# Patient Record
Sex: Female | Born: 1960 | Race: White | Marital: Married | Smoking: Current every day smoker
Health system: Northeastern US, Academic
[De-identification: ages and names within clinical notes are randomized; demographics above are authoritative.]

## PROBLEM LIST (undated history)

## (undated) DIAGNOSIS — M1990 Unspecified osteoarthritis, unspecified site: Secondary | ICD-10-CM

## (undated) DIAGNOSIS — G8929 Other chronic pain: Secondary | ICD-10-CM

## (undated) DIAGNOSIS — E7800 Pure hypercholesterolemia, unspecified: Secondary | ICD-10-CM

## (undated) DIAGNOSIS — K140 Glossitis: Secondary | ICD-10-CM

## (undated) DIAGNOSIS — M545 Low back pain, unspecified: Secondary | ICD-10-CM

## (undated) DIAGNOSIS — G35 Multiple sclerosis: Secondary | ICD-10-CM

## (undated) DIAGNOSIS — E039 Hypothyroidism, unspecified: Secondary | ICD-10-CM

## (undated) DIAGNOSIS — J45909 Unspecified asthma, uncomplicated: Secondary | ICD-10-CM

## (undated) DIAGNOSIS — I82409 Acute embolism and thrombosis of unspecified deep veins of unspecified lower extremity: Secondary | ICD-10-CM

## (undated) DIAGNOSIS — F32A Depression, unspecified: Secondary | ICD-10-CM

## (undated) DIAGNOSIS — R1310 Dysphagia, unspecified: Secondary | ICD-10-CM

## (undated) DIAGNOSIS — I519 Heart disease, unspecified: Secondary | ICD-10-CM

## (undated) DIAGNOSIS — K219 Gastro-esophageal reflux disease without esophagitis: Secondary | ICD-10-CM

## (undated) DIAGNOSIS — Z720 Tobacco use: Secondary | ICD-10-CM

## (undated) DIAGNOSIS — G35B0 Primary progressive multiple sclerosis, unspecified: Secondary | ICD-10-CM

## (undated) DIAGNOSIS — I10 Essential (primary) hypertension: Secondary | ICD-10-CM

## (undated) DIAGNOSIS — G35D Multiple sclerosis, unspecified: Secondary | ICD-10-CM

## (undated) DIAGNOSIS — E876 Hypokalemia: Secondary | ICD-10-CM

## (undated) DIAGNOSIS — J449 Chronic obstructive pulmonary disease, unspecified: Secondary | ICD-10-CM

## (undated) HISTORY — DX: Heart disease, unspecified: I51.9

## (undated) HISTORY — DX: Glossitis: K14.0

## (undated) HISTORY — PX: OTHER SURGICAL HISTORY: SHX169

## (undated) HISTORY — DX: Chronic obstructive pulmonary disease, unspecified: J44.9

## (undated) HISTORY — PX: TONSILLECTOMY AND ADENOIDECTOMY: SHX28

## (undated) HISTORY — PX: APPENDECTOMY: SHX54

## (undated) HISTORY — DX: Tobacco use: Z72.0

## (undated) HISTORY — DX: Gastro-esophageal reflux disease without esophagitis: K21.9

## (undated) HISTORY — DX: Unspecified osteoarthritis, unspecified site: M19.90

## (undated) HISTORY — DX: Hypothyroidism, unspecified: E03.9

## (undated) HISTORY — DX: Acute embolism and thrombosis of unspecified deep veins of unspecified lower extremity: I82.409

## (undated) HISTORY — PX: HIP REPLACEMENT: SHX530A

## (undated) HISTORY — DX: Dysphagia, unspecified: R13.10

## (undated) HISTORY — PX: COLONOSCOPY: SHX174

## (undated) HISTORY — PX: SPINE SURGERY: SHX786

## (undated) HISTORY — PX: VARICOSE VEIN SURGERY: SHX832

## (undated) HISTORY — DX: Multiple sclerosis: G35

## (undated) HISTORY — DX: Multiple sclerosis, unspecified: G35.D

---

## 1991-05-10 HISTORY — PX: OTHER SURGICAL HISTORY: SHX169

## 1999-09-08 ENCOUNTER — Encounter: Payer: Self-pay | Admitting: Gastroenterology

## 2007-08-15 ENCOUNTER — Encounter: Payer: Self-pay | Admitting: Cardiovascular Disease

## 2007-08-16 ENCOUNTER — Encounter: Payer: Self-pay | Admitting: Cardiovascular Disease

## 2009-03-31 ENCOUNTER — Encounter: Payer: Self-pay | Admitting: Cardiovascular Disease

## 2009-03-31 ENCOUNTER — Emergency Department: Admit: 2009-03-31 | Disposition: A | Discharge status: Home / Self Care | Payer: Self-pay | Source: Ambulatory Visit

## 2009-03-31 LAB — URINE MICROSCOPIC (IQ200)
RBC,UA: 1 /HPF (ref 0–2)
WBC,UA: 9 /HPF — AB (ref 0–5)

## 2009-03-31 LAB — CBC AND DIFFERENTIAL
Baso # K/uL: 0.1 THOU/uL (ref 0.0–0.1)
Basophil %: 0.7 % (ref 0.1–1.2)
Eos # K/uL: 0.3 THOU/uL (ref 0.0–0.4)
Eosinophil %: 3.6 % (ref 0.7–5.8)
Hematocrit: 39 % (ref 34–45)
Hemoglobin: 13.4 g/dL (ref 11.2–15.7)
Lymph # K/uL: 2.9 THOU/uL (ref 1.2–3.7)
Lymphocyte %: 39.2 % (ref 19.3–51.7)
MCV: 101 fL — ABNORMAL HIGH (ref 79–95)
Mono # K/uL: 0.4 THOU/uL (ref 0.2–0.9)
Monocyte %: 4.9 % (ref 4.7–12.5)
Neut # K/uL: 3.8 THOU/uL (ref 1.6–6.1)
Platelets: 317 THOU/uL (ref 160–370)
RBC: 3.9 MIL/uL (ref 3.9–5.2)
RDW: 13.5 % (ref 11.7–14.4)
Seg Neut %: 51.6 % (ref 34.0–71.1)
WBC: 7.3 THOU/uL (ref 4.0–10.0)

## 2009-03-31 LAB — PLASMA PROF 7 (ED ONLY)
Anion Gap,PL: 9 (ref 7–16)
CO2,Plasma: 22 mmol/L (ref 20–28)
Chloride,Plasma: 107 mmol/L (ref 96–108)
Creatinine: 0.58 mg/dL (ref 0.51–0.95)
GFR,Black: >59 *
GFR,Caucasian: >59 *
Glucose,Plasma: 89 mg/dL (ref 74–106)
Potassium,Plasma: 3.6 mmol/L (ref 3.4–4.7)
Sodium,Plasma: 138 mmol/L (ref 132–146)
UN,Plasma: 21 mg/dL — ABNORMAL HIGH (ref 6–20)

## 2009-03-31 LAB — URINALYSIS WITH REFLEX TO MICROSCOPIC
Blood,UA: NEGATIVE
Ketones, UA: NEGATIVE
Nitrite,UA: NEGATIVE
Protein,UA: NEGATIVE mg/dL
Specific Gravity,UA: 1.023 (ref 1.002–1.030)
pH,UA: 5.5 (ref 5.0–8.0)

## 2009-03-31 LAB — DRUG SCREEN CHEMICAL DEPENDENCY, URINE
Amphetamine,UR: NEGATIVE
Benzodiazepinen,UR: NEGATIVE
Cocaine/Metab,UR: NEGATIVE
Opiates,UR: POSITIVE
THC Metabolite,UR: NEGATIVE

## 2009-03-31 LAB — RUQ PANEL (ED ONLY)
ALT: 22 U/L (ref 0–35)
AST: 18 U/L (ref 0–35)
Albumin: 4.5 g/dL (ref 3.5–5.2)
Alk Phos: 57 U/L (ref 35–105)
Amylase: 38 U/L (ref 28–100)
Bilirubin,Direct: <0.2 mg/dL (ref 0.0–0.3)
Bilirubin,Total: 0.4 mg/dL (ref 0.0–1.2)
Lipase: 37 U/L (ref 13–60)
Total Protein: 6.6 g/dL (ref 6.3–7.7)

## 2009-03-31 LAB — ACETAMINOPHEN LEVEL: Acetaminophen: <1 ug/mL

## 2009-03-31 LAB — POCT URINE PREGNANCY (ED ONLY): Preg,UR POCT: NEGATIVE m[IU]/mL

## 2009-03-31 LAB — SALICYLATE LEVEL: Salicylate: <2 mg/dL — ABNORMAL LOW (ref 15.0–30.0)

## 2009-03-31 LAB — ETHANOL: Ethanol: <10 mg/dL

## 2009-04-01 LAB — CONFIRM OPIATES: Confirm Opiates: POSITIVE

## 2009-04-01 LAB — HOLD LAVENDER

## 2009-04-01 LAB — HOLD RED: Hold Red: 1

## 2009-04-01 LAB — SALICYLATE LEVEL: Salicylate: <2 mg/dL — ABNORMAL LOW (ref 15.0–30.0)

## 2009-04-01 LAB — ACETAMINOPHEN LEVEL: Acetaminophen: <1 ug/mL

## 2009-04-07 NOTE — ED Provider Notes (Signed)
VISIT NUMBER:  [id number].    I saw and evaluated the patient.  I agree with the resident's/fellow's  finding and plan of care as documented.  Details of my evaluation are as  follows:    CHIEF COMPLAINT:  Fatigue.    HPI:  A 48 year old arrived at the ER fatigued and somnolent.  She was at  work upstairs in the ICU.  She stated to myself that she felt fine this  morning on her way into work.  She felt fine last night.  She was quite  sleepy, falling asleep mid sentences, and able to answer most questions  with short answers.  She denies any understanding why she is so sleepy.  She denies any ingestions, specifically denies any could or flu  medications.    The was an unopened bottle of IV lorazepam in her pocket.  Other details  about work exposure and work medications are unknown to myself.    She does share with myself that she lives at home with husband and 2  stepchildren.  She shares that her husband is away in [place] and can  be reached at [phone].  I tried the above number unsuccessfully.    ALLERGIES:  To Compazine.  MEDICATIONS:  After sharing, "I'm trying to think," she shared that there  are no prescription medications.    PAST MEDICAL / SURGICAL HISTORY:  Perianal repair, colostomy status post  reversal in 1993, chronic hypokalemia, hypertension, osteoarthritis,  hypercholesterolemia, and chronic low back pain.    FAMILY HISTORY:  None.    SOCIAL HISTORY:  Two-pack per day tobacco.  No street drugs.  As mentioned,  lives with her husband and 2 children; unknown what age the children are.    REVIEW OF SYSTEMS:  Positive for the above, otherwise negative x10.    PHYSICAL EXAMINATION:  Temperature 36.3.  Respiratory rate 18.  Pulse 74.  Blood pressure 107/74.  Pulse oximetry 97% on 2 L.  General:  The patient is quite somnolent and difficult to arouse.  At best,  she is able to lean up on 1 elbow, open her eyes to speech, and try to  cooperate with the physical.  Head:  Normocephalic.  Eyes:  Pupils  equal to light, react.  Oropharynx:  Dry.  Neck:  Supple.  Lungs:  Clear to auscultation, unlabored.  Heart:  S1, S2 with no murmurs, rubs, gallops or heaves.  Extremities:  Warm and perfused.  Abdomen:  Soft, nondistended, nontender, no masses, no guarding, no  rigidity.  Extremities:  Moves all extremities well with no edema.  Neurologic:  Moves extremities spontaneously and well.  Psychiatric:  Alert with normal affect and normal mood.  Skin:  I am unable to check any track marks on her hands or forearms.    MEDICAL DECISION MAKING/CONDITION:  Labs pending, including a CBC, Chem-7,  right upper quadrant, urine drug abuse screen, aspirin, Tylenol, EtOH, and  head CT.    Currently, we are treating this as a presumed ingestion.  It will be  helpful to see if the opioid and/or benzodiazepines are detectable, as  either of these could be a cause for her somnolence.  I will be attempting  some Narcan to see if this has any effect.  If it does, it will be noted in  an addendum.    I will be readdressing her issues in an addendum when the labs and images  come back.    We also will be addressing her  mental status unless a reversible and  identifiable cause is discovered.  She is not safe to drive herself home  and at present, is not safe to consent to go home.  DDX:  PLAN:    DATA:  LABS:  RADIOLOGIC STUDIES:  ECG:  RHYTHM STRIPS:    PROCEDURE (Attestation):  CRITICAL CARE TIME (Time to perform separately billable procedures  subtracted from CC time):  CONSULT:  PCP NOTIFIED:  SMOKING CESSATION COUNSELING:    FOLLOW-UP NOTE AND DISPOSITION:    ED DIAGNOSIS:  Somnolence.                                      Electronically Signed and Finalized  by  Danie Binder, MD 04/07/2009 00:39  ___________________________________________  Danie Binder, MD      DD:   03/31/2009  DT:   03/31/2009  2:23 P  GN/FA2#[id number]  [id number]    cc:   Kateri Plummer, MD

## 2013-10-20 ENCOUNTER — Other Ambulatory Visit: Payer: Self-pay | Admitting: Gastroenterology

## 2013-10-20 ENCOUNTER — Encounter: Payer: Self-pay | Admitting: Anesthesiology

## 2013-10-20 ENCOUNTER — Emergency Department
Admission: EM | Admit: 2013-10-20 | Disposition: A | Discharge status: Home / Self Care | Payer: Self-pay | Source: Ambulatory Visit | Attending: Emergency Medicine | Admitting: Emergency Medicine

## 2013-10-20 HISTORY — DX: Unspecified osteoarthritis, unspecified site: M19.90

## 2013-10-20 HISTORY — DX: Low back pain, unspecified: M54.50

## 2013-10-20 HISTORY — DX: Essential (primary) hypertension: I10

## 2013-10-20 HISTORY — DX: Other chronic pain: G89.29

## 2013-10-20 HISTORY — DX: Pure hypercholesterolemia, unspecified: E78.00

## 2013-10-20 HISTORY — DX: Hypokalemia: E87.6

## 2013-10-20 LAB — CBC AND DIFFERENTIAL
Baso # K/uL: 0.1 10*3/uL (ref 0.0–0.1)
Basophil %: 0.7 % (ref 0.1–1.2)
Eos # K/uL: 0.1 10*3/uL (ref 0.0–0.4)
Eosinophil %: 1.1 % (ref 0.7–5.8)
Hematocrit: 44 % (ref 34–45)
Hemoglobin: 14.2 g/dL (ref 11.2–15.7)
Lymph # K/uL: 3.5 10*3/uL (ref 1.2–3.7)
Lymphocyte %: 47.4 % (ref 19.3–51.7)
MCH: 33 pg/cell — ABNORMAL HIGH (ref 26–32)
MCHC: 33 g/dL (ref 32–36)
MCV: 102 fL — ABNORMAL HIGH (ref 79–95)
Mono # K/uL: 0.5 10*3/uL (ref 0.2–0.9)
Monocyte %: 7.3 % (ref 4.7–12.5)
Neut # K/uL: 3.2 10*3/uL (ref 1.6–6.1)
Platelets: 359 10*3/uL (ref 160–370)
RBC: 4.3 MIL/uL (ref 3.9–5.2)
RDW: 13.8 % (ref 11.7–14.4)
Seg Neut %: 43.5 % (ref 34.0–71.1)
WBC: 7.4 10*3/uL (ref 4.0–10.0)

## 2013-10-20 LAB — SALICYLATE LEVEL: Salicylate: 2.7 mg/dL — ABNORMAL LOW (ref 15.0–30.0)

## 2013-10-20 LAB — PLASMA PROF 7 (ED ONLY)
Anion Gap,PL: 16 (ref 7–16)
CO2,Plasma: 24 mmol/L (ref 20–28)
Chloride,Plasma: 107 mmol/L (ref 96–108)
Creatinine: 0.57 mg/dL (ref 0.51–0.95)
GFR,Black: 122 *
GFR,Caucasian: 106 *
Glucose,Plasma: 95 mg/dL (ref 60–99)
Potassium,Plasma: 4 mmol/L (ref 3.4–4.7)
Sodium,Plasma: 147 mmol/L — ABNORMAL HIGH (ref 132–146)
UN,Plasma: 13 mg/dL (ref 6–20)

## 2013-10-20 LAB — ETHANOL: Ethanol: 166 mg/dL

## 2013-10-20 LAB — ACETAMINOPHEN LEVEL: Acetaminophen: <2 ug/mL

## 2013-10-20 MED ORDER — SODIUM CHLORIDE 0.9 % IV BOLUS *I*
1000.0000 mL | Freq: Once | Status: DC
Start: 2013-10-20 — End: 2013-10-20
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
## 2013-10-20 NOTE — Discharge Instructions (Signed)
You were seen today for an abrasion to the left knee and concerns for your safety at home. You decided to proceed with discharge from the hospital without further imaging or counseling. Please return if you have symptoms of increasing pain, numbness, or weakness in your left leg.    Return to the Emergency Department or notify your primary care physician for symptoms that persist or worsen.    Please return to the Emergency Department for any concerns if your primary care physician cannot be reached.    If you need a referral for a primary care provider, call the [facility] referral number at [phone].
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
## 2013-10-20 NOTE — Progress Notes (Signed)
SW made aware of patient by nursing. Patient has denied the need for social work and is currently intoxicated. Plan is for patient to be seen by [facility] at this time.   Vanessa Barbara, LMSW  10/20/2013  3:43 AM
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
## 2013-10-21 LAB — EKG 12-LEAD
P: 64 degrees
QRS: 34 degrees
Rate: 63 {beats}/min
Severity: ABNORMAL
Severity: ABNORMAL
Statement: ABNORMAL
T: 65 degrees

## 2014-04-27 DIAGNOSIS — T1490XA Injury, unspecified, initial encounter: Secondary | ICD-10-CM | POA: Insufficient documentation

## 2014-11-04 ENCOUNTER — Encounter: Payer: Self-pay | Admitting: Gastroenterology

## 2014-11-06 ENCOUNTER — Encounter: Payer: Self-pay | Admitting: Gastroenterology

## 2014-11-07 ENCOUNTER — Other Ambulatory Visit: Payer: Self-pay | Admitting: Vascular Surgery

## 2014-11-12 ENCOUNTER — Ambulatory Visit: Payer: Self-pay | Admitting: Vascular Surgery

## 2014-11-12 ENCOUNTER — Encounter: Payer: Self-pay | Admitting: Vascular Surgery

## 2014-11-12 VITALS — BP 170/77 | Wt 149.6 lb

## 2014-11-12 DIAGNOSIS — I749 Embolism and thrombosis of unspecified artery: Secondary | ICD-10-CM

## 2014-11-12 NOTE — Progress Notes (Signed)
Julia Burgess is a pleasant 54 y.o. female who is being seen in discharge from [facility] after an embolic event to her left 2nd digit and a concern for a large thrombus in her ascending aorta on and admission CT scan that was not present on an MRI. She has been placed on anticoagulation with xarelto and denies an bleeding complications. She notes continued pain in the finger and is concerned that the finger could result in an amputation. She continues to keep the finger dry and warm and has been taking all discharge medications as prescribed. She notes she continues to smoke but has been trying to quit.    Patient denies any history of calf, thigh, or buttock claudication. She denies any abdominal or back pain or unclear etiology. She denies family history for aneurysmal disease. Known risk factors for embolization include: hyperlipidemia, coronary artery disease, smoking history.    Patient's medications, allergies, and medical, surgical, family, and social histories were updated, as appropriate, in eRecord during today's office visit.    Review of Systems - Pertinent items noted in HPI.    Past Medical History   Diagnosis Date    Chronic hypokalemia     Chronic low back pain     Hypercholesterolemia     Hypertension     Osteoarthritis      Past Surgical History   Procedure Laterality Date    Perianal repair      Colostomy reversal  1993     No family history on file.  Social History   Substance Use Topics    Smoking status: Current Every Day Smoker     Packs/day: 1.50     Types: Cigarettes     Start date: 04/08/1980    Smokeless tobacco: None    Alcohol use Yes     No Known Allergies (drug, envir, food or latex)    Current Outpatient Prescriptions:     Acetaminophen (APAP) 325 MG tablet, Take 1,000 mg by mouth daily, Disp: , Rfl:     aspirin 81 MG EC tablet, Take 81 mg by mouth daily, Disp: , Rfl:     levothyroxine (SYNTHROID, LEVOTHROID) 100 MCG tablet, Take 100 mcg by mouth daily (before  breakfast), Disp: , Rfl:     rivaroxaban (XARELTO) 15 MG tablet, Take 15 mg by mouth 2 times daily (with meals), Disp: , Rfl:     albuterol HFA 108 (90 BASE) MCG/ACT inhaler, Inhale 1-2 puffs into the lungs every 6 hours as needed for Wheezing   Shake well before each use., Disp: , Rfl:     pantoprazole (PROTONIX) 40 MG EC tablet, Take 40 mg by mouth daily   Swallow whole. Do not crush, break, or chew., Disp: , Rfl:     HYDROcodone-acetaminophen (NORCO) 10-325 MG per tablet, Take 1 tablet by mouth every 6 hours as needed for Pain, Disp: , Rfl:     Physical Exam:  Visit Vitals    BP 170/77 (BP Location: Right arm, Patient Position: Sitting, Cuff Size: adult)    Wt 67.9 kg (149 lb 9.6 oz)    BMI 28.27 kg/m2       General Appearance:    Alert, cooperative, no distress, appears stated age.   Head:    Normocephalic, without obvious abnormality, atraumatic.   Neck:   Supple, symmetrical; no carotid bruit.   Lungs:     Clear to auscultation bilaterally, respirations unlabored.    Heart:    Regular rate and rhythm.  Abdomen:     Soft, non-tender.   Extremities:   Left 2nd index figure blush discoloration painful to the touch    Phasic digital doppler signals until the PIP    Slow cap refill    Decreased sensation   Pulses:   Radial, DP, and PT pulses are palpable and symmetric.    Skin:   Skin color, texture, turgor normal, no rashes or lesions.   Neurologic:   Grossly intact, normal strength and sensation throughout.     Assessment: 54 y.o. female with of embolism to the distal 2nd digit of the left hand.    Plan:    I have discussed with the patient given the distal nature of the emobolization their is no surgery that could be offered for this problem.   We will continue maximal medical therapy and observation of the wound    Continue antiplatelet therapy with ASA 81mg  and and anticoagulation Xarelto    Protect the feet and toes from trauma and extremes of temperature.   RTC in two weeks to monitor the  wound   Phone clinic for sooner evaluation with any worsening symptoms or concern for new embolization or pain.    Alease Medina, MD 11/12/2014    Patient was seen in collaboration with Dr. Alease Medina. Agree with the note as stated above.    Wendelyn Breslow, NP

## 2014-11-13 ENCOUNTER — Encounter: Payer: Self-pay | Admitting: Hematology and Oncology

## 2014-11-13 ENCOUNTER — Ambulatory Visit: Payer: Self-pay | Admitting: Hematology and Oncology

## 2014-11-13 VITALS — BP 161/95 | HR 70 | Temp 98.1°F | Resp 18 | Ht 60.98 in | Wt 148.8 lb

## 2014-11-13 DIAGNOSIS — I82409 Acute embolism and thrombosis of unspecified deep veins of unspecified lower extremity: Secondary | ICD-10-CM
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
## 2014-11-14 LAB — ANTITHROMBIN III: Antithrombin III: 149 % — ABNORMAL HIGH (ref 81–125)

## 2014-11-14 LAB — PATIENT CONSENT

## 2014-11-14 LAB — PROTEIN S: Protein S: >150 % — ABNORMAL HIGH (ref 51–140)

## 2014-11-14 LAB — ACT PROT C REST: Activated Protein C Resistance: 3.1

## 2014-11-14 LAB — PROTEIN C: Protein C: 155 % — ABNORMAL HIGH (ref 77–147)

## 2014-11-14 LAB — ANTI-CARDIOLIPIN AB
Anticardiolipin IgG: 2 [GPL'U]/mL (ref 0–14)
Anticardiolipin IgM: 4 [MPL'U]/mL (ref 0–12)

## 2014-11-14 LAB — HOMOCYSTEINE: Homocysteine: 11 umol/L (ref 0–15)

## 2014-11-17 LAB — HYPERCOAGULATION PROFILE
Fibrinogen: 112 mg/dL — ABNORMAL LOW (ref 172–409)
INR: 1.3 — ABNORMAL HIGH (ref 1.0–1.2)
Protime: 14.2 s — ABNORMAL HIGH (ref 9.2–12.3)
aPTT: 31.9 s (ref 25.8–37.9)

## 2014-11-17 LAB — SPEC COAG REVIEW

## 2014-11-20 LAB — FVL REVIEW

## 2014-11-20 LAB — PROTHROMBIN GENE: Genotype: NORMAL

## 2014-11-20 LAB — FACTOR V LEIDEN: Genotype: NORMAL

## 2014-11-20 LAB — PTG REVIEW

## 2014-11-28 ENCOUNTER — Ambulatory Visit: Payer: Self-pay | Admitting: Vascular Surgery

## 2014-12-04 ENCOUNTER — Ambulatory Visit: Payer: Self-pay | Admitting: Hematology and Oncology

## 2014-12-15 ENCOUNTER — Encounter: Payer: Self-pay | Admitting: Vascular Surgery

## 2014-12-15 NOTE — Progress Notes (Signed)
No show

## 2015-01-02 ENCOUNTER — Encounter: Payer: Self-pay | Admitting: Hematology and Oncology

## 2015-01-02 ENCOUNTER — Other Ambulatory Visit: Payer: Self-pay | Admitting: Hematology and Oncology

## 2015-01-02 ENCOUNTER — Ambulatory Visit: Payer: Self-pay | Admitting: Hematology and Oncology

## 2015-01-02 VITALS — BP 148/94 | HR 69 | Temp 98.5°F | Resp 18 | Ht 60.98 in | Wt 147.4 lb

## 2015-01-02 DIAGNOSIS — G514 Facial myokymia: Secondary | ICD-10-CM

## 2015-01-02 LAB — COMPREHENSIVE METABOLIC PANEL
ALT: 21 U/L^U/L (ref 12–78)
AST: 12 U/L^U/L (ref 11–37)
Albumin: 4.1 g/dL^g/dL (ref 3.4–5.0)
Alk Phos: 58 U/L^U/L (ref 45–117)
Anion Gap: 10 (ref 3–11)
Bilirubin,Total: 0.3 mg/dL^mg/dL (ref 0.2–1.0)
CO2: 24 meq/L^meq/L (ref 21–32)
Calcium: 8.8 mg/dL^mg/dL (ref 8.5–10.1)
Chloride: 106 meq/L^meq/L (ref 98–107)
Creatinine: 0.6 mg/dL^mg/dL (ref 0.6–1.3)
GFR,Black: 126 mL/min/{1.73_m2}
GFR,Caucasian: 104 mL/min/{1.73_m2}
Glucose: 92 mg/dL^mg/dL (ref 70–100)
Lab: 12 mg/dL^mg/dL (ref 7–18)
Potassium: 3.9 meq/L^meq/L (ref 3.5–5.1)
Sodium: 140 meq/L^meq/L (ref 136–145)
Total Protein: 7.1 g/dL^g/dL (ref 6.4–8.2)

## 2015-01-02 LAB — MAGNESIUM: Magnesium: 2.1 mg/dL^mg/dL (ref 1.8–2.8)

## 2015-01-02 NOTE — Progress Notes (Signed)
Chief Complaint: Thrombophilia    HPI: Female Julia Burgess is a 54 year old female who is being seen  after an embolic event to her left 2nd digit and a concern for a large thrombus in her ascending aorta on and admission CT scan that was not present on an MRI. She is now on xarelto and ASA. She tells me that she has osteoarthritis and has been taking 650mg  of ASA BID for her pain. 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 She has been on lovenox in the past for her DVT's and ASA for her superficial clots. She denies any family history of blood clots. She also has some pain in her LUE which has been evaluated on 11/04/14 with UE doppler which was negative. She denies any spontaneous abortions or bleeding. No fevers, chills, unexplained weight loss. She is up to date on mammograms and is not sure about colonoscopy.    [name] returns today to review her hypercoagulable panel.  Overall, she continues with pain in her left arm.  She sees her primary care physician regarding this.  She has follow-up with him in September.  However, over the last 4 or 5 days she has noticed twitching on the entire left side of her face.  It includes around her eye and also around her mouth.  She denies any other neurologic issues.    Patient's vitals, pain score, medications, allergies, past medical, surgical, social and family histories were reviewed and updated as appropriate.    Review of Systems   Constitutional: Negative.    HENT: Negative.    Eyes: Negative.    Respiratory: Negative.    Cardiovascular: Negative.    Gastrointestinal: Negative.    Endocrine: Negative.    Musculoskeletal:        Left arm pain   Skin: Negative.    Allergic/Immunologic: Negative.    Neurological: Positive for tremors.   Hematological: Negative.    Psychiatric/Behavioral: Negative.        Physical Exam   Constitutional: She is oriented to person, place, and time. She appears well-developed and  well-nourished.   HENT:   Head: Normocephalic and atraumatic.   Mouth/Throat: Oropharynx is clear and moist.   Eyes: Conjunctivae and EOM are normal. Pupils are equal, round, and reactive to light. No scleral icterus.   Neck: Normal range of motion. Neck supple.   Cardiovascular: Normal rate and regular rhythm.    Pulmonary/Chest: Breath sounds normal.   Abdominal: Soft. Bowel sounds are normal. She exhibits no distension. There is no tenderness. There is no rebound and no guarding.   Musculoskeletal: Normal range of motion. She exhibits no edema.   Lymphadenopathy:     She has no cervical adenopathy.   Neurological: She is alert and oriented to person, place, and time. No cranial nerve deficit.   Cranial nerves are grossly intact.  However she had twitching that included the entire left side of her face.     Skin: Skin is warm and dry. No rash noted. No erythema.   Psychiatric: She has a normal mood and affect. Her behavior is normal. Judgment and thought content normal.   Vitals reviewed.      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  We have not found an abnormality in her right upper choroidal panel, but she is clearly  quite hypercoagulable.  I had a discussion with her and recommended lifelong anticoagulation.  Any agent would be reasonable to accomplish this that she has not had a thrombosis while on anticoagulation to the best of my knowledge.  The patient understands.    Regarding her facial twitching, the cause is unclear.  I will check a chemistry panel including a magnesium.  I have discussed the situation with her primary care physician and they will refer her to neurology.

## 2015-02-17 ENCOUNTER — Other Ambulatory Visit: Payer: Self-pay | Admitting: Gastroenterology

## 2015-03-23 ENCOUNTER — Encounter: Payer: Self-pay | Admitting: Otolaryngology

## 2015-03-23 ENCOUNTER — Ambulatory Visit: Payer: Self-pay | Admitting: Otolaryngology

## 2015-03-23 VITALS — BP 139/82 | HR 71 | Temp 97.4°F | Ht 61.0 in | Wt 145.0 lb

## 2015-03-23 DIAGNOSIS — D101 Benign neoplasm of tongue: Secondary | ICD-10-CM

## 2015-03-23 NOTE — Progress Notes (Signed)
Julia Burgess was referred by Dr. Theda Sers.    Subjective  Chief Complaint: She presents today for base of tongue anomaly.    HPI: This is a 54 y.o. old female, who underwent CT angio of the neck on 03/02/2015 as part of her workup for an unidentified clotting disorder / hypercoagulability.  On that scan noted to have some asymmetry in the base of tongue.  No pain.  No fever, night sweats or weight loss.   Slow change in voice over the years.  Active smoker.      Outpatient Prescriptions Marked as Taking for the 03/23/15 encounter (Office Visit) with Dayton Scrape, MD   Medication Sig Dispense Refill    Acetaminophen (APAP) 325 MG tablet Take 1,000 mg by mouth every 6 hours as needed for Pain         levothyroxine (SYNTHROID, LEVOTHROID) 100 MCG tablet Take 100 mcg by mouth daily (before breakfast)      rivaroxaban (XARELTO) 15 MG tablet Take 20 mg by mouth daily         albuterol HFA 108 (90 BASE) MCG/ACT inhaler Inhale 1-2 puffs into the lungs every 6 hours as needed for Wheezing   Shake well before each use.      pantoprazole (PROTONIX) 40 MG EC tablet Take 40 mg by mouth daily   Swallow whole. Do not crush, break, or chew.      gabapentin (NEURONTIN) 300 MG capsule Take 1-2 capsules QHS for 5-7 days.  If tolerated, may increase to 1 capsule BID and 2 capsules at QHS. (Patient taking differently: Take 900 mg by mouth 2 times daily   ) 120 2       Medication allergies: Bee venom and Compazine [prochlorperazine]    Problem List:  does not have a problem list on file.    Medical History:   Past Medical History   Diagnosis Date    Chronic hypokalemia     Chronic low back pain     Hypercholesterolemia     Hypertension     Osteoarthritis     Tobacco abuse        Surgical History:   Past Surgical History   Procedure Laterality Date    Perianal repair      Colostomy reversal  1993    Appendectomy      Varicose vein surgery      Tonsillectomy and adenoidectomy          Family History: family history  includes Cancer in her maternal grandmother and paternal grandmother.    Social History:   Social History   Substance Use Topics    Smoking status: Current Every Day Smoker     Packs/day: 1.00     Years: 30.00     Types: Cigarettes     Start date: 04/08/1980    Smokeless tobacco: Not on file    Alcohol use Yes      Comment: occas       ROS: ENT ROS: as above    Objective  Vitals:    03/23/15 0826   BP: 139/82   Pulse: 71   Temp: 36.3 C (97.4 F)   Weight: 65.8 kg (145 lb)   Height: 1.549 m (5\' 1" )         Exam  Constitution:  Appears well developed and well nourished. No signs of acute distress present. Patient is cooperative and overall behavior is appropriate.    Head / Face: Atraumatic, normocephalic on inspection.  Eyes:  Conjunctivae clear. No periorbital edema of the upper and lower lids. Sclerae clear.    Ears: Inspection reveals no lesion of the ears, swelling of the ears, or tenderness of the ears.  No discharge, mass or stenosis in the auditory canals.     TM's - translucent, normal landmarks, and no retraction. No swelling or tenderness of the post auricular area bilaterally.    Nose: External Nose: exhibits no deformity or lesions. Internal Nose: No discharge from the nasal mucosae, no edema, no erythema of the nasal mucosae. Nasal Septum: not significantly deviated or perforated. Nasal turbinates are normal in color and size.    Oral cavity: Lips appear normal and healthy. Dentition is normal for age. Gums appear healthy. Tongue shows a smooth surface and symmetry. Floor of the mouth appears normal. Salivary glands normal in size with no asymmetry. Submandibular and parotid ducts are patent bilaterally. Oral mucosa moist with no thrush and no mucositis. Hard palate normal in appearance.    Oropharynx: No lesions or masses. Soft palate normal in appearance. Uvula midline and normal in size. Tonsils palatine tonsils are absent. Posterior pharyngeal mucosa appears normal.    Neck: Symmetric. Palpation  reveals no swelling or tenderness. No masses appreciated. Trachea is midline and has good landmarks. Thyroid exhibits no palpable enlargement, nodules or tenderness on palpation.    Neurological: Alert and oriented x 3.    Psychological: Mood is normal. Patient's affect is appropriate to mood. Speech is spontaneous with regular rate, rhythm, and volume.     Flexible nasopharyngoscopy is carried out after topical afrin and lidocaine to each nostril - normal nasal cavity, nasopharynx, and larynx.  Normal tongue base appearance overall with asymmetry / prominence of the right sided BOT lymphoid tissue.  Normal bilateral true vocal cord motion.  No mass lesions identified.  Photos taken.    CT neck - prominence of base of tongue on the right, normal fat planes.  No adenopathy.        Assessment    ICD-10-CM ICD-9-CM    1. 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  Will follow.  Recheck with repeat NPL in 3 to 4 months.      Seen by : Dayton Scrape, MD 03/23/2015

## 2015-06-19 ENCOUNTER — Ambulatory Visit: Payer: Self-pay | Admitting: Orthopedic Surgery

## 2015-06-24 ENCOUNTER — Ambulatory Visit: Payer: Self-pay | Admitting: Otolaryngology

## 2015-06-26 ENCOUNTER — Ambulatory Visit: Payer: Self-pay | Admitting: Orthopedic Surgery

## 2015-06-30 ENCOUNTER — Telehealth: Payer: Self-pay | Admitting: Otolaryngology

## 2015-06-30 NOTE — Telephone Encounter (Signed)
LVM for patient to call back if they would like to cancel/reschedule an appointment with [place]. Called patient at [phone].

## 2015-06-30 NOTE — Telephone Encounter (Signed)
Spoke w/patient resch appt for 07/13/15 at 11am

## 2015-06-30 NOTE — Telephone Encounter (Signed)
Patient called stating she is out of town and will need to reschedule appointment for tomorrow. Patient would like a call back to schedule at [phone]

## 2015-07-01 ENCOUNTER — Ambulatory Visit: Payer: Self-pay | Admitting: Otolaryngology

## 2015-07-13 ENCOUNTER — Encounter: Payer: Self-pay | Admitting: Otolaryngology

## 2015-07-13 ENCOUNTER — Ambulatory Visit: Payer: Self-pay | Admitting: Otolaryngology

## 2015-07-13 VITALS — BP 145/96 | HR 75

## 2015-07-13 DIAGNOSIS — D101 Benign neoplasm of tongue: Secondary | ICD-10-CM

## 2015-07-13 NOTE — Progress Notes (Addendum)
Julia Burgess was referred by Dr. Theda Burgess.    Subjective  Chief Complaint: She presents today for follow up.    HPI: This is a 55 y.o. old female, who comes in for recheck of he right BOT.  No new complaints for this issue and is asymptomatic.  Recently diagnosed with Multiple Sclerosis, followed by Dr. Manus Burgess.      Outpatient Prescriptions Marked as Taking for the 07/13/15 encounter (Office Visit) with Julia Scrape, MD   Medication Sig Dispense Refill    dronabinol (MARINOL) 2.5 MG capsule       COPAXONE 40 MG/ML prefilled syringe       XARELTO 20 MG tablet       levalbuterol (XOPENEX HFA) 45 MCG/ACT inhaler       Acetaminophen (APAP) 325 MG tablet Take 1,000 mg by mouth every 6 hours as needed for Pain         aspirin 81 MG EC tablet Take 81 mg by mouth daily         levothyroxine (SYNTHROID, LEVOTHROID) 100 MCG tablet Take 100 mcg by mouth daily (before breakfast)      pantoprazole (PROTONIX) 40 MG EC tablet Take 40 mg by mouth daily   Swallow whole. Do not crush, break, or chew.      HYDROcodone-acetaminophen (NORCO) 10-325 MG per tablet Take 1 tablet by mouth every 6 hours as needed for Pain      gabapentin (NEURONTIN) 300 MG capsule Take 1-2 capsules QHS for 5-7 days.  If tolerated, may increase to 1 capsule BID and 2 capsules at QHS. (Patient taking differently: Take 900 mg by mouth 2 times daily   ) 120 2       Medication allergies: Bee venom and Compazine [prochlorperazine]    Problem List:  does not have a problem list on file.    Medical History:   Past Medical History:   Diagnosis Date    Chronic hypokalemia     Chronic low back pain     Hypercholesterolemia     Hypertension     Osteoarthritis     Tobacco abuse        Surgical History:   Past Surgical History:   Procedure Laterality Date    APPENDECTOMY      colostomy reversal  1993    perianal repair      TONSILLECTOMY AND ADENOIDECTOMY      VARICOSE VEIN SURGERY          Family History: family history includes Cancer in her maternal  grandmother and paternal grandmother.    Social History:   Social History   Substance Use Topics    Smoking status: Current Every Day Smoker     Packs/day: 1.00     Years: 30.00     Types: Cigarettes     Start date: 04/08/1980    Smokeless tobacco: Not on file    Alcohol use Yes      Comment: occas       ROS: ENT ROS: as above    Objective  Vitals:    07/13/15 1106   BP: (!) 145/96   Pulse: 75             Exam  Constitution:  Appears well developed and well nourished. No signs of acute distress present. Patient is cooperative and overall behavior is appropriate.    Head / Face: Atraumatic, normocephalic on inspection.    Eyes:  Conjunctivae clear. No periorbital edema of the upper and lower  lids. Sclerae clear.    Ears: Inspection reveals no lesion of the ears, swelling of the ears, or tenderness of the ears.  No discharge, mass or stenosis in the auditory canals.     TM's - translucent, normal landmarks, and no retraction. No swelling or tenderness of the post auricular area bilaterally.    Nose: External Nose: exhibits no deformity or lesions. Internal Nose: No discharge from the nasal mucosae, no edema, no erythema of the nasal mucosae. Nasal Septum: not significantly deviated or perforated. Nasal turbinates are normal in color and size.    Oral cavity: Lips appear normal and healthy. Dentition is normal for age. Gums appear healthy. Tongue shows a smooth surface and symmetry. Floor of the mouth appears normal. Salivary glands normal in size with no asymmetry. Submandibular and parotid ducts are patent bilaterally. Oral mucosa moist with no thrush and no mucositis. Hard palate normal in appearance.    Oropharynx: No lesions or masses. Soft palate normal in appearance. Uvula midline and normal in size. Tonsils appear normal. Posterior pharyngeal mucosa appears normal.    Neck: Symmetric. Palpation reveals no swelling or tenderness. No masses appreciated. Trachea is midline and has good landmarks. Thyroid  exhibits no palpable enlargement, nodules or tenderness on palpation.    Neurological: Alert and oriented x 3.    Psychological: Mood is normal. Patient's affect is appropriate to mood. Speech is spontaneous with regular rate, rhythm, and volume.     Flexible nasopharyngoscopy is carried out after topical afrin and lidocaine to each nostril - normal nasal cavity, nasopharynx, hypopharynx and larynx.  Tongue base shows prominent lymphatic tissue on the right without change.  Normal bilateral true vocal cord motion.  No mass lesions identified.        Assessment    ICD-10-CM ICD-9-CM    1. 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  She reports that she will be having another brain MRI in early April.  I would like to see if we could include cuts down through her base of tongue as well.  Will contact Dr. Manus Burgess.  The office will call with the results and give further disposition at that time.     Seen by : Julia Scrape, MD 07/13/2015

## 2015-07-21 ENCOUNTER — Other Ambulatory Visit: Payer: Self-pay | Admitting: Otolaryngology

## 2015-07-21 DIAGNOSIS — D101 Benign neoplasm of tongue: Secondary | ICD-10-CM

## 2015-08-07 ENCOUNTER — Other Ambulatory Visit: Payer: Self-pay | Admitting: Gastroenterology

## 2015-08-10 ENCOUNTER — Other Ambulatory Visit: Payer: Self-pay | Admitting: Gastroenterology

## 2015-08-13 ENCOUNTER — Other Ambulatory Visit: Payer: Self-pay | Admitting: Gastroenterology

## 2015-08-14 ENCOUNTER — Telehealth: Payer: Self-pay | Admitting: Otolaryngology

## 2015-08-14 NOTE — Telephone Encounter (Signed)
MRI results obtained and to Dr. Keturah Barre. Mailbox for review on Tuesday 08/18/15.

## 2015-08-14 NOTE — Telephone Encounter (Signed)
Patient calling to see if she could get MRI results done on Monday 08/10/15. She had it done at [facility]. Dr. Maureen Chatters wanted MRI of neck/base of tongue. She also had another MRI done ordered by Dr. Manus Rudd? DR Decicco has been in contact with Dr. Manus Rudd. Patient's last appointment was 07/13/15, does not have a f/up appointment. I told patient Dr. Maureen Chatters will not be in the office until Tuesday to review MRI.

## 2015-08-18 NOTE — Telephone Encounter (Signed)
Line busy, I will try again

## 2015-08-18 NOTE — Telephone Encounter (Signed)
The MRI shows no changes.  I would like to see her again in 6 months.

## 2015-08-18 NOTE — Telephone Encounter (Signed)
Line busy

## 2015-08-19 NOTE — Telephone Encounter (Signed)
Line is still busy

## 2015-08-20 NOTE — Telephone Encounter (Signed)
I can't get through to the patient's phone, I will send her a letter asking her to call our office for test results.

## 2015-08-31 NOTE — Telephone Encounter (Signed)
Patient called back for MRI results, I spoke to her and told her the MRI showed no changes, she voiced understanding.    She made a 6 month follow up visit.

## 2016-03-01 ENCOUNTER — Ambulatory Visit: Payer: Self-pay | Admitting: Otolaryngology

## 2016-03-04 ENCOUNTER — Ambulatory Visit: Payer: Self-pay | Admitting: Otolaryngology

## 2016-07-16 IMAGING — CT CT CHEST-ABD-PELV W/ CM
2 of 5 series · 15 of 46 positions shown, 17 images · IV contrast (isovue)
Comparison: None.

CLINICAL DATA: Severe back pain after fall 3 days ago. Bruising to
the right side of the chest and left side upper back. Pain on deep
breathing.

EXAM:
CT CHEST, ABDOMEN, AND PELVIS WITH CONTRAST
TECHNIQUE: Multidetector CT imaging of the chest, abdomen and pelvis was
performed following the standard protocol during bolus
administration of intravenous contrast.
CONTRAST:  100 mL Isovue 370

[Series 2: cap with · axial · 0.70mm/px · z∈[-910,-390]mm · 12 of 118 slices shown, 14 images]
[im 7/118  soft-tissue]
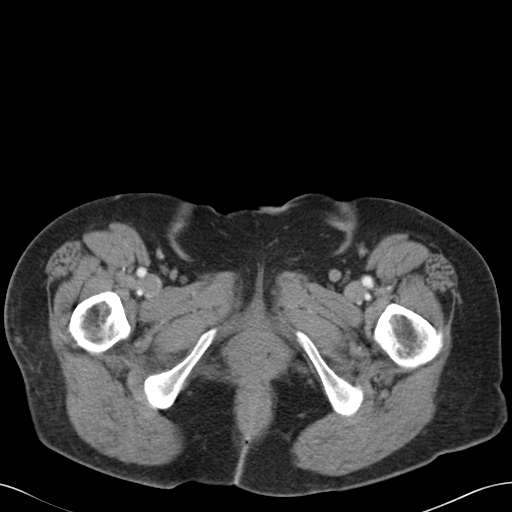
[im 7/118  bone]
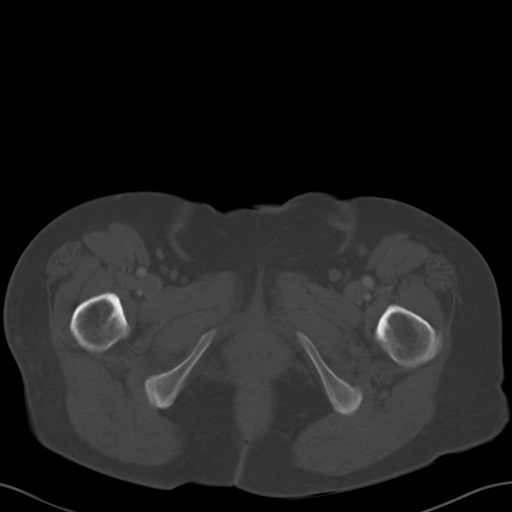
[im 20/118  soft-tissue]
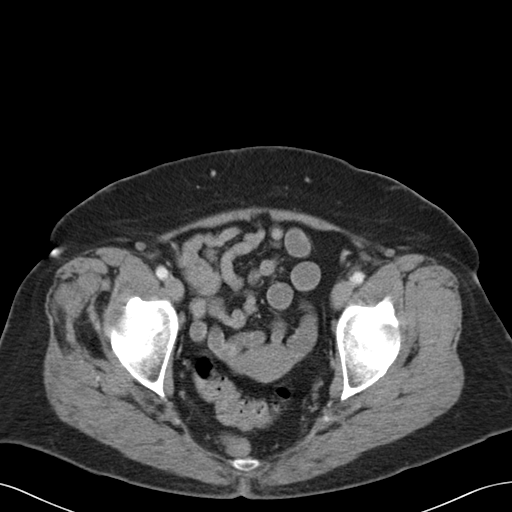
[im 27/118  soft-tissue]
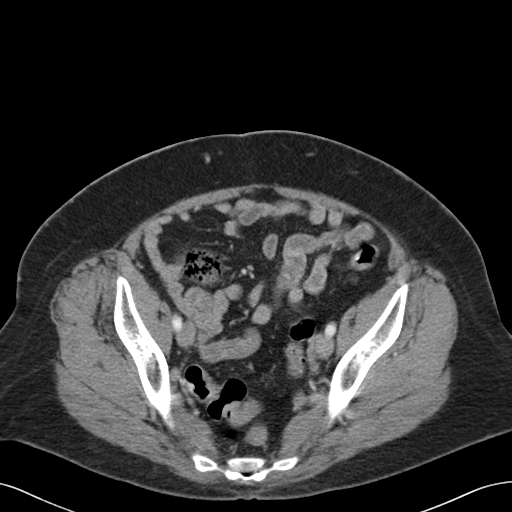
[im 33/118  soft-tissue]
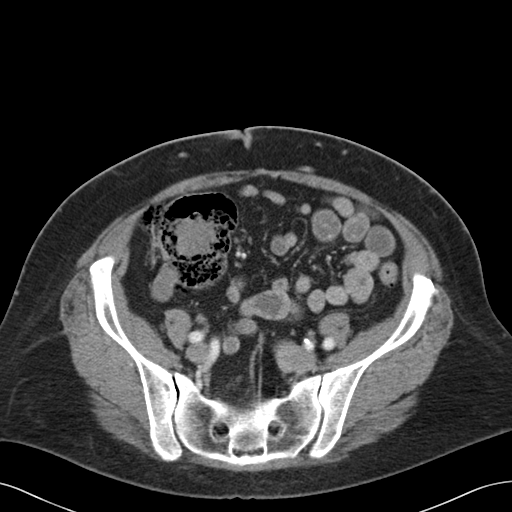
[im 46/118  soft-tissue]
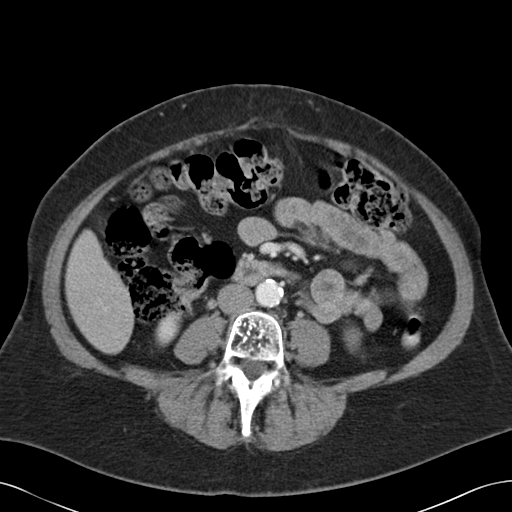
[im 53/118  soft-tissue]
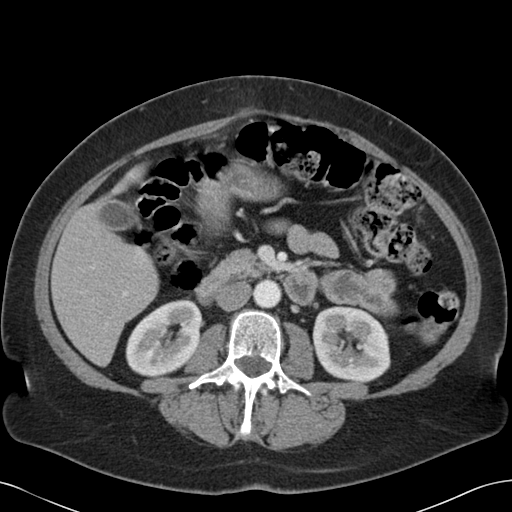
[im 66/118  soft-tissue]
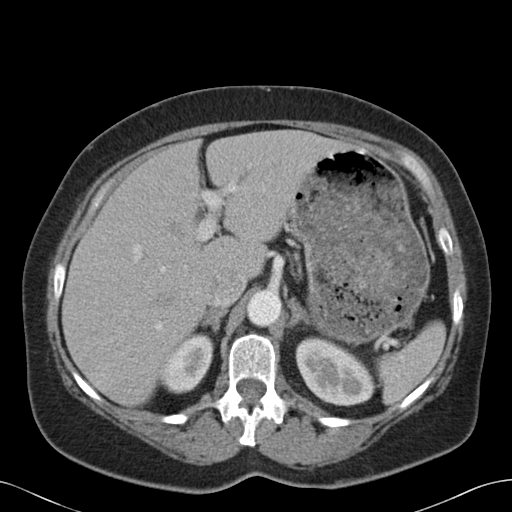
[im 72/118  soft-tissue]
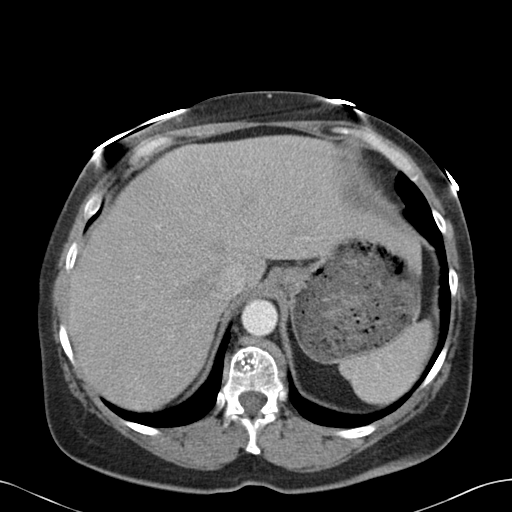
[im 85/118  soft-tissue]
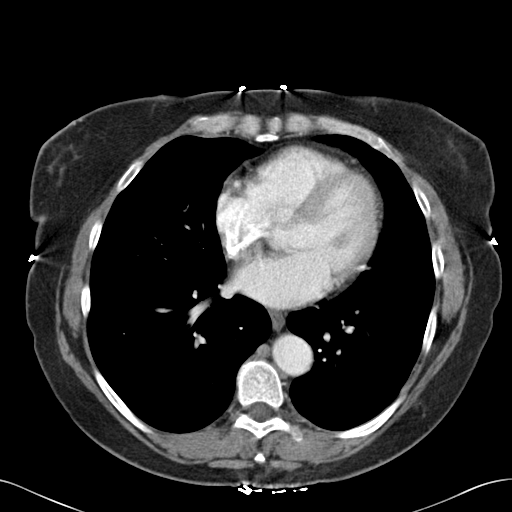
[im 85/118  bone]
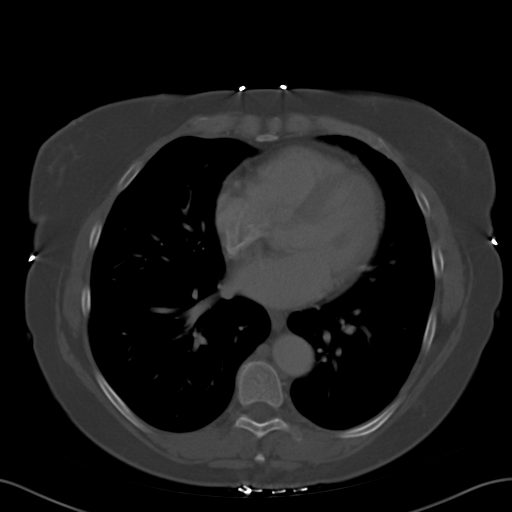
[im 92/118  soft-tissue]
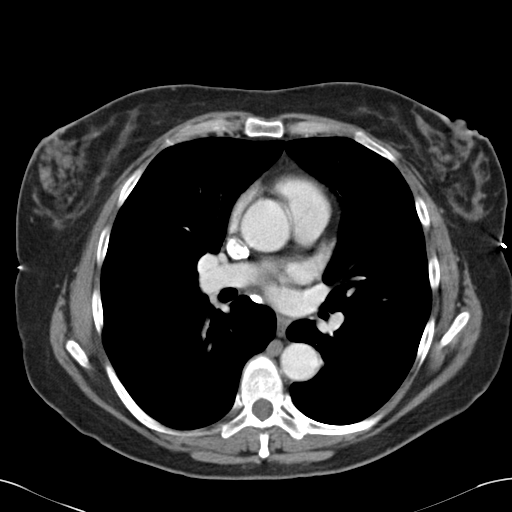
[im 98/118  soft-tissue]
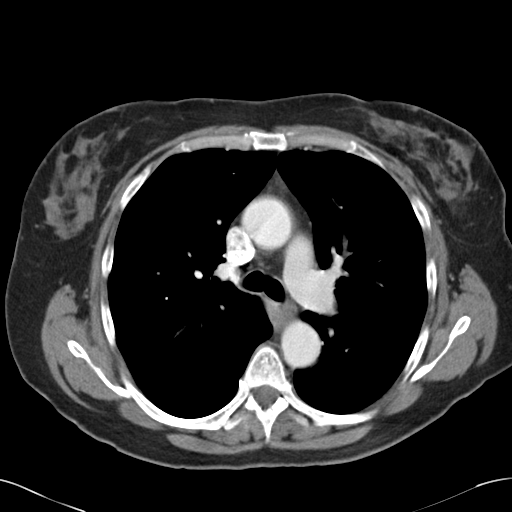
[im 111/118  soft-tissue]
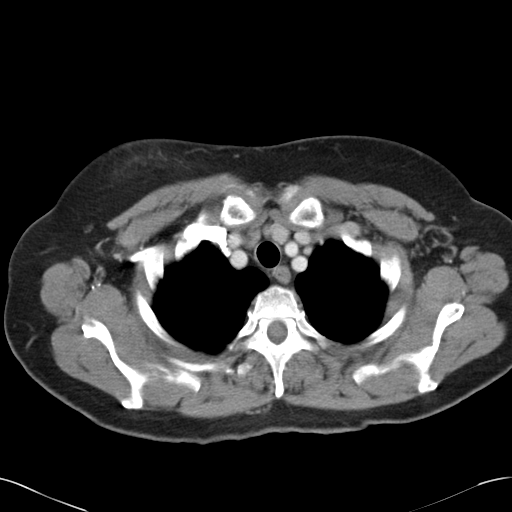

[Series 5: cor cap with · coronal · 0.73mm/px · 3 of 144 slices shown]
[im 48/144  soft-tissue]
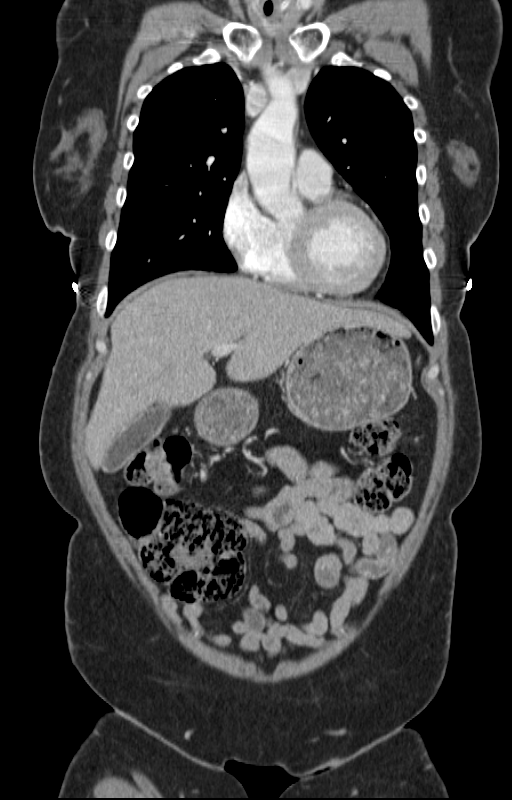
[im 64/144  soft-tissue]
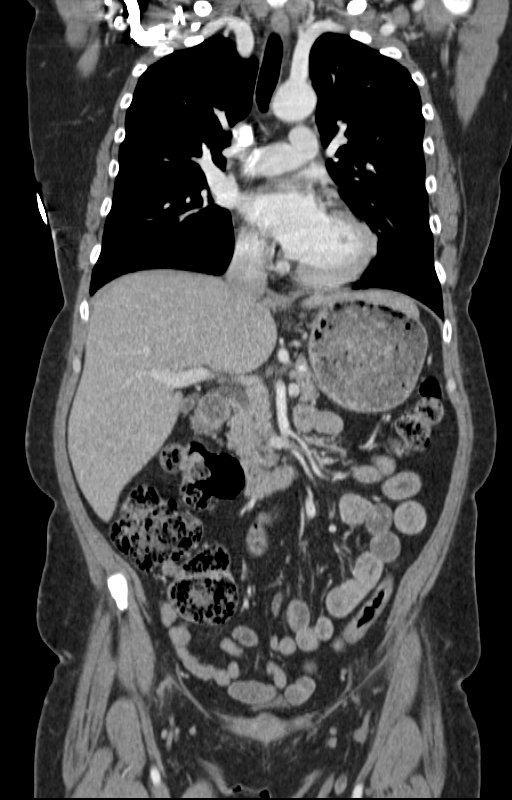
[im 80/144  soft-tissue]
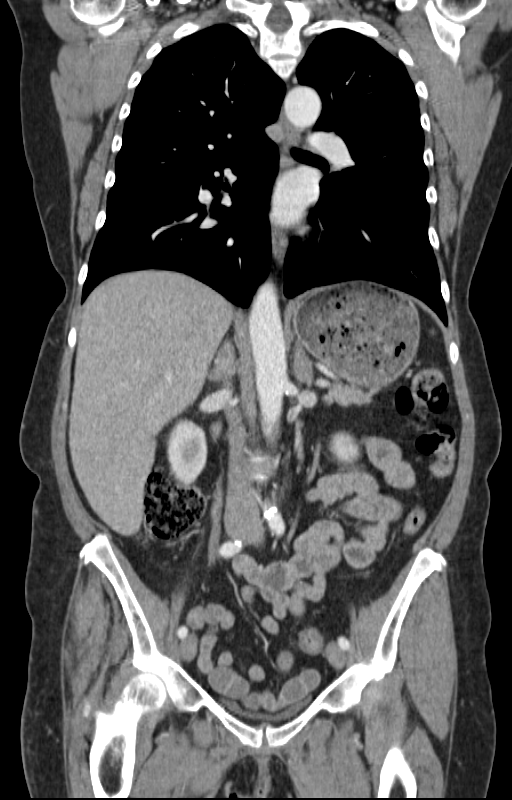

[15 of 46 positions shown; findings below may reference images not displayed]

FINDINGS: CT CHEST FINDINGS

Normal heart size. Normal caliber thoracic aorta. No evidence of
aortic dissection. Great vessel origins are patent. Esophagus is
decompressed. Calcified lymph node in the subcarinal region. No
significant lymphadenopathy in the chest.

Mild emphysematous changes in the lungs. No focal airspace disease
or consolidation. No pneumothorax. No pleural effusions.
Subcutaneous soft tissues appear intact. No discrete soft tissue
hematoma is demonstrated.

CT ABDOMEN AND PELVIS FINDINGS

The liver, spleen, gallbladder, pancreas, adrenal glands, kidneys,
abdominal aorta, inferior vena cava, and retroperitoneal lymph nodes
are unremarkable. Stomach is distended with ingested material. No
gastric wall thickening. Small bowel are decompressed. Stool-filled
colon without distention. No free air or free fluid in the abdomen.
Multiple midline anterior abdominal wall hernias containing fat. No
bowel herniation. Subcutaneous fatty tissues are unremarkable. No
discrete hematoma or fluid collection is identified.

Pelvis: No pelvic mass or lymphadenopathy. Bladder wall is not
thickened. No free or loculated pelvic fluid collections. Appendix
is not identified but no inflammatory changes are shown in the right
lower quadrant. No evidence of diverticulitis.

Bones: Multiple vertebral hemangiomas. Normal alignment of the
thoracic and lumbar spine. No vertebral compression deformities.
Sternum appears intact. Visualized portions of the shoulders and
clavicles appear intact. No acute rib fractures are demonstrated.
Old left rib fracture. Sacrum, pelvis, and hips appear intact. The
IMPRESSION: No acute posttraumatic change is demonstrated in the chest, abdomen,
or pelvis.

## 2016-07-16 IMAGING — CR DG CHEST 2V
1 series · 2 of 2 positions shown · non-contrast
Comparison: None.

CLINICAL DATA: Left-sided chest pain with difficulty breathing.

EXAM:
CHEST  2 VIEW

[Series 1: dxr chest pa (or ap) and lateral · 0.14mm/px · 2 of 2 slices shown]
[im 1/2]
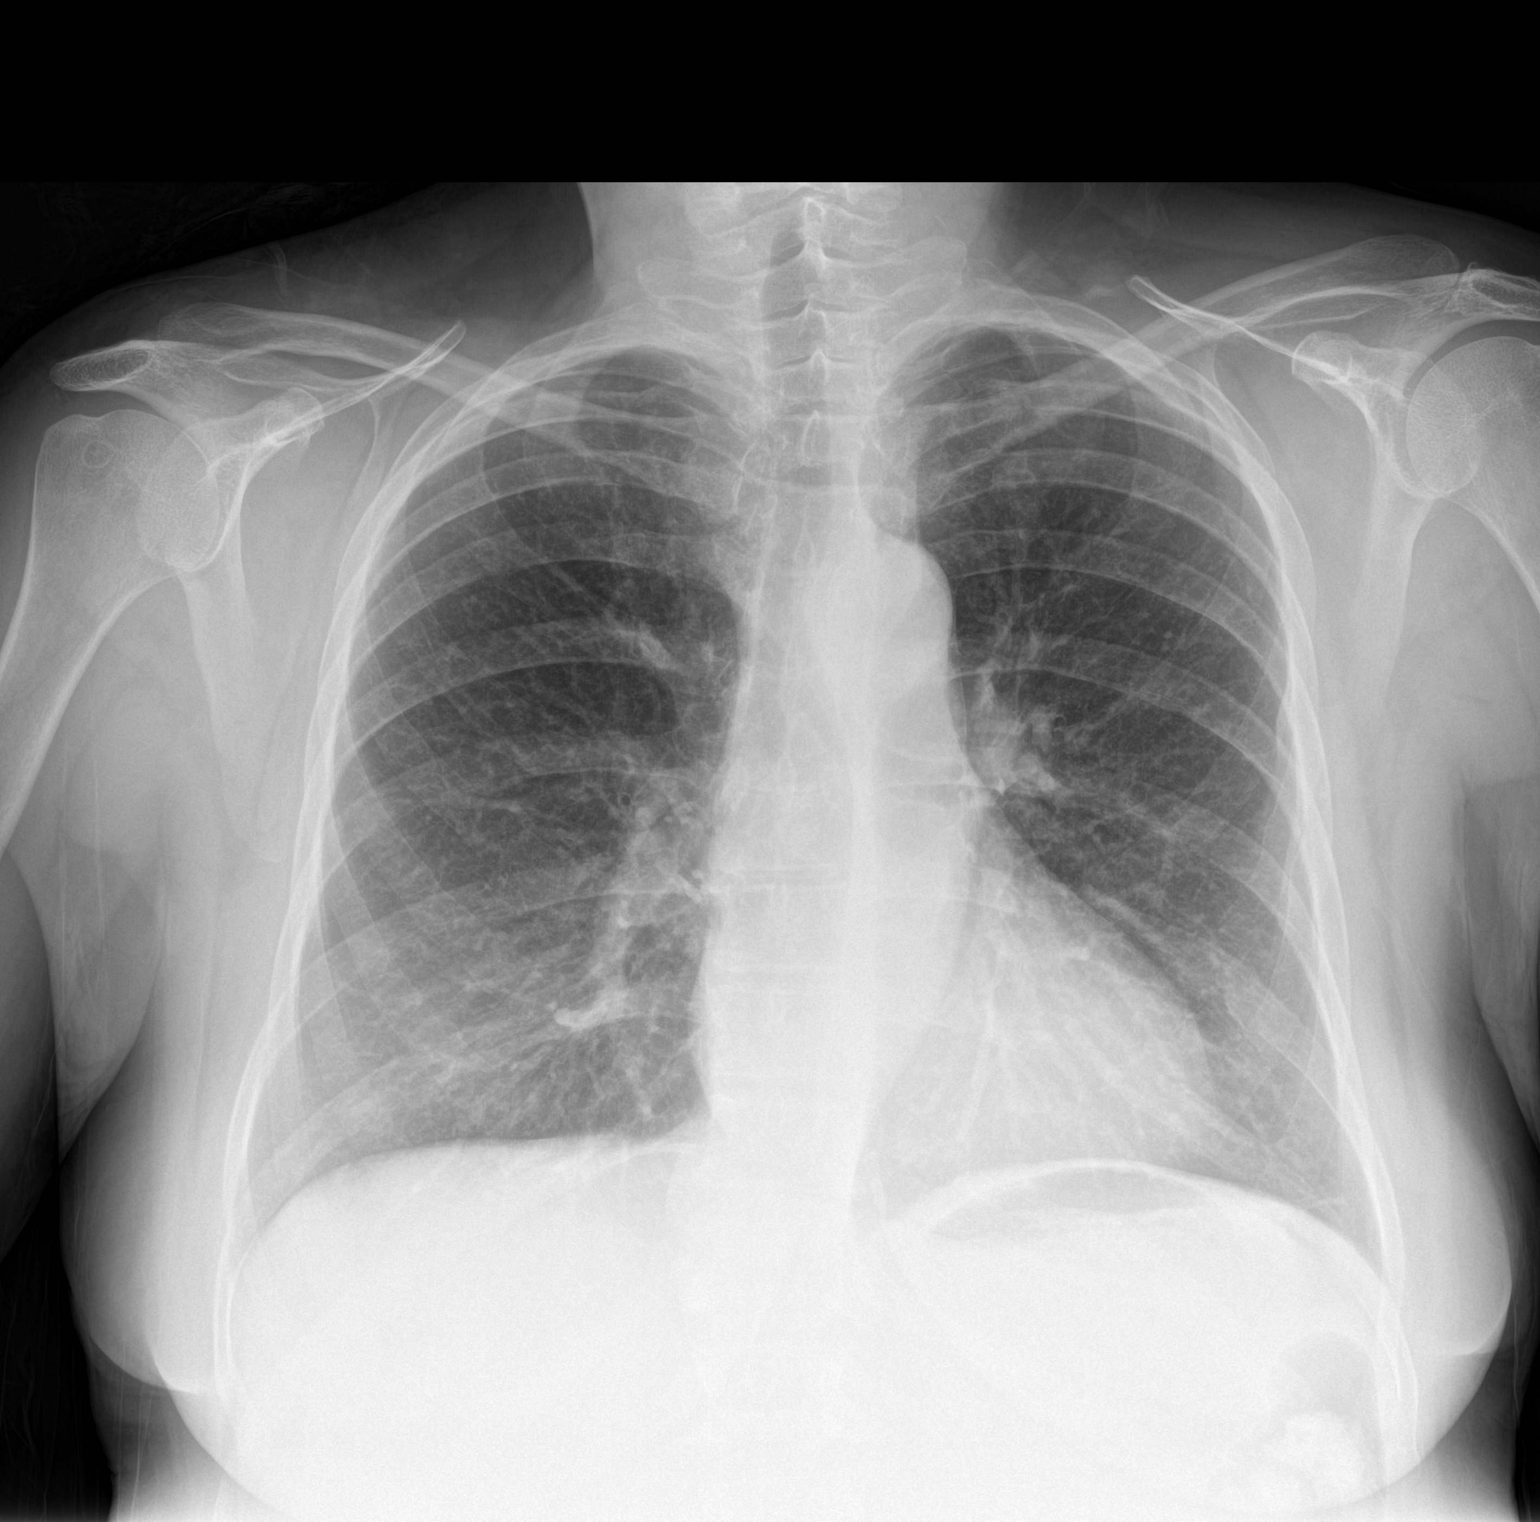
[im 2/2]
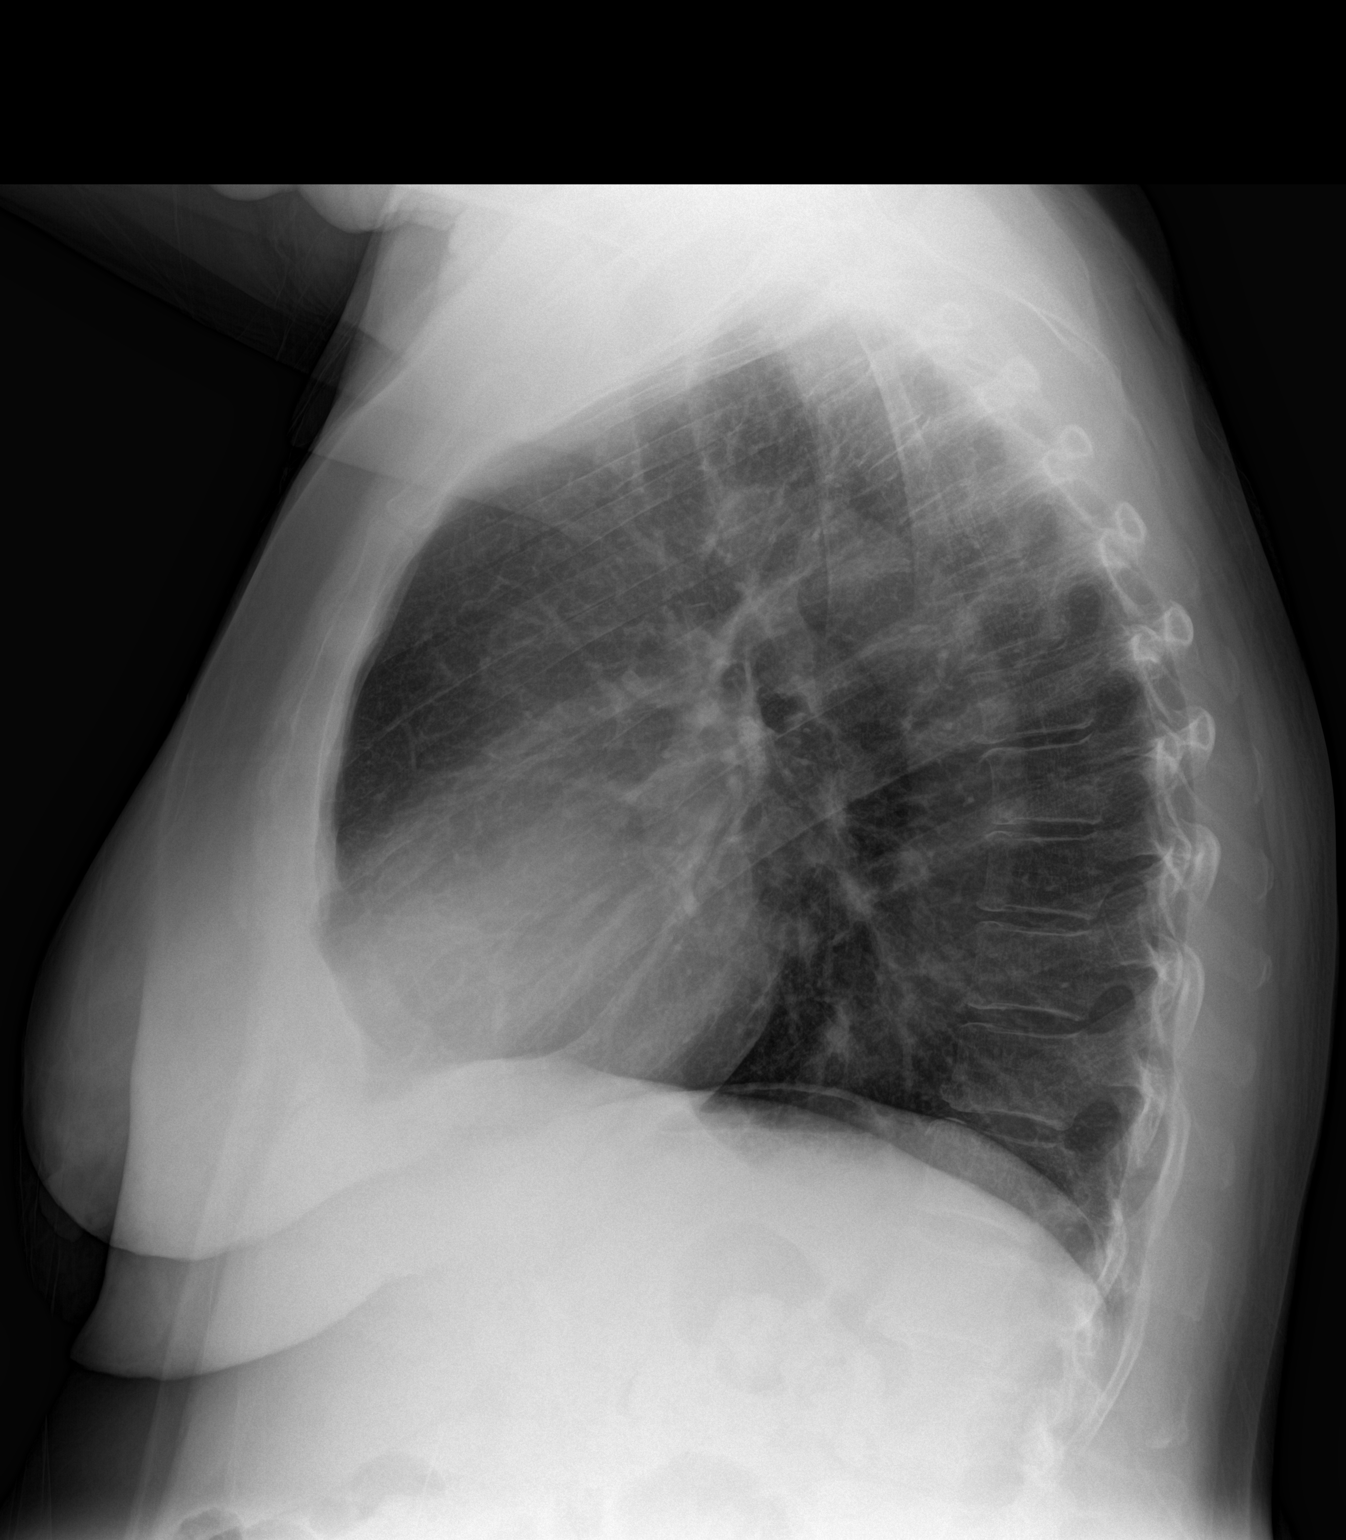

[2 of 2 positions shown; findings below may reference images not displayed]

FINDINGS: The lungs are clear without focal infiltrate, edema, pneumothorax or
pleural effusion. The cardiopericardial silhouette is within normal
limits for size. Bones are diffusely demineralized.
IMPRESSION: No active cardiopulmonary disease.

## 2017-09-11 ENCOUNTER — Other Ambulatory Visit: Payer: Self-pay | Admitting: Gastroenterology

## 2017-09-11 ENCOUNTER — Ambulatory Visit: Payer: Medicare Other | Attending: Otolaryngology | Admitting: Otolaryngology

## 2017-09-11 ENCOUNTER — Other Ambulatory Visit: Payer: Self-pay | Admitting: Otolaryngology

## 2017-09-11 ENCOUNTER — Encounter: Payer: Self-pay | Admitting: Otolaryngology

## 2017-09-11 VITALS — BP 123/90 | Ht 61.0 in | Wt 169.0 lb

## 2017-09-11 DIAGNOSIS — D101 Benign neoplasm of tongue: Secondary | ICD-10-CM

## 2017-09-11 DIAGNOSIS — E079 Disorder of thyroid, unspecified: Secondary | ICD-10-CM

## 2017-09-11 LAB — T4, FREE: Free T4: 1.4 NG/DL (ref 0.93–1.70)

## 2017-09-11 LAB — TSH: TSH: 0.42 u[IU]/mL (ref 0.27–4.20)

## 2017-09-11 NOTE — Progress Notes (Addendum)
Julia Burgess was referred by Dr. Chana Bode.    Subjective  Chief Complaint: She presents today for neck mass.    HPI: This is a 57 y.o. old female, who was last seen by [place] in 2017 in connection with an incidental finding of tongue base asymmetry.  Imaging and examination were normal.  I asked that she come in for another follow-up haven't seen her since that last visit in April 2017.  She comes in today for a new issue.  She has a neck mass low in the neck on the right.  She thinks is been present for several months and may be getting a little bit larger.  She has no history of radiation to the neck.  She is treated chronically for hypothyroidism.  Her paternal grandmother had thyroid cancer.    Patient has had all office notes and results be sent to her primary care physician as well as her neurologist, Dr. Wille Celeste, at [facility].    Outpatient Prescriptions Marked as Taking for the 09/11/17 encounter (Office Visit) with Dayton Scrape, MD   Medication Sig Dispense Refill    diazepam (VALIUM) 5 MG tablet Take 5 mg by mouth 3 times daily as needed      simvastatin (ZOCOR) 20 MG tablet Take 20 mg by mouth nightly      levalbuterol (XOPENEX HFA) 45 MCG/ACT inhaler INHALE TWO PUFFS BY MOUTH EVERY 4 TO 6 HOURS AS NEEDED      rivaroxaban (XARELTO) 20 MG tablet TAKE ONE TABLET BY MOUTH ONCE DAILY WITH  EVENING  MEAL      omeprazole (PRILOSEC) 40 MG capsule Take 40 mg by mouth daily      dronabinol (MARINOL) 2.5 MG capsule       COPAXONE 40 MG/ML prefilled syringe       XARELTO 20 MG tablet       levalbuterol (XOPENEX HFA) 45 MCG/ACT inhaler       Acetaminophen (APAP) 325 MG tablet Take 1,000 mg by mouth every 6 hours as needed for Pain         aspirin 81 MG EC tablet Take 81 mg by mouth daily         levothyroxine (SYNTHROID, LEVOTHROID) 100 MCG tablet Take 100 mcg by mouth daily (before breakfast)      gabapentin (NEURONTIN) 300 MG capsule Take 1-2 capsules QHS for 5-7 days.  If tolerated, may  increase to 1 capsule BID and 2 capsules at QHS. (Patient taking differently: Take 900 mg by mouth 2 times daily   ) 120 2       Medication allergies: Bee venom and Compazine [prochlorperazine]    Problem List:  does not have a problem list on file.    Medical History:   Past Medical History:   Diagnosis Date    Chronic hypokalemia     Chronic low back pain     Hypercholesterolemia     Hypertension     Multiple sclerosis     Osteoarthritis     Tobacco abuse        Surgical History:   Past Surgical History:   Procedure Laterality Date    APPENDECTOMY      colostomy reversal  1993    perianal repair      TONSILLECTOMY AND ADENOIDECTOMY      VARICOSE VEIN SURGERY          Family History: family history includes Cancer in her maternal grandmother and paternal grandmother.  Social History:   Social History   Substance Use Topics    Smoking status: Current Every Day Smoker     Packs/day: 1.00     Years: 30.00     Types: Cigarettes     Start date: 04/08/1980    Smokeless tobacco: Never Used    Alcohol use Yes      Comment: occas       ROS: ENT ROS: as above    Objective  Vitals:    09/11/17 1302   BP: 123/90   Weight: 76.7 kg (169 lb)   Height: 1.549 m (5\' 1" )             Exam  Constitution:  Appears well developed and well nourished. No signs of acute distress present. Patient is cooperative and overall behavior is appropriate.    Head / Face: Atraumatic, normocephalic on inspection.    Eyes:  Conjunctivae clear. No periorbital edema of the upper and lower lids. Sclerae clear.    Ears: Inspection reveals no lesion of the ears, swelling of the ears, or tenderness of the ears.  No discharge, mass or stenosis in the auditory canals.     TM's - translucent, normal landmarks, and no retraction. No swelling or tenderness of the post auricular area bilaterally.    Nose: External Nose: exhibits no deformity or lesions. Internal Nose: No discharge from the nasal mucosae, no edema, no erythema of the nasal mucosae.  Nasal Septum: not significantly deviated or perforated. Nasal turbinates are normal in color and size.    Oral cavity: Lips appear normal and healthy. Dentition is normal for age. Gums appear healthy. Tongue shows a smooth surface and symmetry. Floor of the mouth appears normal. Salivary glands normal in size with no asymmetry. Submandibular and parotid ducts are patent bilaterally. Oral mucosa moist with no thrush and no mucositis. Hard palate normal in appearance.    Oropharynx: No lesions or masses. Soft palate normal in appearance. Uvula midline and normal in size. Tonsils appear normal. Posterior pharyngeal mucosa appears normal.    Neck: Symmetric. Palpation reveals no swelling or tenderness. No masses appreciated. Trachea is midline and has good landmarks. Thyroid  exhibits a right-sided mass which is firm and mobile slightly irregular.  Overlying skin is normal.  This mass measures about 2.5 cm.      I reviewed her recent MRI of the brain.  This does not show the area of the thyroid mass.  Her tongue base appears symmetric, although this is a limited study..      Assessment    ICD-10-CM ICD-9-CM    1. Thyroid mass E07.9 246.9 T4, free      TSH      Thyroglobulin antibody      Thyroid peroxidase antibody      US thyroid parathyroid   2. 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  We will check a dedicated thyroid ultrasound.  Her most recent TSH that I can find was done in November 2018 and was slightly suppressed at 0.21.  Recheck labs as above.  Will call with results and give further disposition at that time.  We discussed that most likely she will need a fine-needle aspiration.    2.  Tongue base appears to be stable.  Recheck as needed    Seen by : Dayton Scrape, MD 09/11/2017

## 2017-09-11 NOTE — Progress Notes (Signed)
Thyroid U/S scheduled [place] 09/11/17 @ 4:15, Dr.DeCicco to call with results, labs were given also.

## 2017-09-13 ENCOUNTER — Telehealth: Payer: Self-pay

## 2017-09-13 DIAGNOSIS — E079 Disorder of thyroid, unspecified: Secondary | ICD-10-CM

## 2017-09-13 NOTE — Telephone Encounter (Signed)
Patient is awaiting to see if she will need NCB , I did assure here we will call her asap when ultrasound results are received

## 2017-09-13 NOTE — Telephone Encounter (Signed)
Ultrasound confirms thyroid mass.  Seems unchanged in size to one which was seen on a neck CT in 2016.  She should have an FNA at [facility].  Order done.  The office will call with the results and give further disposition at that time.

## 2017-09-14 NOTE — Telephone Encounter (Signed)
I left Julia Burgess a message to return my call.    I have sent the requisition, ultrasound and order to [facility], they will call her with an appointment and we will call her with results when received.

## 2017-09-14 NOTE — Telephone Encounter (Signed)
I spoke to Julia Burgess about her ultrasound results, she is a little confused because she did not know she had a thyroid mass in 2016.    She is aware that she will be hearing from [facility] to set up her FNA and we will  Call her when we receive the results.

## 2017-09-18 ENCOUNTER — Telehealth: Payer: Self-pay

## 2017-09-18 NOTE — Telephone Encounter (Signed)
Discussed results with Kacia.  Radiologist was comparing to CTA from 2016.  I can't see anything on the CT myself.  We will call with the FNA result.

## 2017-09-18 NOTE — Telephone Encounter (Signed)
Julia Burgess is calling states she has questions on some discrepancies from U/S done in 2016 vs the most recent one done. She is scheduled for needle bx @ [facility] on 09/22/17 and is requesting Dr.DeCicco call her, she may be reached @ [phone].

## 2017-09-19 NOTE — Telephone Encounter (Signed)
noted 

## 2017-09-20 ENCOUNTER — Telehealth: Payer: Self-pay

## 2017-09-20 NOTE — Telephone Encounter (Signed)
Pre procedure

## 2017-09-22 ENCOUNTER — Ambulatory Visit
Admission: RE | Admit: 2017-09-22 | Discharge: 2017-09-22 | Disposition: A | Discharge status: Home / Self Care | Payer: Medicare Other | Source: Ambulatory Visit | Attending: Radiology | Admitting: Radiology

## 2017-09-22 DIAGNOSIS — E041 Nontoxic single thyroid nodule: Secondary | ICD-10-CM | POA: Insufficient documentation

## 2017-09-22 DIAGNOSIS — E079 Disorder of thyroid, unspecified: Secondary | ICD-10-CM

## 2017-09-22 HISTORY — DX: Unspecified asthma, uncomplicated: J45.909

## 2017-09-22 HISTORY — DX: Multiple sclerosis: G35

## 2017-09-22 HISTORY — DX: Primary progressive multiple sclerosis, unspecified: G35.B0

## 2017-09-22 HISTORY — DX: Depression, unspecified: F32.A

## 2017-09-22 MED ORDER — LIDOCAINE HCL (PF) 1 % IJ SOLN *I*
INTRAMUSCULAR | Status: AC
Start: 2017-09-22 — End: 2017-09-22
  Filled 2017-09-22: qty 30

## 2017-09-22 NOTE — Progress Notes (Signed)
[employer] Nursing Procedure Note    Julia Burgess  [id number]    Procedure: right side thyroid nodule bx        Status: Completed    Patient tolerated procedure well     Specimen Collection: yes    Sponge count: N/A    Fluid Removed:N/A  Procedure Dressing Site located:Neck  Dressing Type:band aid  Biopatch:no  Dressing status:Clean, dry and intact   Hematoma:Not evident  Medication received:None  Cardiovascular:   Peripheral Pulses: Unchanged from baseline      Fistula: N/A    Neuro Assessment:Patient is at pre-procedure baseline    Implant patient information given to patient or parent/guardian:N/A    Report given to:      Last Filed Vitals    09/22/17 0931   BP: 164/73   Pulse: 81   Resp: 20   Temp: 37 C (98.6 F)   SpO2: 97%

## 2017-09-22 NOTE — Procedures (Signed)
Procedure Report           Time out documentation completed prior to procedure:  Yes    Indications/Pre-Procedure diagnosis:  Right thyroid nodule    Procedure performed: IR biopsy US thyroid    Guide Wire Removed:N/A    Findings/Procedure Summary (detailed report located in the Image tab): right thyroid nodule near the isthmus was targeted and FNA performed    Complications:  None immediate    Condition:  good    EBL: trace at site    Specimens:  yes    Operators: Dr. Alcide Clever    Disposition:  home    Post-Procedure Diagnosis: thyroid nodule    Alcide Clever, MD  09/22/2017  10:42 AM

## 2017-09-22 NOTE — Preop H&P (Signed)
OUTPATIENT  Chief Complaint: right thyroid nodule    History of Present Illness:  HPI    Past Medical History:   Diagnosis Date    Asthma     Chronic hypokalemia     Chronic low back pain     Depression     Hypercholesterolemia     Hypertension     Multiple sclerosis     Multiple sclerosis, primary progressive     Osteoarthritis     Tobacco abuse      Past Surgical History:   Procedure Laterality Date    APPENDECTOMY      colostomy reversal  1993    perianal repair      TONSILLECTOMY AND ADENOIDECTOMY      VARICOSE VEIN SURGERY       Family History   Problem Relation Age of Onset    Cancer Maternal Grandmother         breast and uterine cancer at age 67    Cancer Paternal Grandmother         thryoid cancer     Social History     Social History    Marital status: Married     Spouse name: N/A    Number of children: N/A    Years of education: N/A     Social History Main Topics    Smoking status: Current Every Day Smoker     Packs/day: 1.00     Years: 30.00     Types: Cigarettes     Start date: 04/08/1980    Smokeless tobacco: Never Used    Alcohol use Yes      Comment: occas    Drug use: No    Sexual activity: Not Asked     Other Topics Concern    None     Social History Narrative    None       Allergies:   Allergies   Allergen Reactions    Bee Venom Anaphylaxis    Compazine [Prochlorperazine] Other (See Comments)     myoclonic       Current Outpatient Prescriptions   Medication    levalbuterol (XOPENEX HFA) 45 MCG/ACT inhaler    rivaroxaban (XARELTO) 20 MG tablet    omeprazole (PRILOSEC) 40 MG capsule    dronabinol (MARINOL) 2.5 MG capsule    COPAXONE 40 MG/ML prefilled syringe    levalbuterol (XOPENEX HFA) 45 MCG/ACT inhaler    Acetaminophen (APAP) 325 MG tablet    aspirin 81 MG EC tablet    levothyroxine (SYNTHROID, LEVOTHROID) 100 MCG tablet    pantoprazole (PROTONIX) 40 MG EC tablet    gabapentin (NEURONTIN) 300 MG capsule    diazepam (VALIUM) 5 MG tablet    simvastatin  (ZOCOR) 20 MG tablet    XARELTO 20 MG tablet    HYDROcodone-acetaminophen (NORCO) 10-325 MG per tablet     No current facility-administered medications for this encounter.         Review of Systems:   ROS    Last Nursing documented pain:  0-10 Scale: 4 (09/22/17 0931)      Patient Vitals for the past 24 hrs:   BP Temp Temp src Pulse Resp SpO2 Height Weight   09/22/17 0931 164/73 37 C (98.6 F) Tympanic 81 20 97 % 154.9 cm (5\' 1" ) 76.7 kg (169 lb)            Physical Exam    Lab Results:   All labs in the last 24 hours: No  results found for this or any previous visit (from the past 24 hour(s)).    Radiology impressions (last 3 days):  No results found.    Currently Active/Followed Hospital Problems:  There are no active hospital problems to display for this patient.      Assessment: right thyroid nodule    Plan: right thyroid biopsy    Author: Mariea Stable, NP  Note created: 09/22/2017  at: 10:08 AM

## 2017-09-22 NOTE — Invasive Procedure Plan of Care (Signed)
Invasive Procedure Plan of Care (Consent Form 419):   Condition(s) Addressed: Abnormal tissue needing sampling   Performing Provider: Mariea Stable, and Vascular and Interventional Radiology Attendings, Fellows, Residents and Advanced Practice Providers     Side:    Procedure: Introduction of a needle under image guidance in to abnormal tissue and obtaining tissue samples   Special Equipment:    Planned Anesthesia:    Benefits: Obtaining a tissue sample for analysis   Risks: Bleeding, infection, damage to surrounding organs and structures, allergic reaction to contrast agent, collapse of lung, and in very rare circumstances, death.   Alternatives: Not to perform the procedure   Expected Length of Stay:  day(s)     I, or a designated member of my surgical team, have discussed the planned procedure, including the potential for any transfusion of blood products or receipt of tissue as necessary, expected benefits, the potential complications and risks and possible alternatives and their benefits and risks with the patient or the patient's surrogate. In my opinion, the patent or the patient's surrogate understands the proposed procedure, its risks, benefits, and alternatives.    Electronically signed by Mariea Stable, NP at 10:01 AM     Patient Consent:  I hereby give my consent and authorize Mariea Stable, and Vascular and Interventional Radiology Attendings, Fellows, Residents and Advanced Practice Providers    (The list of possible assistants, all of whom are privileged to provide surgical services at the hospital, is available)  To treat the following: Abnormal tissue needing sampling  Procedure includes: Introduction of a needle under image guidance in to abnormal tissue and obtaining tissue samples  Laterality:   1 The care provider has explained my condition to me, the benefits of having the above treatment procedure, and alternate ways of treating my condition. I understand that no guarantees have  been made to me about the result of the treatment. The alternatives to this procedure include: Not to perform the procedure   2 The care provider has discussed with me the reasonably foreseeable risks of the treatment and that there may be undesirable results. The risks that are specifically related to this procedure include: Bleeding, infection, damage to surrounding organs and structures, allergic reaction to contrast agent, collapse of lung, and in very rare circumstances, death.   3 I understand that during the treatment a condition may be discovered which was not known before the treatment started. Therefore, I authorize the care provider to perform any additional or different treatment which is thought necessary and available.   4 Any tissue, parts, or substances removed during the procedure may be retained or disposed of in accordance with customary scientific, educational and clinical practice.   5 Vendor information if appropriate:    6 If blood products are needed, I would agree:    7 If tissue products are needed, I would agree:    8 Blood/Tissue use limitations and/or exclusions:       I have carefully read and fully understand this informed consent form, and have had sufficient opportunity to discuss my condition and the above procedure(s) with the care provider and his/her associates, and all of my questions have been answered to my satisfaction. I understand that my surgeon/provider performing the procedure may not be physically present in the operating/procedure room the entire time that I am there. My surgeon/provider has answered my questions regarding this and how it may relate to my surgery/procedure. I agree to the Plan of Care as outlined above.  Patient Signature   (or Parent/Legal Guardian if pt is unable to sign or is a minor)  Date/Time     Electronic Signatures will display at the bottom of the consent form.

## 2017-09-22 NOTE — Interval H&P Note (Signed)
UPDATES TO PATIENT'S CONDITION on the DAY OF SURGERY/PROCEDURE    I. Updates to Patient's Condition (to be completed by a provider privileged to complete a H&P, following reassessment of the patient by the provider):    Day of Surgery/Procedure Update:  History  History reviewed and no change    Physical  Physical exam updated and no change            II. Procedure Readiness   I have reviewed the patient's H&P and updated condition. By completing and signing this form, I attest that this patient is ready for surgery/procedure.    III. Attestation   I have reviewed the updated information regarding the patient's condition and it is appropriate to proceed with the planned surgery/procedure.    Alcide Clever, MD as of 10:24 AM 09/22/2017

## 2017-09-22 NOTE — Discharge Instructions (Signed)
THYROID BIOPSY Discharge Instructions   09/22/2017  11:13 AM    Provider performing test/procedure: Dr. Alcide Clever    Patient received medication? No    PATIENT INSTRUCTIONS     You should go home after being discharged from the procedure and rest for the remainder of the day.  You may do quiet activities, such as reading or watching television.     You may return to normal activities tomorrow.     You can resume your normal diet after the biopsy.     You may resume blood thinning medications (Coumadin, Warfarin, Plavix, etc) tomorrow.     You may have a sore throat or neck after the biopsy. You may use throat lozenges, ice packs, ibuprofen or acetaminophen for the discomfort.     There may be bruising at the biopsy site.  The bruise will start to fade in 7-10 days.     If you have a dressing over the site, it can be removed 12 hours after the biopsy.     Please contact your doctor for results of the biopsy.  They are usually available within 5 business days.     If you develop difficulty swallowing, difficulty breathing or increased swelling in your neck please go to the nearest Emergency Room or call 911.    Please call [facility]iology for questions or concerns regarding your procedure.  Monday-Friday from [place] call [phone].  After hours and weekends you may call [phone] and speak to the Resident on-call. The Emergency Department is open 24 hours a day if emergency treatment is required.

## 2017-09-22 NOTE — H&P (View-Only) (Signed)
OUTPATIENT  Chief Complaint: right thyroid nodule    History of Present Illness:  HPI    Past Medical History:   Diagnosis Date    Asthma     Chronic hypokalemia     Chronic low back pain     Depression     Hypercholesterolemia     Hypertension     Multiple sclerosis     Multiple sclerosis, primary progressive     Osteoarthritis     Tobacco abuse      Past Surgical History:   Procedure Laterality Date    APPENDECTOMY      colostomy reversal  1993    perianal repair      TONSILLECTOMY AND ADENOIDECTOMY      VARICOSE VEIN SURGERY       Family History   Problem Relation Age of Onset    Cancer Maternal Grandmother         breast and uterine cancer at age 48    Cancer Paternal Grandmother         thryoid cancer     Social History     Social History    Marital status: Married     Spouse name: N/A    Number of children: N/A    Years of education: N/A     Social History Main Topics    Smoking status: Current Every Day Smoker     Packs/day: 1.00     Years: 30.00     Types: Cigarettes     Start date: 04/08/1980    Smokeless tobacco: Never Used    Alcohol use Yes      Comment: occas    Drug use: No    Sexual activity: Not Asked     Other Topics Concern    None     Social History Narrative    None       Allergies:   Allergies   Allergen Reactions    Bee Venom Anaphylaxis    Compazine [Prochlorperazine] Other (See Comments)     myoclonic       Current Outpatient Prescriptions   Medication    levalbuterol (XOPENEX HFA) 45 MCG/ACT inhaler    rivaroxaban (XARELTO) 20 MG tablet    omeprazole (PRILOSEC) 40 MG capsule    dronabinol (MARINOL) 2.5 MG capsule    COPAXONE 40 MG/ML prefilled syringe    levalbuterol (XOPENEX HFA) 45 MCG/ACT inhaler    Acetaminophen (APAP) 325 MG tablet    aspirin 81 MG EC tablet    levothyroxine (SYNTHROID, LEVOTHROID) 100 MCG tablet    pantoprazole (PROTONIX) 40 MG EC tablet    gabapentin (NEURONTIN) 300 MG capsule    diazepam (VALIUM) 5 MG tablet    simvastatin  (ZOCOR) 20 MG tablet    XARELTO 20 MG tablet    HYDROcodone-acetaminophen (NORCO) 10-325 MG per tablet     No current facility-administered medications for this encounter.         Review of Systems:   ROS    Last Nursing documented pain:  0-10 Scale: 4 (09/22/17 0931)      Patient Vitals for the past 24 hrs:   BP Temp Temp src Pulse Resp SpO2 Height Weight   09/22/17 0931 164/73 37 C (98.6 F) Tympanic 81 20 97 % 154.9 cm (5\' 1" ) 76.7 kg (169 lb)            Physical Exam    Lab Results:   All labs in the last 24 hours: No  results found for this or any previous visit (from the past 24 hour(s)).    Radiology impressions (last 3 days):  No results found.    Currently Active/Followed Hospital Problems:  There are no active hospital problems to display for this patient.      Assessment: right thyroid nodule    Plan: right thyroid biopsy    Author: Mariea Stable, NP  Note created: 09/22/2017  at: 10:08 AM

## 2017-09-26 LAB — MEDICAL CYTOLOGY

## 2017-09-27 ENCOUNTER — Telehealth: Payer: Self-pay

## 2017-09-27 DIAGNOSIS — E079 Disorder of thyroid, unspecified: Secondary | ICD-10-CM

## 2017-09-27 NOTE — Telephone Encounter (Signed)
We are calling this patient with her FNA results and they are in her chart.

## 2017-09-27 NOTE — Telephone Encounter (Signed)
Please inform patient that the fine-needle aspiration was benign.  I recommend we see her again in a year with an ultrasound prior to the visit.  Order done.

## 2017-09-27 NOTE — Telephone Encounter (Signed)
Yes

## 2017-09-27 NOTE — Telephone Encounter (Signed)
Appointment was made

## 2017-09-27 NOTE — Telephone Encounter (Signed)
Spoke to Julia Burgess, she voiced understanding of the fna results. She states that the mass in bothersome to her. She states that it is not painful but almost occludes her airway if she puts her head done. Should she come in for a visit to discuss?

## 2017-09-27 NOTE — Telephone Encounter (Signed)
Julia Burgess called asking about her results, I told her we were waiting for Dr. To review and someone will call her today 09/27/17 or 09/28/17.

## 2017-10-09 ENCOUNTER — Encounter: Payer: Self-pay | Admitting: Otolaryngology

## 2017-10-09 ENCOUNTER — Ambulatory Visit: Payer: Medicare Other | Attending: Otolaryngology | Admitting: Otolaryngology

## 2017-10-09 VITALS — BP 128/80 | Ht 61.0 in | Wt 172.0 lb

## 2017-10-09 DIAGNOSIS — E079 Disorder of thyroid, unspecified: Secondary | ICD-10-CM

## 2017-10-09 DIAGNOSIS — R1314 Dysphagia, pharyngoesophageal phase: Secondary | ICD-10-CM

## 2017-10-09 NOTE — Progress Notes (Signed)
Julia Burgess was referred by Dr. Chana Bode.    Subjective  Chief Complaint: She presents today for thyroid mass.    HPI: This is a 57 y.o. old female, who comes in to discuss her thyroid mass on the right.  In retrospect is been present for at least several years.  Fine-needle aspiration was benign.  She is concerned with some food sticking when she swallows.  Sometimes when she leans her head forward she can feel some pressure from the mass on the right.  The food sticking has been for about 6 weeks or so.    Outpatient Prescriptions Marked as Taking for the 10/09/17 encounter (Office Visit) with Dayton Scrape, MD   Medication Sig Dispense Refill    levalbuterol (XOPENEX HFA) 45 MCG/ACT inhaler INHALE 2 PUFFS INTO LUNGS EVERY 4 TO 6 HOURS AS NEEDED      simvastatin (ZOCOR) 20 MG tablet Take 20 mg by mouth nightly      dronabinol (MARINOL) 2.5 MG capsule       COPAXONE 40 MG/ML prefilled syringe       XARELTO 20 MG tablet       levalbuterol (XOPENEX HFA) 45 MCG/ACT inhaler       Acetaminophen (APAP) 325 MG tablet Take 1,000 mg by mouth every 6 hours as needed for Pain         levothyroxine (SYNTHROID, LEVOTHROID) 100 MCG tablet Take 100 mcg by mouth daily (before breakfast)      pantoprazole (PROTONIX) 40 MG EC tablet Take 40 mg by mouth daily   Swallow whole. Do not crush, break, or chew.      gabapentin (NEURONTIN) 300 MG capsule Take 1-2 capsules QHS for 5-7 days.  If tolerated, may increase to 1 capsule BID and 2 capsules at QHS. (Patient taking differently: Take 900 mg by mouth 2 times daily   ) 120 2       Medication allergies: Bee venom; Compazine [prochlorperazine]; Ketorolac; Lactase; and Meperidine    Problem List:  does not have a problem list on file.    Medical History:   Past Medical History:   Diagnosis Date    Asthma     Chronic hypokalemia     Chronic low back pain     Depression     Hypercholesterolemia     Hypertension     Multiple sclerosis     Multiple sclerosis, primary  progressive     Osteoarthritis     Tobacco abuse        Surgical History:   Past Surgical History:   Procedure Laterality Date    APPENDECTOMY      colostomy reversal  1993    perianal repair      TONSILLECTOMY AND ADENOIDECTOMY      VARICOSE VEIN SURGERY          Family History: family history includes Cancer in her maternal grandmother and paternal grandmother.    Social History:   Social History   Substance Use Topics    Smoking status: Current Some Day Smoker     Packs/day: 1.00     Years: 30.00     Types: Cigarettes     Start date: 04/08/1980    Smokeless tobacco: Never Used      Comment: just recently quit    Alcohol use Yes      Comment: occas       ROS: ENT ROS: as above    Objective  Vitals:    10/09/17 0950  BP: 128/80   Weight: 78 kg (172 lb)   Height: 1.549 m (5\' 1" )             Exam  Constitution:  Appears well developed and well nourished. No signs of acute distress present. Patient is cooperative and overall behavior is appropriate.    Head / Face: Atraumatic, normocephalic on inspection.    Eyes:  Conjunctivae clear. No periorbital edema of the upper and lower lids. Sclerae clear.    Ears: Inspection reveals no lesion of the ears, swelling of the ears, or tenderness of the ears.  No discharge, mass or stenosis in the auditory canals.     TM's - translucent, normal landmarks, and no retraction. No swelling or tenderness of the post auricular area bilaterally.    Nose: External Nose: exhibits no deformity or lesions. Internal Nose: No discharge from the nasal mucosae, no edema, no erythema of the nasal mucosae. Nasal Septum: not significantly deviated or perforated. Nasal turbinates are normal in color and size.    Oral cavity: Lips appear normal and healthy. Dentition is normal for age. Gums appear healthy. Tongue shows a smooth surface and symmetry. Floor of the mouth appears normal. Salivary glands normal in size with no asymmetry. Submandibular and parotid ducts are patent bilaterally.  Oral mucosa moist with no thrush and no mucositis. Hard palate normal in appearance.    Oropharynx: No lesions or masses. Soft palate normal in appearance. Uvula midline and normal in size. Tonsils appear normal. Posterior pharyngeal mucosa appears normal.    Neck: Symmetric. Palpation reveals no swelling or tenderness. No masses appreciated. Trachea is midline and has good landmarks. Thyroid  exhibits a right-sided mass which is firm and mobile slightly irregular.    Neurological: Alert and oriented x 3.    Psychological: Mood is normal. Patient's affect is appropriate to mood. Speech is spontaneous with regular rate, rhythm, and volume.     Flexible nasopharyngoscopy is carried out after topical afrin and lidocaine to each nostril - normal nasal cavity, nasopharynx, hypopharynx and larynx.  Normal tongue base.  Normal bilateral true vocal cord motion.  No mass lesions identified.        Assessment    ICD-10-CM ICD-9-CM    1. Thyroid mass E07.9 246.9    2. Dysphagia, pharyngoesophageal phase R13.14 787.24 AMB REFERRAL TO GASTROENTEROLOGY        Plan  Her thyroid mass is on the right.  Food sticking is typically esophageal in nature, and is much more likely from a left-sided thyroid mass.  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  The clotting abnormality along with her MS makes her a complicated surgical patient.  I would like her to see gastroenterology for consideration of upper endoscopy and esophageal dilation.  Also, she agrees to discuss the swallowing issues with her neurologist in that they might be related to [place].  We reviewed that her fine-needle aspiration is benign and that the mass can certainly be followed.  If she did ultimately want removed, her surgery would need to be done at a tertiary care center and she would obviously need to be off Xarelto.    Seen by : Dayton Scrape, MD 10/09/2017

## 2017-10-09 NOTE — Progress Notes (Signed)
Per Dr. Maureen Chatters schedule appt with [facility] for dysphagia/food sticking. Called [phone] and spoke with Dondra Spry they will review referral and call Julia Burgess. She would like any day between 9:00 a.m. To 12:00 p.m.

## 2017-11-17 ENCOUNTER — Telehealth: Payer: Self-pay

## 2017-11-17 NOTE — Telephone Encounter (Signed)
Julia Burgess is calling to cancel her appointment which is currently scheduled for 11/21/17.    Has the appointment been cancelled? yes    Has the appointment been rescheduled? no    Patient requesting any day after 7/16     Does the patient need a call back to reschedule?no    Patient can be contacted back at [phone]

## 2017-11-21 ENCOUNTER — Ambulatory Visit: Payer: Medicare Other | Admitting: Internal Medicine

## 2018-02-20 ENCOUNTER — Encounter: Payer: Self-pay | Admitting: Radiology

## 2018-02-20 DIAGNOSIS — R079 Chest pain, unspecified: Secondary | ICD-10-CM

## 2018-02-26 LAB — HM MAMMOGRAPHY

## 2018-09-24 ENCOUNTER — Encounter: Payer: Self-pay | Admitting: Gastroenterology

## 2018-12-04 ENCOUNTER — Other Ambulatory Visit
Admission: RE | Admit: 2018-12-04 | Discharge: 2018-12-04 | Disposition: A | Discharge status: Home / Self Care | Payer: Medicare Other | Source: Ambulatory Visit

## 2018-12-05 LAB — COVID-19 NAAT (PCR): COVID-19 NAAT (PCR): 0

## 2018-12-31 LAB — UNMAPPED LAB RESULTS
Hematocrit (HT): 43 % — NL (ref 35–47)
Hemoglobin (HGB) (HT): 13.9 g/dL — NL (ref 12.0–16.0)

## 2019-01-02 DIAGNOSIS — Z9889 Other specified postprocedural states: Secondary | ICD-10-CM | POA: Insufficient documentation

## 2019-01-03 LAB — UNMAPPED LAB RESULTS
Hematocrit (HT): 33 % — ABNORMAL LOW (ref 35–47)
Hemoglobin (HGB) (HT): 10.7 g/dL — ABNORMAL LOW (ref 12.0–16.0)
MCHC (HT): 32.8 g/dL — NL (ref 31.0–37.5)
MCV (HT): 99 fL — NL (ref 80–100)
Mean Corpuscular Hemoglobin (MCH) (HT): 32.3 pg — NL (ref 26.0–34.0)
Platelets (HT): 310 10 3/uL — NL (ref 150–450)
RBC (HT): 3.31 10 6/uL — ABNORMAL LOW (ref 3.80–5.20)
RDW (HT): 14.5 % — NL (ref 0.0–15.2)
WBC (HT): 10.1 10 3/uL — NL (ref 4.0–11.0)

## 2019-01-04 LAB — UNMAPPED LAB RESULTS
Hematocrit (HT): 32 % — ABNORMAL LOW (ref 35–47)
Hemoglobin (HGB) (HT): 10.9 g/dL — ABNORMAL LOW (ref 12.0–16.0)
MCHC (HT): 33.6 g/dL — NL (ref 31.0–37.5)
MCV (HT): 98 fL — NL (ref 80–100)
Mean Corpuscular Hemoglobin (MCH) (HT): 32.8 pg — NL (ref 26.0–34.0)
Platelets (HT): 306 10 3/uL — NL (ref 150–450)
RBC (HT): 3.32 10 6/uL — ABNORMAL LOW (ref 3.80–5.20)
RDW (HT): 14.6 % — NL (ref 0.0–15.2)
WBC (HT): 9.2 10 3/uL — NL (ref 4.0–11.0)

## 2019-01-06 LAB — UNMAPPED LAB RESULTS
Hematocrit (HT): 31 % — ABNORMAL LOW (ref 35–47)
Hemoglobin (HGB) (HT): 10.1 g/dL — ABNORMAL LOW (ref 12.0–16.0)
MCHC (HT): 32.3 g/dL — NL (ref 31.0–37.5)
MCV (HT): 101 fL — ABNORMAL HIGH (ref 80–100)
Mean Corpuscular Hemoglobin (MCH) (HT): 32.5 pg — NL (ref 26.0–34.0)
Platelets (HT): 268 10 3/uL — NL (ref 150–450)
RBC (HT): 3.11 10 6/uL — ABNORMAL LOW (ref 3.80–5.20)
RDW (HT): 14.7 % — NL (ref 0.0–15.2)
WBC (HT): 8.7 10 3/uL — NL (ref 4.0–11.0)

## 2019-01-07 LAB — UNMAPPED LAB RESULTS
Hematocrit (HT): 32 % — ABNORMAL LOW (ref 35–47)
Hemoglobin (HGB) (HT): 10.4 g/dL — ABNORMAL LOW (ref 12.0–16.0)
MCHC (HT): 32.6 g/dL — NL (ref 31.0–37.5)
MCV (HT): 100 fL — NL (ref 80–100)
Mean Corpuscular Hemoglobin (MCH) (HT): 32.5 pg — NL (ref 26.0–34.0)
Platelets (HT): 168 10 3/uL — NL (ref 150–450)
RBC (HT): 3.2 10 6/uL — ABNORMAL LOW (ref 3.80–5.20)
RDW (HT): 14.7 % — NL (ref 0.0–15.2)
WBC (HT): 8.9 10 3/uL — NL (ref 4.0–11.0)

## 2019-03-27 ENCOUNTER — Encounter: Payer: Self-pay | Admitting: Gastroenterology

## 2019-03-27 NOTE — Progress Notes (Signed)
Received referral from [place] [facility] [address], [place] [address]     [phone])  [phone]      In review

## 2019-04-08 ENCOUNTER — Telehealth: Payer: Self-pay
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
## 2019-04-24 ENCOUNTER — Telehealth: Payer: Self-pay
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
## 2019-04-26 ENCOUNTER — Telehealth: Payer: Self-pay

## 2019-04-26 LAB — UNMAPPED LAB RESULTS
ABO RH Blood Type (HT): A POS — NL
Antibody Screen (HT): NEGATIVE — NL
Hematocrit (HT): 41 % — NL (ref 35–47)
Hemoglobin (HGB) (HT): 13.1 g/dL — NL (ref 12.0–16.0)
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
## 2019-05-01 ENCOUNTER — Telehealth: Payer: Self-pay
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
## 2019-05-02 DIAGNOSIS — Z96641 Presence of right artificial hip joint: Secondary | ICD-10-CM | POA: Insufficient documentation

## 2019-05-02 DIAGNOSIS — Z96642 Presence of left artificial hip joint: Secondary | ICD-10-CM | POA: Insufficient documentation

## 2019-05-02 LAB — UNMAPPED LAB RESULTS
Hematocrit (HT): 33 % — ABNORMAL LOW (ref 35–47)
Hemoglobin (HGB) (HT): 10.6 g/dL — ABNORMAL LOW (ref 12.0–16.0)
MCHC (HT): 32.4 g/dL — NL (ref 31.0–37.5)
MCV (HT): 93 fL — NL (ref 80–100)
Mean Corpuscular Hemoglobin (MCH) (HT): 30.1 pg — NL (ref 26.0–34.0)
Platelets (HT): 411 10 3/uL — NL (ref 150–450)
RBC (HT): 3.52 10 6/uL — ABNORMAL LOW (ref 3.80–5.20)
RDW (HT): 15.7 % — ABNORMAL HIGH (ref 0.0–15.2)
WBC (HT): 11.5 10 3/uL — ABNORMAL HIGH (ref 4.0–11.0)

## 2019-10-29 ENCOUNTER — Other Ambulatory Visit: Payer: Self-pay

## 2019-10-29 ENCOUNTER — Ambulatory Visit: Payer: Self-pay | Admitting: Primary Care

## 2019-10-29 DIAGNOSIS — K219 Gastro-esophageal reflux disease without esophagitis: Secondary | ICD-10-CM | POA: Insufficient documentation

## 2019-10-29 DIAGNOSIS — D689 Coagulation defect, unspecified: Secondary | ICD-10-CM | POA: Insufficient documentation

## 2019-10-29 DIAGNOSIS — E785 Hyperlipidemia, unspecified: Secondary | ICD-10-CM | POA: Insufficient documentation

## 2019-10-29 DIAGNOSIS — J4520 Mild intermittent asthma, uncomplicated: Secondary | ICD-10-CM | POA: Insufficient documentation

## 2019-10-29 DIAGNOSIS — J45909 Unspecified asthma, uncomplicated: Secondary | ICD-10-CM | POA: Insufficient documentation

## 2019-10-29 DIAGNOSIS — F419 Anxiety disorder, unspecified: Secondary | ICD-10-CM | POA: Insufficient documentation

## 2019-10-29 DIAGNOSIS — M5430 Sciatica, unspecified side: Secondary | ICD-10-CM | POA: Insufficient documentation

## 2019-10-29 DIAGNOSIS — G35 Multiple sclerosis: Secondary | ICD-10-CM | POA: Insufficient documentation

## 2019-10-29 DIAGNOSIS — M5126 Other intervertebral disc displacement, lumbar region: Secondary | ICD-10-CM | POA: Insufficient documentation

## 2019-10-29 DIAGNOSIS — F330 Major depressive disorder, recurrent, mild: Secondary | ICD-10-CM | POA: Insufficient documentation

## 2019-10-29 DIAGNOSIS — G8929 Other chronic pain: Secondary | ICD-10-CM | POA: Insufficient documentation

## 2019-10-29 DIAGNOSIS — R1310 Dysphagia, unspecified: Secondary | ICD-10-CM | POA: Insufficient documentation

## 2019-10-29 DIAGNOSIS — E7800 Pure hypercholesterolemia, unspecified: Secondary | ICD-10-CM | POA: Insufficient documentation

## 2019-10-29 DIAGNOSIS — E039 Hypothyroidism, unspecified: Secondary | ICD-10-CM | POA: Insufficient documentation

## 2019-10-29 DIAGNOSIS — I10 Essential (primary) hypertension: Secondary | ICD-10-CM | POA: Insufficient documentation

## 2019-10-29 DIAGNOSIS — K5900 Constipation, unspecified: Secondary | ICD-10-CM | POA: Insufficient documentation

## 2019-10-30 ENCOUNTER — Ambulatory Visit: Payer: Self-pay | Admitting: Primary Care

## 2019-11-13 ENCOUNTER — Ambulatory Visit: Payer: Medicare (Managed Care) | Admitting: Primary Care

## 2019-11-13 ENCOUNTER — Encounter: Payer: Self-pay | Admitting: Primary Care

## 2019-11-13 VITALS — BP 128/88 | HR 78 | Temp 97.7°F | Ht 59.5 in | Wt 165.1 lb

## 2019-11-13 DIAGNOSIS — M5126 Other intervertebral disc displacement, lumbar region: Secondary | ICD-10-CM

## 2019-11-13 DIAGNOSIS — Z114 Encounter for screening for human immunodeficiency virus [HIV]: Secondary | ICD-10-CM

## 2019-11-13 DIAGNOSIS — G35 Multiple sclerosis: Secondary | ICD-10-CM

## 2019-11-13 DIAGNOSIS — E039 Hypothyroidism, unspecified: Secondary | ICD-10-CM

## 2019-11-13 DIAGNOSIS — E785 Hyperlipidemia, unspecified: Secondary | ICD-10-CM

## 2019-11-13 DIAGNOSIS — Z1159 Encounter for screening for other viral diseases: Secondary | ICD-10-CM

## 2019-11-13 MED ORDER — ENOXAPARIN SODIUM 60 MG/0.6ML IJ SOSY *I*
60.0000 mg | PREFILLED_SYRINGE | Freq: Two times a day (BID) | INTRAMUSCULAR | 0 refills | Status: DC
Start: 2019-11-13 — End: 2019-11-15

## 2019-11-13 NOTE — Progress Notes (Addendum)
Julia Burgess is a 59 y.o. female (15-Aug-1960)    Nurses note:   New Patient Visit      CC: This patient presents today as a new patient to this medical practice.    HPI: This is a 59 year old female patient with a remarkable past medical history consisting multiple sclerosis,  previous right hip replacement, subsequently developed osteonecrosis of the right femur.  Her history is also noted for chronic DVT without an identifiable underlying cause, previous benign mass at the base of her tongue, she has a history of chronic low back pain as result of lumbar stenosis and is looking at lumbar revision surgery in 2 weeks.  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  She presents today to establish medical care.  She is on multiple medications including atorvastatin, Topamax, Dyazide, Xarelto, dronabinol, Copaxone, levothyroxine and gabapentin.  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  Denies any illicit drug usage.  She is married with 4 kids.  She has multiple treating physicians including orthopedic spine surgeon, a general orthopedist and a neurologist who unfortunately recently retired.  She denies any headaches, chest pains, shortness of breath, fevers, chills or night sweats.  She has no abdominal pains, diarrhea constipation.  Her weight is fairly stable.  She admits to chronic hip and low back pain.  She walks with a cane.  Review of her recent sed rate and CRP were both negative.    ROS  As per HPI    Allergies   Allergen Reactions    Bee Venom Anaphylaxis    Ketorolac Other (See Comments)     Muscle spasms    Lactase Other (See Comments)     GI upset    Meperidine Nausea And Vomiting and Other (See Comments)    Prochlorperazine Other (See Comments)     myoclonic         Current Outpatient Medications   Medication    atorvastatin (LIPITOR) 20 MG tablet    topiramate (TOPAMAX) 25 MG tablet    triamterene-hydroCHLOROthiazide (MAXZIDE-25)  37.5-25 MG per tablet    rivaroxaban (XARELTO) 20 MG tablet    dronabinol (MARINOL) 2.5 MG capsule    COPAXONE 40 MG/ML prefilled syringe    levothyroxine (SYNTHROID, LEVOTHROID) 100 MCG tablet    gabapentin (NEURONTIN) 300 MG capsule    enoxaparin (LOVENOX) 60 mg/0.95mL injection     No current facility-administered medications for this visit.       Past Medical History:   Diagnosis Date    Arthritis     Asthma     Chronic hypokalemia     Chronic low back pain     Depression     DVT (deep venous thrombosis)     Dysphagia     GERD (gastroesophageal reflux disease)     Heart disease     Hypercholesterolemia     Hypertension     Hypothyroidism     Multiple sclerosis     Multiple sclerosis, primary progressive     Osteoarthritis     Tobacco abuse      Social History     Socioeconomic History    Marital status: Married     Spouse name: Not on file    Number of children: Not on file    Years of education: Not on file    Highest education level: Not on file   Occupational History    Not  on file   Tobacco Use    Smoking status: Current Every Day Smoker     Packs/day: 1.00     Years: 30.00     Pack years: 30.00     Types: Cigarettes     Start date: 04/08/1980    Smokeless tobacco: Never Used    Tobacco comment: just recently quit   Substance and Sexual Activity    Alcohol use: Yes     Alcohol/week: 4.0 - 6.0 standard drinks     Types: 4 - 6 Cans of beer per week    Drug use: Never    Sexual activity: Not Currently     Partners: Male     Birth control/protection: Post-menopausal   Social History Narrative    Not on file         Vitals:    11/13/19 1531   BP: 128/88   Pulse: 78   Temp: 36.5 C (97.7 F)   Weight: 74.9 kg (165 lb 1.6 oz)   Height: 1.511 m (4' 11.5")     Body mass index is 32.79 kg/m.    Physical Exam   On examination, the patient's blood pressure is 128/88 with a pulse of 78 and she is afebrile.  Her BMI is 32%.  Her head and neck examination reveals no visible oral lesion or  thyromegaly.  She has no adenopathy.  Chest reveals inspiratory and expiratory rhonchi in the mid lung fields to the apex but no rales or wheezes and her heart exam is regular in rate and rhythm.  Her abdomen is soft without tenderness, rebound or guarding and extremities reveal no clubbing, cyanosis nor edema.  Pulses are present bilaterally.  She has mild to moderate tenderness of the lumbar spine and has difficulty lying in a supine position because of pain.  She has difficulty climbing onto the exam table because of pain and weakness.    Assessment/Plan    1. Multiple sclerosis  The patient has a history of MS.  Is planning on seeing a new neurologist to follow her for this neurological condition.  She has limited mobility.  The patient cannot independently make changes in body position significant enough to alleviate her pressure.  She is therefore in need of a hospital mattress.    2. Dyslipidemia  She suffers from dyslipidemia.  I will check a lipid profile in the future.  She was advised to continue current dose of atorvastatin.    3. Hypothyroidism, unspecified type  She suffers from hypothyroidism.  I will repeat her thyroid function studies.  She will continue her current dose of levothyroxine.  - TSH; Future    4. Encounter for screening for HIV  She is in need of an HIV screening test and this was requested on today's visit.  - HIV 1&2 antigen/antibody; Future    5. Encounter for HCV screening test for low risk patient  She is in need of an HCV screening test and was requested on today's visit.  - Hepatitis C antibody; Future    6. Lumbar disc herniation  She has lumbar disc disease and bilateral neuroforaminal stenosis.  She is planning on having low back surgery in the next 2 weeks.  I recommend bridging herself with Lovenox 60 mg subcu twice daily after stopping the Xarelto 2 days prior.  - CBC; Future  - Comprehensive metabolic panel; Future  - Urinalysis with reflex to culture; Future  - Urinalysis  with reflex to culture; Future  No follow-ups on file.

## 2019-11-15 ENCOUNTER — Other Ambulatory Visit: Payer: Self-pay | Admitting: Primary Care

## 2019-11-15 MED ORDER — ENOXAPARIN SODIUM 40 MG/0.4ML IJ SOSY *I*
40.0000 mg | PREFILLED_SYRINGE | Freq: Every day | INTRAMUSCULAR | 0 refills | Status: DC
Start: 2019-11-15 — End: 2020-02-10

## 2019-11-19 ENCOUNTER — Encounter: Payer: Self-pay | Admitting: Gastroenterology

## 2019-11-22 ENCOUNTER — Other Ambulatory Visit: Payer: Self-pay | Admitting: Primary Care

## 2019-11-22 LAB — COMPREHENSIVE METABOLIC PANEL
ALT: 19 U/L (ref 0–33)
ALT: 19 U/L (ref 0–33)
AST: 18 U/L (ref 0–32)
AST: 18 U/L (ref 0–32)
Albumin: 4.8 GM/DL (ref 3.5–5.2)
Albumin: 4.8 GM/DL (ref 3.5–5.2)
Alk Phos: 84 U/L (ref 35–104)
Alk Phos: 84 U/L (ref 35–104)
Anion Gap: 10
Anion Gap: 10
Bilirubin,Total: 0.4 MG/DL (ref 0.0–1.0)
Bilirubin,Total: 0.4 MG/DL (ref 0.0–1.0)
CO2: 28 MMOL/L (ref 22–29)
CO2: 28 MMOL/L (ref 22–29)
Calcium: 9.7 MG/DL (ref 8.6–10.2)
Calcium: 9.7 MG/DL (ref 8.6–10.2)
Chloride: 99 MMOL/L (ref 98–107)
Chloride: 99 MMOL/L (ref 98–107)
Creatinine: 0.8 MG/DL (ref 0.5–0.9)
Creatinine: 0.8 MG/DL (ref 0.5–0.9)
GFR,Black: 89 (ref 51–120)
GFR,Black: 89 (ref 51–120)
GFR,Caucasian: 74 (ref 51–120)
GFR,Caucasian: 74 (ref 51–120)
Glucose: 116 MG/DL — ABNORMAL HIGH (ref 70–100)
Glucose: 116 MG/DL — ABNORMAL HIGH (ref 70–100)
Lab: 12 MG/DL (ref 6–20)
Lab: 12 MG/DL (ref 6–20)
Potassium: 3.7 MMOL/L (ref 3.5–5.1)
Potassium: 3.7 MMOL/L (ref 3.5–5.1)
Sodium: 137 MMOL/L (ref 136–145)
Sodium: 137 MMOL/L (ref 136–145)
Total Protein: 7.3 GM/DL (ref 6.6–8.7)
Total Protein: 7.3 GM/DL (ref 6.6–8.7)

## 2019-11-22 LAB — CBC
Hematocrit: 42 % (ref 37.5–47.7)
Hemoglobin: 13.7 g/dL (ref 12.1–15.8)
MCH: 32.6 PG (ref 26.4–33.6)
MCHC: 32.6 G/DL (ref 31.9–37.3)
MCV: 100 FL — ABNORMAL HIGH (ref 80–93)
Mean Platelet Volume: 8.5 FL — ABNORMAL LOW (ref 10.1–10.4)
Platelets: 429 10*3 — ABNORMAL HIGH (ref 144–366)
RBC Distribution Width-SD: 53.3 FL — ABNORMAL HIGH (ref 41.0–45.3)
RBC: 4.2 X10 6 (ref 4.16–5.34)
RDW: 14.8 % — ABNORMAL HIGH (ref 12.8–14.2)
WBC: 7.8 10*3 (ref 4.3–11.0)

## 2019-11-22 LAB — URINALYSIS WITH REFLEX TO MICROSCOPIC
Bilirubin,Ur: NEGATIVE
Blood,UA: NEGATIVE
Glucose, Ur: NEGATIVE
Ketones, UA: NEGATIVE
Leuk Esterase,UA: NEGATIVE
Nitrite,UA: NEGATIVE
Protein,UA: 30 — ABNORMAL HIGH
Specific Gravity,UA: 1.018 (ref 1.005–1.025)
Urobilinogen,UA: 2 — ABNORMAL HIGH (ref 0.2–1.0)
pH,UR: 7 (ref 5.0–8.0)

## 2019-11-22 LAB — UNMAPPED LAB RESULTS
ABO RH Blood Type (HT): A POS — NL
Antibody Screen (HT): NEGATIVE — NL
Hematocrit (HT): 42 % — NL (ref 35–47)
Hemoglobin (HGB) (HT): 14 g/dL — NL (ref 12.0–16.0)
Platelets (HT): 429 10 3/uL — NL (ref 150–450)

## 2019-11-22 LAB — HIV: HIV: NONREACTIVE

## 2019-11-22 LAB — HEPATITIS C ANTIBODY: Hep C Ab: NONREACTIVE

## 2019-11-22 LAB — TSH: TSH: 0.65 u[IU]/mL (ref 0.27–4.20)

## 2019-11-26 ENCOUNTER — Other Ambulatory Visit: Payer: Self-pay | Admitting: Gastroenterology

## 2019-11-26 LAB — UNMAPPED LAB RESULTS
Hematocrit (HT): 38 % — NL (ref 35–47)
Hemoglobin (HGB) (HT): 12.6 g/dL — NL (ref 12.0–16.0)
MCHC (HT): 33.4 g/dL — NL (ref 31.0–37.5)
MCV (HT): 96 fL — NL (ref 80–100)
Mean Corpuscular Hemoglobin (MCH) (HT): 32.1 pg — NL (ref 26.0–34.0)
Platelets (HT): 378 10 3/uL — NL (ref 150–450)
RBC (HT): 3.93 10 6/uL — NL (ref 3.80–5.20)
RDW (HT): 14 % — NL (ref 0.0–15.2)
WBC (HT): 6.3 10 3/uL — NL (ref 4.0–11.0)

## 2019-11-27 ENCOUNTER — Encounter: Payer: Self-pay | Admitting: Gastroenterology

## 2019-11-27 LAB — UNMAPPED LAB RESULTS
Hematocrit (HT): 32 % — ABNORMAL LOW (ref 35–47)
Hemoglobin (HGB) (HT): 10.9 g/dL — ABNORMAL LOW (ref 12.0–16.0)
MCHC (HT): 34 g/dL — NL (ref 31.0–37.5)
MCV (HT): 98 fL — NL (ref 80–100)
Mean Corpuscular Hemoglobin (MCH) (HT): 33.1 pg — NL (ref 26.0–34.0)
Platelets (HT): 349 10 3/uL — NL (ref 150–450)
RBC (HT): 3.29 10 6/uL — ABNORMAL LOW (ref 3.80–5.20)
RDW (HT): 14.2 % — NL (ref 0.0–15.2)
WBC (HT): 13.3 10 3/uL — ABNORMAL HIGH (ref 4.0–11.0)

## 2019-11-28 LAB — UNMAPPED LAB RESULTS
Hematocrit (HT): 32 % — ABNORMAL LOW (ref 35–47)
Hemoglobin (HGB) (HT): 10.6 g/dL — ABNORMAL LOW (ref 12.0–16.0)
MCHC (HT): 33 g/dL — NL (ref 31.0–37.5)
MCV (HT): 98 fL — NL (ref 80–100)
Mean Corpuscular Hemoglobin (MCH) (HT): 32.4 pg — NL (ref 26.0–34.0)
Platelets (HT): 354 10 3/uL — NL (ref 150–450)
RBC (HT): 3.27 10 6/uL — ABNORMAL LOW (ref 3.80–5.20)
RDW (HT): 14.3 % — NL (ref 0.0–15.2)
WBC (HT): 12.9 10 3/uL — ABNORMAL HIGH (ref 4.0–11.0)

## 2019-11-29 LAB — UNMAPPED LAB RESULTS
Hematocrit (HT): 30 % — ABNORMAL LOW (ref 35–47)
Hemoglobin (HGB) (HT): 9.9 g/dL — ABNORMAL LOW (ref 12.0–16.0)
MCHC (HT): 33.3 g/dL — NL (ref 31.0–37.5)
MCV (HT): 97 fL — NL (ref 80–100)
Mean Corpuscular Hemoglobin (MCH) (HT): 32.5 pg — NL (ref 26.0–34.0)
Platelets (HT): 351 10 3/uL — NL (ref 150–450)
RBC (HT): 3.05 10 6/uL — ABNORMAL LOW (ref 3.80–5.20)
RDW (HT): 14.6 % — NL (ref 0.0–15.2)
WBC (HT): 11.5 10 3/uL — ABNORMAL HIGH (ref 4.0–11.0)

## 2019-12-03 ENCOUNTER — Telehealth: Payer: Self-pay | Admitting: Primary Care

## 2019-12-03 ENCOUNTER — Encounter: Payer: Self-pay | Admitting: Gastroenterology

## 2019-12-03 MED ORDER — SILVER SULFADIAZINE 1 % EX CREA *I*
TOPICAL_CREAM | Freq: Every day | CUTANEOUS | 1 refills | Status: DC
Start: 2019-12-03 — End: 2020-06-16

## 2019-12-03 NOTE — Telephone Encounter (Signed)
Apply Silvadene cream to burn area twice daily then apply nonadhesive gauze.
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
## 2019-12-04 ENCOUNTER — Other Ambulatory Visit: Payer: Self-pay | Admitting: Primary Care

## 2019-12-04 MED ORDER — NICOTINE 14 MG/24HR TD PT24 *I*
1.0000 | MEDICATED_PATCH | Freq: Every day | TRANSDERMAL | 2 refills | Status: DC
Start: 2019-12-04 — End: 2020-11-04

## 2019-12-04 NOTE — Telephone Encounter (Signed)
Melissa called back and advised her

## 2019-12-04 NOTE — Telephone Encounter (Signed)
She needs:    Nicotine patches -step 2    Send to [employer]

## 2019-12-04 NOTE — Telephone Encounter (Signed)
All set to send

## 2019-12-12 ENCOUNTER — Other Ambulatory Visit: Payer: Self-pay

## 2019-12-12 LAB — UNMAPPED LAB RESULTS
Basophil # (HT): 0.1 10 3/uL (ref 0.0–0.2)
Basophil % (HT): 1 % (ref 0–3)
Eosinophil # (HT): 1.1 10 3/uL — ABNORMAL HIGH (ref 0.0–0.6)
Eosinophil % (HT): 10 % — ABNORMAL HIGH (ref 0–5)
Hematocrit (HT): 32 % — ABNORMAL LOW (ref 35–47)
Hemoglobin (HGB) (HT): 10.7 g/dL — ABNORMAL LOW (ref 12.0–16.0)
Lymphocyte # (HT): 2.3 10 3/uL (ref 1.0–4.8)
Lymphocyte % (HT): 21 % (ref 15–45)
MCHC (HT): 33.1 g/dL (ref 31.0–37.5)
MCV (HT): 94 fL (ref 80–100)
Mean Corpuscular Hemoglobin (MCH) (HT): 31 pg (ref 26.0–34.0)
Monocyte # (HT): 0.7 10 3/uL (ref 0.1–1.0)
Monocyte % (HT): 7 % (ref 0–15)
Neutrophil # (HT): 6.6 10 3/uL (ref 1.8–8.0)
Platelets (HT): 578 10 3/uL — ABNORMAL HIGH (ref 150–450)
RBC (HT): 3.45 10 6/uL — ABNORMAL LOW (ref 3.80–5.20)
RDW (HT): 14.1 % (ref 0.0–15.2)
Seg Neut % (HT): 61 % (ref 45–75)
WBC (HT): 10.8 10 3/uL (ref 4.0–11.0)

## 2019-12-12 LAB — CBC AND DIFFERENTIAL
Baso # K/uL: 0.1 10*3/uL (ref 0.0–0.2)
Basophil %: 1 % (ref 0–3)
Eos # K/uL: 1.1 10*3/uL — ABNORMAL HIGH (ref 0.0–0.6)
Eosinophil %: 10 % — ABNORMAL HIGH (ref 0–5)
Hematocrit: 32 % — ABNORMAL LOW (ref 35–47)
Hemoglobin: 10.7 g/dL — ABNORMAL LOW (ref 12.0–16.0)
Lymph # K/uL: 2.3 10*3/uL (ref 1.0–4.8)
Lymphocyte %: 21 % (ref 15–45)
MCH: 31 pg (ref 26.0–34.0)
MCHC: 33.1 g/dL (ref 31.0–37.5)
MCV: 94 fL (ref 80–100)
Mono # K/uL: 0.7 10*3/uL (ref 0.1–1.0)
Monocyte %: 7 % (ref 0–15)
Neut # K/uL: 6.6 10*3/uL (ref 1.8–8.0)
Platelets: 578 10*3/uL — ABNORMAL HIGH (ref 150–450)
RBC: 3.45 10*6/uL — ABNORMAL LOW (ref 3.80–5.20)
RDW: 14.1 % (ref 0.0–15.2)
Seg Neut %: 61 % (ref 45–75)
WBC: 10.8 10*3/uL (ref 4.0–11.0)

## 2019-12-12 LAB — PROTIME-INR
INR: 1.6 — ABNORMAL HIGH (ref 0.9–1.1)
Protime: 18.2 s — ABNORMAL HIGH (ref 10.2–12.9)

## 2019-12-12 LAB — BASIC METABOLIC PANEL
Anion Gap: 11 meq/L (ref 4–16)
CO2: 27 meq/L (ref 22–30)
Calcium: 9.6 mg/dL (ref 8.5–10.2)
Chloride: 96 meq/L — ABNORMAL LOW (ref 98–108)
Creatinine: 0.6 mg/dL (ref 0.5–0.9)
Glucose: 95 mg/dL (ref 65–100)
Lab: 9 mg/dL (ref 8–20)
Potassium: 3.6 meq/L (ref 3.5–5.1)
Sodium: 134 meq/L — ABNORMAL LOW (ref 135–145)

## 2019-12-12 LAB — SEDIMENTATION RATE, AUTOMATED: Sedimentation Rate: 28 mm/h (ref 0–30)

## 2019-12-12 LAB — ESTIMATED GFR
GFR,Black: 60 mL/min
GFR,Caucasian: 60 mL/min

## 2019-12-12 LAB — HIGH SENSITIVITY CRP: CRP,High Sensitivity: 6.2 mg/L — ABNORMAL HIGH (ref 0.0–3.0)

## 2019-12-17 ENCOUNTER — Telehealth: Payer: Self-pay | Admitting: Primary Care

## 2019-12-17 DIAGNOSIS — G35 Multiple sclerosis: Secondary | ICD-10-CM

## 2019-12-17 MED ORDER — GENERIC DME *A*
0 refills | Status: DC
Start: 2019-12-17 — End: 2020-11-04

## 2019-12-17 NOTE — Telephone Encounter (Signed)
Can you sign script for Dr. Dallie Piles?

## 2019-12-17 NOTE — Telephone Encounter (Signed)
Respiratory services called and stated that the family reached out to them to get a gel overlay mattress for her hospital bed. They are looking to get a script. Can you please do a script and we can fax it to the number provided

## 2019-12-19 ENCOUNTER — Telehealth: Payer: Self-pay | Admitting: Primary Care

## 2019-12-19 ENCOUNTER — Other Ambulatory Visit: Payer: Self-pay

## 2019-12-19 ENCOUNTER — Other Ambulatory Visit: Payer: Self-pay | Admitting: Gastroenterology

## 2019-12-19 LAB — UNMAPPED LAB RESULTS
ABO RH Blood Type (HT): A POS
Antibody Screen (HT): NEGATIVE
Hematocrit (HT): 43 % (ref 35–47)
Hemoglobin (HGB) (HT): 14.6 g/dL (ref 12.0–16.0)
MCHC (HT): 33.6 g/dL (ref 31.0–37.5)
MCV (HT): 92 fL (ref 80–100)
Mean Corpuscular Hemoglobin (MCH) (HT): 31 pg (ref 26.0–34.0)
Platelets (HT): 468 10 3/uL — ABNORMAL HIGH (ref 150–450)
RBC (HT): 4.71 10 6/uL (ref 3.80–5.20)
RDW (HT): 13.9 % (ref 0.0–15.2)
WBC (HT): 13.4 10 3/uL — ABNORMAL HIGH (ref 4.0–11.0)

## 2019-12-19 LAB — CBC
Hematocrit: 43 % (ref 35–47)
Hemoglobin: 14.6 g/dL (ref 12.0–16.0)
MCH: 31 pg (ref 26.0–34.0)
MCHC: 33.6 g/dL (ref 31.0–37.5)
MCV: 92 fL (ref 80–100)
Platelets: 468 10*3/uL — ABNORMAL HIGH (ref 150–450)
RBC: 4.71 10*6/uL (ref 3.80–5.20)
RDW: 13.9 % (ref 0.0–15.2)
WBC: 13.4 10*3/uL — ABNORMAL HIGH (ref 4.0–11.0)

## 2019-12-19 LAB — BASIC METABOLIC PANEL
Anion Gap: 12 meq/L (ref 4–16)
CO2: 22 meq/L (ref 22–30)
Calcium: 10.8 mg/dL — ABNORMAL HIGH (ref 8.5–10.2)
Chloride: 98 meq/L (ref 98–108)
Creatinine: 0.6 mg/dL (ref 0.5–0.9)
Glucose: 105 mg/dL — ABNORMAL HIGH (ref 65–100)
Lab: 12 mg/dL (ref 8–20)
Sodium: 132 meq/L — ABNORMAL LOW (ref 135–145)

## 2019-12-19 LAB — TYPE AND SCREEN
ABO RH Blood Type: A POS
Antibody Screen: NEGATIVE

## 2019-12-19 LAB — ESTIMATED GFR
GFR,Black: 60 mL/min
GFR,Caucasian: 60 mL/min

## 2019-12-19 NOTE — Telephone Encounter (Signed)
Correction:  You need to do an addendum to your note on 11/13/19 for the gel overlay mattress.      DX: Multiple Sclerosis     Note needs to say: Limited mobility, patient cannot independently make changes in body position significant enough to alleviate pressure

## 2019-12-19 NOTE — Telephone Encounter (Signed)
Dr. Dallie Piles needs to do an addendum to his last office note for the gel overlay mattress script.    Dx: multiple sclerosis    Notes needs to say:  Limited mobility.  Can independently make body position without assistance, significant enough to alleviate pressure

## 2019-12-20 ENCOUNTER — Encounter: Payer: Self-pay | Admitting: Gastroenterology

## 2019-12-20 LAB — UNMAPPED LAB RESULTS
Hematocrit (HT): 40 % (ref 35–47)
Hemoglobin (HGB) (HT): 13 g/dL (ref 12.0–16.0)
MCHC (HT): 32.3 g/dL (ref 31.0–37.5)
MCV (HT): 96 fL (ref 80–100)
Mean Corpuscular Hemoglobin (MCH) (HT): 30.9 pg (ref 26.0–34.0)
Platelets (HT): 402 10 3/uL (ref 150–450)
RBC (HT): 4.21 10 6/uL (ref 3.80–5.20)
RDW (HT): 14.3 % (ref 0.0–15.2)
WBC (HT): 10.4 10 3/uL (ref 4.0–11.0)

## 2019-12-21 ENCOUNTER — Other Ambulatory Visit: Payer: Self-pay | Admitting: Gastroenterology

## 2019-12-27 LAB — UNMAPPED LAB RESULTS
Basophil # (HT): 0 10 3/uL (ref 0.0–0.2)
Basophil % (HT): 0 % (ref 0–3)
Eosinophil # (HT): 0 10 3/uL (ref 0.0–0.6)
Eosinophil % (HT): 0 % (ref 0–5)
Hematocrit (HT): 37 % (ref 35–47)
Hemoglobin (HGB) (HT): 12.8 g/dL (ref 12.0–16.0)
Lymphocyte # (HT): 1.5 10 3/uL (ref 1.0–4.8)
Lymphocyte % (HT): 10 % — ABNORMAL LOW (ref 15–45)
MCHC (HT): 35.1 g/dL (ref 31.0–37.5)
MCV (HT): 90 fL (ref 80–100)
Mean Corpuscular Hemoglobin (MCH) (HT): 31.6 pg (ref 26.0–34.0)
Monocyte # (HT): 1 10 3/uL (ref 0.1–1.0)
Monocyte % (HT): 7 % (ref 0–15)
Neutrophil # (HT): 12.1 10 3/uL — ABNORMAL HIGH (ref 1.8–8.0)
Platelets (HT): 387 10 3/uL (ref 150–450)
RBC (HT): 4.05 10 6/uL (ref 3.80–5.20)
RDW (HT): 13.3 % (ref 0.0–15.2)
Seg Neut % (HT): 81 % — ABNORMAL HIGH (ref 45–75)
WBC (HT): 14.9 10 3/uL — ABNORMAL HIGH (ref 4.0–11.0)

## 2019-12-31 NOTE — Telephone Encounter (Signed)
[facility] called this addendum needs to be done as soon as possible or they will have to charge patient for the mattress since they already gave it to her.

## 2019-12-31 NOTE — Telephone Encounter (Signed)
I addended note

## 2020-01-01 NOTE — Telephone Encounter (Signed)
Note faxed to Resp. services

## 2020-01-03 DIAGNOSIS — M8000XD Age-related osteoporosis with current pathological fracture, unspecified site, subsequent encounter for fracture with routine healing: Secondary | ICD-10-CM | POA: Insufficient documentation

## 2020-01-03 DIAGNOSIS — F32A Depression, unspecified: Secondary | ICD-10-CM | POA: Insufficient documentation

## 2020-01-03 DIAGNOSIS — M810 Age-related osteoporosis without current pathological fracture: Secondary | ICD-10-CM | POA: Insufficient documentation

## 2020-01-03 LAB — UNMAPPED LAB RESULTS
Basophil # (HT): 0 10 3/uL (ref 0.0–0.2)
Basophil % (HT): 0 % (ref 0–3)
Eosinophil # (HT): 0.6 10 3/uL (ref 0.0–0.6)
Eosinophil % (HT): 8 % — ABNORMAL HIGH (ref 0–5)
Hematocrit (HT): 37 % (ref 35–47)
Hemoglobin (HGB) (HT): 12.3 g/dL (ref 12.0–16.0)
Lymphocyte # (HT): 1.8 10 3/uL (ref 1.0–4.8)
Lymphocyte % (HT): 22 % (ref 15–45)
MCHC (HT): 33.2 g/dL (ref 31.0–37.5)
MCV (HT): 92 fL (ref 80–100)
Mean Corpuscular Hemoglobin (MCH) (HT): 30.7 pg (ref 26.0–34.0)
Monocyte # (HT): 0.8 10 3/uL (ref 0.1–1.0)
Monocyte % (HT): 11 % (ref 0–15)
Neutrophil # (HT): 4.6 10 3/uL (ref 1.8–8.0)
Platelets (HT): 324 10 3/uL (ref 150–450)
RBC (HT): 4.01 10 6/uL (ref 3.80–5.20)
RDW (HT): 13.6 % (ref 0.0–15.2)
Seg Neut % (HT): 58 % (ref 45–75)
WBC (HT): 7.9 10 3/uL (ref 4.0–11.0)

## 2020-01-05 DIAGNOSIS — M4848XD Fatigue fracture of vertebra, sacral and sacrococcygeal region, subsequent encounter for fracture with routine healing: Secondary | ICD-10-CM | POA: Insufficient documentation

## 2020-01-06 LAB — UNMAPPED LAB RESULTS
Basophil # (HT): 0 10 3/uL (ref 0.0–0.1)
Basophil % (HT): 0 % (ref 0–3)
Eosinophil # (HT): 0.6 10 3/uL (ref 0.0–0.8)
Eosinophil % (HT): 6 % — ABNORMAL HIGH (ref 0–5)
Hematocrit (HT): 37 % — ABNORMAL LOW (ref 38–48)
Hemoglobin (HGB) (HT): 12.6 g/dL (ref 11.9–15.9)
Lymphocyte # (HT): 1.8 10 3/uL (ref 0.6–3.3)
Lymphocyte % (HT): 18 % (ref 15–45)
MCHC (HT): 34.3 g/dL (ref 31.1–34.5)
MCV (HT): 90 fL (ref 80–97)
Mean Corpuscular Hemoglobin (MCH) (HT): 30.9 pg — ABNORMAL HIGH (ref 26.6–30.6)
Mean Platelet Volume (HT): 8.6 fL — ABNORMAL LOW (ref 9.0–12.2)
Monocyte # (HT): 0.6 10 3/uL (ref 0.1–1.1)
Monocyte % (HT): 6 % (ref 0–15)
Neutrophil # (HT): 6.8 10 3/uL (ref 2.0–7.1)
Platelets (HT): 423 10 3/uL — ABNORMAL HIGH (ref 142–414)
RBC (HT): 4.08 10 6/uL (ref 4.04–5.48)
RDW (HT): 13.8 % (ref 12.9–16.3)
Seg Neut % (HT): 69 % (ref 45–75)
WBC (HT): 10 10 3/uL (ref 4.8–10.4)

## 2020-01-09 LAB — UNMAPPED LAB RESULTS
Hematocrit (HT): 37 % — ABNORMAL LOW (ref 38–48)
Hemoglobin (HGB) (HT): 12.1 g/dL (ref 11.9–15.9)
MCHC (HT): 32.5 g/dL (ref 31.1–34.5)
MCV (HT): 94 fL (ref 80–97)
Mean Corpuscular Hemoglobin (MCH) (HT): 30.6 pg (ref 26.6–30.6)
Mean Platelet Volume (HT): 8.3 fL — ABNORMAL LOW (ref 9.0–12.2)
Platelets (HT): 490 10 3/uL — ABNORMAL HIGH (ref 142–414)
RBC (HT): 3.96 10 6/uL — ABNORMAL LOW (ref 4.04–5.48)
RDW (HT): 13.8 % (ref 12.9–16.3)
WBC (HT): 6.4 10 3/uL (ref 4.8–10.4)

## 2020-01-16 LAB — UNMAPPED LAB RESULTS
Hematocrit (HT): 38 % (ref 38–48)
Hemoglobin (HGB) (HT): 12.7 g/dL (ref 11.9–15.9)
MCHC (HT): 33.2 g/dL (ref 31.1–34.5)
MCV (HT): 92 fL (ref 80–97)
Mean Corpuscular Hemoglobin (MCH) (HT): 30.7 pg — ABNORMAL HIGH (ref 26.6–30.6)
Mean Platelet Volume (HT): 8.3 fL — ABNORMAL LOW (ref 9.0–12.2)
Platelets (HT): 633 10 3/uL — ABNORMAL HIGH (ref 142–414)
RBC (HT): 4.14 10 6/uL (ref 4.04–5.48)
RDW (HT): 13.9 % (ref 12.9–16.3)
WBC (HT): 6.8 10 3/uL (ref 4.8–10.4)

## 2020-01-23 LAB — UNMAPPED LAB RESULTS
Hematocrit (HT): 39 % (ref 38–48)
Hemoglobin (HGB) (HT): 13 g/dL (ref 11.9–15.9)
MCHC (HT): 33 g/dL (ref 31.1–34.5)
MCV (HT): 92 fL (ref 80–97)
Mean Corpuscular Hemoglobin (MCH) (HT): 30.2 pg (ref 26.6–30.6)
Mean Platelet Volume (HT): 8.5 fL — ABNORMAL LOW (ref 9.0–12.2)
Platelets (HT): 416 10 3/uL — ABNORMAL HIGH (ref 142–414)
RBC (HT): 4.3 10 6/uL (ref 4.04–5.48)
RDW (HT): 13.8 % (ref 12.9–16.3)
WBC (HT): 9.2 10 3/uL (ref 4.8–10.4)

## 2020-01-30 ENCOUNTER — Other Ambulatory Visit: Payer: Self-pay | Admitting: Gastroenterology

## 2020-01-30 LAB — UNMAPPED LAB RESULTS
Basophil # (HT): 0.1 10 3/uL (ref 0.0–0.1)
Basophil % (HT): 1 % (ref 0–3)
Eosinophil # (HT): 0.8 10 3/uL (ref 0.0–0.8)
Eosinophil % (HT): 8 % — ABNORMAL HIGH (ref 0–5)
Hematocrit (HT): 34 % — ABNORMAL LOW (ref 38–48)
Hemoglobin (HGB) (HT): 11.4 g/dL — ABNORMAL LOW (ref 11.9–15.9)
Lymphocyte # (HT): 3.6 10 3/uL — ABNORMAL HIGH (ref 0.6–3.3)
Lymphocyte % (HT): 37 % (ref 15–45)
MCHC (HT): 33.7 g/dL (ref 31.1–34.5)
MCV (HT): 91 fL (ref 80–97)
Mean Corpuscular Hemoglobin (MCH) (HT): 30.6 pg (ref 26.6–30.6)
Mean Platelet Volume (HT): 8.7 fL — ABNORMAL LOW (ref 9.0–12.2)
Monocyte # (HT): 0.7 10 3/uL (ref 0.1–1.1)
Monocyte % (HT): 7 % (ref 0–15)
Neutrophil # (HT): 4.6 10 3/uL (ref 2.0–7.1)
Platelets (HT): 324 10 3/uL (ref 142–414)
RBC (HT): 3.72 10 6/uL — ABNORMAL LOW (ref 4.04–5.48)
RDW (HT): 14.3 % (ref 12.9–16.3)
Seg Neut % (HT): 47 % (ref 45–75)
WBC (HT): 9.8 10 3/uL (ref 4.8–10.4)

## 2020-02-10 ENCOUNTER — Ambulatory Visit: Payer: Medicare (Managed Care) | Admitting: Primary Care

## 2020-02-10 ENCOUNTER — Encounter: Payer: Self-pay | Admitting: Primary Care

## 2020-02-10 VITALS — BP 120/80 | HR 77 | Temp 97.9°F | Ht 59.5 in

## 2020-02-10 DIAGNOSIS — M5126 Other intervertebral disc displacement, lumbar region: Secondary | ICD-10-CM

## 2020-02-10 DIAGNOSIS — J4520 Mild intermittent asthma, uncomplicated: Secondary | ICD-10-CM

## 2020-02-10 DIAGNOSIS — G4700 Insomnia, unspecified: Secondary | ICD-10-CM

## 2020-02-10 MED ORDER — OXYCODONE HCL 10 MG PO TABS *I*
10.0000 mg | ORAL_TABLET | ORAL | 0 refills | Status: DC | PRN
Start: 2020-02-10 — End: 2020-02-18

## 2020-02-10 MED ORDER — TRAZODONE HCL 50 MG PO TABS *I*
50.0000 mg | ORAL_TABLET | Freq: Every evening | ORAL | 1 refills | Status: DC
Start: 2020-02-10 — End: 2020-03-23

## 2020-02-10 MED ORDER — LEVALBUTEROL TARTRATE 45 MCG/ACT IN AERO *I*
2.0000 | INHALATION_SPRAY | RESPIRATORY_TRACT | 1 refills | Status: DC | PRN
Start: 2020-02-10 — End: 2020-07-17

## 2020-02-10 NOTE — Progress Notes (Signed)
Julia Burgess is a 59 y.o. female. (April 22, 1961)      No notes on file      Visit Vitals  BP 120/80 (BP Location: Left arm, Patient Position: Sitting, Cuff Size: large adult)   Pulse 77   Temp 36.6 C (97.9 F) (Temporal)   Ht 1.511 m (4' 11.5")   SpO2 97%   BMI 32.79 kg/m       CC: Concern regarding worsening lower back pain, insomnia and asthma    HPI: Julia Burgess is a 59 year old Caucasian lady who presented today with a concern regarding worsening pain in her lower back, struggling with her sleep and follow-up of asthma.    Per her report recently ( 01/30/20) she went to another emergency department due to increased pressure in her central chest area.  She says she stayed in the emergency department for a few hours her cardiac testings were negative therefore she was sent back to rehab where she was there due to her recent back surgery.  The emergency department visit she had labs, EKG and multiple other testings which were all satisfactory.  Her labs were positive for mild anemia. No leukocytosis. D-dimer was not elevated. Chemistries were unremarkable except for a CO2 of 17; normal anion gap. BNP was not elevated. Initial troponin was 3. 1 hour follow-up was unchanged at 3. SARS-COV 2 not detected. Influenza a and B not detected.   Transient nausea and vomiting in the ED (after the patient received sublingual nitroglycerin) resolved with IV Zofran.  Per the patient's report her shortness of breath and chest pain both resolved during ED visit therefore she was sent back home for rehab.  From rehab she was discharged home on 02/05/2020    Her past history is significant for multiple sclerosis, lumbar disc herniation and stenosis, status post lumbar fusion, lumbar decompression of L4 S1 with bilateral foraminotomies 12/2018, total hip replacement 04/2019, revision of lumbar decompression L4 S1 on 11/2019.  Patient was readmitted on 12/12/2019 ~12/16/2019 and found to have a nondisplaced sacral fracture.  She has been managed  conservatively for this.  Since then she has been taking oxycodone for pain control.  Said initially she was treated with Decadron.  Per her report currently she is using oxycodone 10 mg every four hourly as needed.  She says on average she has been using five or 6 tablets of oxycodone every day.  She denies any constipation or any other adverse effects from this medication.  So far she feels that it has been helpful to control her pain although at times her pain becomes quite unbearable.. Says currently she is running very low on her oxycodone supply.  Today she is rating her pain about 8/10 especially when she has to lie down on her bed or wheelchair for long period of time.  Says she is lying on this wheelchair for a while therefore her lower back is getting very uncomfortable for her.    Per her report she was diagnosed with multiple sclerosis nearly 8 years ago.  Per her report she has been following up with her neurologist regularly and currently she takes Copaxone every 3 weeks.  She also takes gabapentin 1200 mg 3 times daily for lower back pain and other discomfort related to MS.  She is also on Topamax 50 mg daily.    Per her report she also suffers mild asthma and occasionally takes Xopenex HFA.  Says currently she ran out of this and would like to have a refill  so she can use as needed basis.  Per her report she does not tolerate albuterol.  Per her report she uses Xopenex rarely at the most two or three times per month.      She is also concerned regarding insomnia.  Says since she came home she has not been sleeping very well.  Per her report the main issue is her lower back pain which disturbs her sleep and she just cannot be comfortable lying position.  She says at the most she is sleeping two or 3 hours every night and then she gets up again.  She says she tosses and turns and change her position then finally she will fall back to sleep again.  She would like to have some medication to help her with  her sleep.  She says during the rehab they were giving her Xanax which was helpful to calm her down and fall asleep easily at night but still still struggling to sleep well.  She was wondering if we can prescribe something for her sleep either Xanax or something else.      For hypothyroidism currently she is on levothyroxine 100 mcg daily.  She does follow-up with her endocrinologist.  Due to her history of blood disorder she is on Xarelto 20 mg daily.  Denies any episodes of bleeding or any other complication with Xarelto use.  Denies any pain or discomfort in her calves.  She also takes omeprazole 20 mg daily for her GERD-like symptoms.  Apart from the recent heartburn and central chest pain she does not have any other flareups.    For dyslipidemia currently she is Lipitor 20 mg daily.  So far she does not have any myalgia or arthralgia with Lipitor.  She says she has been tolerating this medication quite well.  Says she tries her best to stay on low-fat diet as much as    Past Medical History:      Anxiety and depression   situational    Clotting disorder    Fractures 1991 --bilateral wrists    Heart disease    Lumbar disc herniation    Lumbar stenosis, recurrent    MS (multiple sclerosis) ( Obesity (BMI 30-39.9)    Osteoarthritis --- right hip   Osteoporosis    Sacral fracture 12/12/2019    Thyroid disease     Past Surgical History:       ABDOMINAL SURGERY 1993    FOOT SURGERY 2010    HIP TOTAL ARTHROPLASTY (Right Hip 05/01/2019    LUMBAR DECOMPRESSION L4-S1 WITH BILATERAL FORAMENOTOMIES (N/A Spine Lumbar) 01/02/2019    LUMBAR FUSION N/A 11/26/2019     Family History      Cancer Mother    Diabetes Mother    Heart disease Mother    Osteoporosis Mother    Osteoporosis Maternal Grandmother    Rheumatologic disease Maternal Grandmother     Social History    Quit smoking in July 19th, 2021.  Per her report she used to smoke about 1-1 and half packs per day  Currently not smoking or drinking any  alcoholic drinks.  Says occasionally she used to do marijuana smoking but denies any using it for past many months.        ROS:  Denies any fever chills or sweats.  Denies any sore throat runny nose or increased sinus pressure.  Denies any chest pain or shortness of breath.  Denies any nausea, vomiting, constipation or diarrhea.  Denies any abdominal pain.  Denies any problem with urination.    Allergies   Allergen Reactions    Bee Venom Anaphylaxis    Ketorolac Other (See Comments)     Muscle spasms    Lactase Other (See Comments)     GI upset    Meperidine Nausea And Vomiting and Other (See Comments)    Prochlorperazine Other (See Comments)     myoclonic             Current Outpatient Medications   Medication Sig Dispense Refill    calcitonin, salmon, (MIACALCIN) 200 UNIT/ACT nasal spray Spray 1 spray into nostril daily      cholecalciferol (VITAMIN D) 1000 Units tablet Take 1,000 units by mouth daily      levalbuterol 45 MCG/ACT inhaler Inhale 2 puffs into the lungs every 4-6 hours as needed for Wheezing  Shake well before each use. 15 g 1    nicotine (NICODERM CQ) 14 MG/24HR patch Apply 1 patch onto the skin daily Remove & discard patch after 24 hours. 30 patch 2    silver sulfADIAZINE (SILVADENE) 1 % cream Apply topically daily to the following areas: Apply to burn area twice daily . Apply with gloves. 50 g 1    atorvastatin (LIPITOR) 20 MG tablet Take 20 mg by mouth nightly      topiramate (TOPAMAX) 25 MG tablet Take 50 mg by mouth daily       rivaroxaban (XARELTO) 20 MG tablet TAKE ONE TABLET BY MOUTH ONCE DAILY WITH  EVENING  MEAL      dronabinol (MARINOL) 2.5 MG capsule Take 2.5 mg by mouth 6 times daily       COPAXONE 40 MG/ML prefilled syringe Inject 40 mg into the skin three times a week       levothyroxine (SYNTHROID, LEVOTHROID) 100 MCG tablet Take 100 mcg by mouth daily (before breakfast)      gabapentin (NEURONTIN) 300 MG capsule Take 1-2 capsules QHS for 5-7 days.  If tolerated, may  increase to 1 capsule BID and 2 capsules at QHS. (Patient taking differently: Take 1,200 mg by mouth 3 times daily ) 120 2    omeprazole (PRILOSEC OTC) 20 MG tablet Take 20 mg by mouth as needed      oxyCODONE (ROXICODONE) 10 MG immediate release tablet Take 1 tablet (10 mg total) by mouth every 4 hours as needed  Max daily dose: 60 mg 50 tablet 0    traZODone (DESYREL) 50 MG tablet Take 1 tablet (50 mg total) by mouth nightly 30 tablet 1    generic DME Gel overlay mattress 1 each 0     No current facility-administered medications for this visit.         Past Medical History:   Diagnosis Date    Arthritis     Asthma     Chronic hypokalemia     Chronic low back pain     Depression     DVT (deep venous thrombosis)     Dysphagia     GERD (gastroesophageal reflux disease)     Heart disease     Hypercholesterolemia     Hypertension     Hypothyroidism     Multiple sclerosis     Multiple sclerosis, primary progressive     Osteoarthritis     Tobacco abuse          Past Surgical History:   Procedure Laterality Date    APPENDECTOMY      COLONOSCOPY  colostomy reversal  1993    HIP REPLACEMENT Right     perianal repair      SPINE SURGERY      TONSILLECTOMY AND ADENOIDECTOMY      VARICOSE VEIN SURGERY           Social History     Tobacco Use    Smoking status: Former Smoker     Packs/day: 1.00     Years: 30.00     Pack years: 30.00     Types: Cigarettes     Start date: 04/08/1980    Smokeless tobacco: Never Used    Tobacco comment: just recently quit   Substance Use Topics    Alcohol use: Yes     Alcohol/week: 4.0 - 6.0 standard drinks     Types: 4 - 6 Cans of beer per week    Drug use: Never         Physical Exam:   No acute distress.  Eyes: Clear conjunctiva.  No mucous discharge.  PERRLA  Ears: Normal pinnae.  Acuity  to conversational tones is good.    Both canals are clear.  Tympanic membranes appear gray and pearly.  Nose: Pink and moist mucosa.  No sinus tenderness.  Throat: Pink and  moist mucosa, no exudate or postnasal drip.  Neck:  Trachea midline.  No thyromegaly.  No visible or palpable masses noted.  No cervical or supraclavicular lymphadenopathy  Chest: Clear to auscultate bilaterally, with good air movement and no wheezes or rales.  No accessory muscle usage.  Respirations unlabored.  Saturation 97% on room air  Heart: S1 and S2 regular rate and rhythm.  No murmur or gallop  Abdomen: Soft, nontender and normoactive bowel sounds.  No overt hepatosplenomegaly  Lower extremities: No clubbing, cyanosis or edema.  No ulcerations or any increased warmth noted.  Musculoskeletal: She is lying 45 degrees on wheelchair.  She did stand up for a little while for exam.  Exam of her lower back showed a well-healed surgical site.  No effusion or erythema noted.  Moderate tenderness with palpation of her lumbar spine.  Height 5 out of 5 in both lower extremities.  Due to increased pain she was not advised to do any range of motion as her lower back is quite painful for her.    Assessment and Plan:        1. Lumbar disc herniation  She has extensive lumbar disc disease with recent surgeries and sacral fracture.  Advised her to continue oxycodone 10 mg every 4-6 hourly as needed basis due to moderate to severe pain in her lower back.  Continue gabapentin 1200 mg 3 times daily as prescribed by her neurologist.  Adverse effects of these medications were discussed with her and she verbalized understanding of it.    2. Insomnia, unspecified type  For insomnia we will start her on trazodone 50 mg daily.  Advised her to discontinue Xanax as it is not a good idea.  Combine Xanax with oxycodone use.  She was also advised to maintain a strict sleep hygiene and try to do some upper body physical exercise and lower body physical exercise as tolerated to tire herself for a good night sleep.      3. Mild intermittent asthma without complication  Currently her asthma is under excellent control and she only needs this  rarely.  Refill for Xopenex HFA was provided to her.  Advised her to use as needed basis.  Return in about 2 weeks (around 02/24/2020).

## 2020-02-18 ENCOUNTER — Other Ambulatory Visit: Payer: Self-pay | Admitting: Primary Care

## 2020-02-18 MED ORDER — OXYCODONE HCL 10 MG PO TABS *I*
10.0000 mg | ORAL_TABLET | ORAL | 0 refills | Status: DC | PRN
Start: 2020-02-18 — End: 2020-02-27

## 2020-02-18 NOTE — Telephone Encounter (Signed)
02/10/2020  Julia Burgess

## 2020-02-18 NOTE — Telephone Encounter (Signed)
Needs refill on oxycodone sent to [facility]

## 2020-02-19 ENCOUNTER — Other Ambulatory Visit: Payer: Self-pay

## 2020-02-19 LAB — VITAMIN D: Vitamin D 25 Hydroxy: 28 NG/ML — ABNORMAL LOW (ref 30–100)

## 2020-02-19 LAB — BASIC METABOLIC PANEL
Anion Gap: 15
CO2: 20 MMOL/L — ABNORMAL LOW (ref 22–29)
Calcium: 9.4 MG/DL (ref 8.6–10.2)
Chloride: 102 MMOL/L (ref 98–107)
Creatinine: 0.6 MG/DL (ref 0.5–0.9)
GFR,Black: 124 — ABNORMAL HIGH (ref 51–120)
GFR,Caucasian: 102 (ref 51–120)
Glucose: 91 MG/DL (ref 70–100)
Lab: <4 MG/DL — ABNORMAL LOW (ref 6–20)
Potassium: 3.8 MMOL/L (ref 3.5–5.1)
Sodium: 137 MMOL/L (ref 136–145)

## 2020-02-19 LAB — PTH, INTACT: Intact PTH: 39.8 PG/ML (ref 15.0–65.0)

## 2020-02-26 ENCOUNTER — Other Ambulatory Visit: Payer: Self-pay | Admitting: Primary Care

## 2020-02-26 NOTE — Telephone Encounter (Signed)
I-Stop completed, last filled: 02/18/20  Last office visit: 11/13/19 with you   Scheduled office visit: 03/02/2020 with anita

## 2020-02-26 NOTE — Telephone Encounter (Signed)
Julia Burgess, please talk to me regarding this patient

## 2020-02-26 NOTE — Telephone Encounter (Signed)
oxyCODONE (ROXICODONE) 10 MG immediate release tablet  Patient called and stated that she needs this medication refilled at [facility]

## 2020-02-27 ENCOUNTER — Encounter: Payer: Self-pay | Admitting: Gastroenterology

## 2020-02-27 ENCOUNTER — Telehealth: Payer: Self-pay | Admitting: Primary Care

## 2020-02-27 DIAGNOSIS — L8990 Pressure ulcer of unspecified site, unspecified stage: Secondary | ICD-10-CM

## 2020-02-27 MED ORDER — COLLAGENASE 250 UNIT/GM EX OINT *I*
TOPICAL_OINTMENT | Freq: Every day | CUTANEOUS | 2 refills | Status: DC
Start: 2020-02-27 — End: 2020-06-16

## 2020-02-27 MED ORDER — ALLEVYN GENTLE BORDER EX PADS
MEDICATED_PAD | CUTANEOUS | 5 refills | Status: DC
Start: 2020-02-27 — End: 2020-06-16

## 2020-02-27 MED ORDER — GAUZE PADS & DRESSINGS 2"X2" PADS *A*
MEDICATED_PAD | 2 refills | Status: DC
Start: 2020-02-27 — End: 2020-06-16

## 2020-02-27 MED ORDER — OXYCODONE HCL 10 MG PO TABS *I*
10.0000 mg | ORAL_TABLET | ORAL | 0 refills | Status: DC | PRN
Start: 2020-02-27 — End: 2020-03-03

## 2020-02-27 NOTE — Telephone Encounter (Signed)
Scripts are set to print please do this for [employer]

## 2020-02-27 NOTE — Telephone Encounter (Signed)
[facility] visiting nurse called and stated that they were previously doing her wound care to her buttock. Since she was in the hospital and in SNF this has turned into a no fault claim. They are unable to bill no fault for hr wound supplies. She looked into it and respiratory services in [place] [phone]. She said they need 2x2 and foam dressing 4x4 size sent to this company.  She stated they need Santyl sent to [facility] in [place].

## 2020-02-27 NOTE — Telephone Encounter (Signed)
Julia Burgess I know you dont know this patient but she called and has missed her last 3 doses . I dont know what Dr Dallie Piles wanted to see me about her, but she recently saw Rodena Piety for a hospital follow up if you want to see what is all wrong with her

## 2020-03-02 ENCOUNTER — Ambulatory Visit: Payer: Medicare (Managed Care) | Admitting: Primary Care

## 2020-03-02 DIAGNOSIS — G35 Multiple sclerosis: Secondary | ICD-10-CM

## 2020-03-02 DIAGNOSIS — R7309 Other abnormal glucose: Secondary | ICD-10-CM

## 2020-03-02 DIAGNOSIS — G4700 Insomnia, unspecified: Secondary | ICD-10-CM

## 2020-03-02 DIAGNOSIS — E039 Hypothyroidism, unspecified: Secondary | ICD-10-CM

## 2020-03-02 DIAGNOSIS — E785 Hyperlipidemia, unspecified: Secondary | ICD-10-CM

## 2020-03-02 DIAGNOSIS — J4520 Mild intermittent asthma, uncomplicated: Secondary | ICD-10-CM

## 2020-03-02 DIAGNOSIS — M5126 Other intervertebral disc displacement, lumbar region: Secondary | ICD-10-CM

## 2020-03-02 MED ORDER — DULOXETINE HCL 30 MG PO CPEP *I*
30.0000 mg | DELAYED_RELEASE_CAPSULE | Freq: Every day | ORAL | 0 refills | Status: DC
Start: 2020-03-02 — End: 2020-04-10

## 2020-03-02 NOTE — Progress Notes (Signed)
Julia Burgess is a 59 y.o. female. (07-06-1960)      No notes on file      There were no vitals taken for this visit.    CC: Follow-up of chronic lower back pain, insomnia, multiple sclerosis and asthma.      HPI: This is a video visit for Julia Burgess.  This visit was done during COVID-19 pandemic therefore no detailed exams or vital signs were obtained.      Julia Burgess is a 59 year old Caucasian lady who is getting evaluated today for her chronic lower back pain, insomnia, multiple sclerosis and mild asthma.  Her past history is also significant for dyslipidemia, hypothyroidism and hypertension.  Her latest blood glucose level was also slightly abnormal although she has not had a diagnosis of diabetes type 2 but her last hemoglobin A1c was 6.9 probably due to frequent use of prednisone.    Per her report her lumbar pain is manageable with oxycodone 10 mg every 4 hourly and gabapentin 1200 mg 3 times daily.  Says last week her pain was much worse and she was confined to her bed due to increased pain.  She thinks she might have another stress fracture in her lumbar area.  But likely the pain started to improve since yesterday and now she can get up with the help of walker she can manage to go to the bathroom.  But mostly she stays on her wheelchair or bed.  She has a history of multiple sclerosis, lumbar disc herniation and stenosis, status post lumbar fusion, lumbar decompression of L4 S1 with bilateral foraminotomies 12/2018, total hip replacement 04/2019, revision of lumbar decompression L4 S1 on 11/2019.  She was readmitted on 12/12/2019 ~12/16/2019 and found to have a nondisplaced sacral fracture.  She has been managed conservatively for this.  Since then she has been taking oxycodone for pain control.  Per her report today her pain level is about 7/10.  A few days ago it was more than 10 out of 10.    For her insomnia she was prescribed trazodone 50 mg nightly.  She feels that it is working very well for her and she has  been managing to sleep at least 4 to 5 hours every night with the help of trazodone.  She does feel restful in the morning.  So far she has not noticed any adverse effects with this medication.    She does admit that this chronic pain and her limitations with the multiple sclerosis is making her depressed.  She does feel quite frustrated especially when she has flareup of her aches and pains .  She feels quite debilitated and frustration with the limitations of her movements.  She was wondering if anything can be prescribed which will help.  Although she is quite reluctant to take any more medications than she has to.  As she is already taking multiple medications and not happy about it.    Her asthma is quite mild and she requires her Xopenex HFA only once or twice per month.  Generally she can manage without any medication.  Today she does not have any shortness of breath or chest congestion.  She denies any chest pain or wheezing.  So far she has been doing quite well with occasional use of Xopenex.    For dyslipidemia currently she is taking Lipitor 20 mg daily.  Denies any myalgia related to Lipitor use.  Per her report she has been trying her best to stay on low-carb and  low-fat diet.  Her lipid profile has not been checked for past many months.      For hypothyroidism currently she is levothyroxine 100 mcg daily.  Admit to feel chronically fatigued, which could be multifactorial.  Her last TSH, T4 and T3 was all within normal limits.  So far she has been tolerating this dose quite well.         ROS:  Fever chills or sweats.  Denies any sore throat runny nose or increased sinus pressure.  Denies any chest pain or shortness of breath.  Denies any nausea, vomiting, constipation or diarrhea.  Denies any abdominal pain.  Does admit to have chronic lower back pain which radiates towards his lower extremities.  Denies any problem with urination.    Allergies   Allergen Reactions    Bee Venom Anaphylaxis     Ketorolac Other (See Comments)     Muscle spasms    Lactase Other (See Comments)     GI upset    Meperidine Nausea And Vomiting and Other (See Comments)    Prochlorperazine Other (See Comments)     myoclonic             Current Outpatient Medications   Medication Sig Dispense Refill    DULoxetine (CYMBALTA) 30 MG DR capsule Take 1 capsule (30 mg total) by mouth daily 30 capsule 0    oxyCODONE (ROXICODONE) 10 mg immediate release tablet Take 1 tablet (10 mg total) by mouth every 4 hours as needed  Max daily dose: 60 mg 50 tablet 0    collagenase (SANTYL) ointment Apply topically daily  Apply to wound bed once daily and as needed 30 g 2    gauze pads 2"X2" Use once daily or as needed to cover the wound bed 30 each 2    Wound Dressings (ALLEVYN GENTLE BORDER) PADS Size 4x4 to cover wound area and change once daily and as needed 10 each 5    calcitonin, salmon, (MIACALCIN) 200 UNIT/ACT nasal spray Spray 1 spray into nostril daily      cholecalciferol (VITAMIN D) 1000 Units tablet Take 1,000 units by mouth daily      omeprazole (PRILOSEC OTC) 20 MG tablet Take 20 mg by mouth as needed      levalbuterol 45 MCG/ACT inhaler Inhale 2 puffs into the lungs every 4-6 hours as needed for Wheezing  Shake well before each use. 15 g 1    traZODone (DESYREL) 50 MG tablet Take 1 tablet (50 mg total) by mouth nightly 30 tablet 1    generic DME Gel overlay mattress 1 each 0    nicotine (NICODERM CQ) 14 MG/24HR patch Apply 1 patch onto the skin daily Remove & discard patch after 24 hours. 30 patch 2    silver sulfADIAZINE (SILVADENE) 1 % cream Apply topically daily to the following areas: Apply to burn area twice daily . Apply with gloves. 50 g 1    atorvastatin (LIPITOR) 20 MG tablet Take 20 mg by mouth nightly      topiramate (TOPAMAX) 25 MG tablet Take 50 mg by mouth daily       rivaroxaban (XARELTO) 20 MG tablet TAKE ONE TABLET BY MOUTH ONCE DAILY WITH  EVENING  MEAL      dronabinol (MARINOL) 2.5 MG capsule Take  2.5 mg by mouth 6 times daily       COPAXONE 40 MG/ML prefilled syringe Inject 40 mg into the skin three times a week  levothyroxine (SYNTHROID, LEVOTHROID) 100 MCG tablet Take 100 mcg by mouth daily (before breakfast)      gabapentin (NEURONTIN) 300 MG capsule Take 1-2 capsules QHS for 5-7 days.  If tolerated, may increase to 1 capsule BID and 2 capsules at QHS. (Patient taking differently: Take 1,200 mg by mouth 3 times daily ) 120 2     No current facility-administered medications for this visit.         Past Medical History:   Diagnosis Date    Arthritis     Asthma     Chronic hypokalemia     Chronic low back pain     Depression     DVT (deep venous thrombosis)     Dysphagia     GERD (gastroesophageal reflux disease)     Heart disease     Hypercholesterolemia     Hypertension     Hypothyroidism     Multiple sclerosis     Multiple sclerosis, primary progressive     Osteoarthritis     Tobacco abuse          Past Surgical History:   Procedure Laterality Date    APPENDECTOMY      COLONOSCOPY      colostomy reversal  1993    HIP REPLACEMENT Right     perianal repair      SPINE SURGERY      TONSILLECTOMY AND ADENOIDECTOMY      VARICOSE VEIN SURGERY           Social History     Tobacco Use    Smoking status: Former Smoker     Packs/day: 1.00     Years: 30.00     Pack years: 30.00     Types: Cigarettes     Start date: 04/08/1980    Smokeless tobacco: Never Used    Tobacco comment: just recently quit   Substance Use Topics    Alcohol use: Yes     Alcohol/week: 4.0 - 6.0 standard drinks     Types: 4 - 6 Cans of beer per week    Drug use: Never         Physical Exam:   Appears alert and oriented.  No acute distress noted.    Assessment and Plan:        1. Lumbar disc herniation  Currently she is suffering from moderate to severe low back pain related to lumbar disc herniation.  Advised her to continue oxycodone 10 mg every 4-6 hourly as needed basis.  She can continue gabapentin 1200 mg  3 times daily as prescribed by her neurologist.  Advised her to keep her appointment with the new neurologist next month    Her pain is moderate to severe in nature we will start her on Cymbalta 30 mg daily.  If she tolerates this dose and if it is helpful with pain control then it can be increased to 60 mg daily this might help with her mood as chronic pain and limitations with multiple sclerosis is giving her some frustration and depression  - Comprehensive metabolic panel; Future    2 insomnia, unspecified type:  Continue trazodone 50 mg daily as it is working very well for her.  Since she is going to start Cymbalta she was advised to monitor for any serotonin syndrome.  Advised her to report any concerning symptoms related to serotonin syndrome.  She verbalized understanding of it although both are quite low-dose therefore it is low risk for these issues.  3.  Multiple sclerosis:  She was advised to follow-up with her neurologist for multiple sclerosis continue all the medications as prescribed.  Currently she is on gabapentin, Topamax 50 mg daily and Copaxone 40 mg injections 3 times per week.    4. Mild intermittent asthma without complication  Continue Xopenex HFA 2 puffs every 4-6 hourly as needed for wheezing and shortness of breath    5. Dyslipidemia  Continue Lipitor 20 mg daily.  We will check a fasting lipid profile.  Advised her to stay on low-fat diet as much as possible  -Fasting lipid panel (Reflex to Direct  LDL if Triglycerides more than 400); Future    6. Hypothyroidism, unspecified type  Continue levothyroxine 100 mcg daily.  We will check her TSH, T4 and T3 in the next few days.   - TSH; Future  - T4, free; Future  - T3; Future    8. Abnormal glucose level  As she does have a history of abnormal glucose level we will check her hemoglobin A1c  - Hemoglobin A1c; Future       Return in about 4 weeks (around 03/30/2020).

## 2020-03-03 ENCOUNTER — Other Ambulatory Visit: Payer: Self-pay | Admitting: Primary Care

## 2020-03-03 NOTE — Telephone Encounter (Signed)
I-Stop completed, last filled: 02/27/20  Last office visit: yesterday tele home

## 2020-03-03 NOTE — Telephone Encounter (Signed)
Patient called needs refill on oxycodone

## 2020-03-04 MED ORDER — OXYCODONE HCL 10 MG PO TABS *I*
10.0000 mg | ORAL_TABLET | ORAL | 0 refills | Status: DC | PRN
Start: 2020-03-04 — End: 2020-03-13

## 2020-03-13 ENCOUNTER — Other Ambulatory Visit: Payer: Self-pay | Admitting: Primary Care

## 2020-03-13 MED ORDER — OXYCODONE HCL 10 MG PO TABS *I*
10.0000 mg | ORAL_TABLET | ORAL | 0 refills | Status: DC | PRN
Start: 2020-03-13 — End: 2020-03-23

## 2020-03-13 NOTE — Telephone Encounter (Signed)
oxyCODONE (ROXICODONE) 10 mg immediate release tablet  Patient called and stated she needs this medication refilled at [facility] in [facility]

## 2020-03-13 NOTE — Telephone Encounter (Signed)
I-Stop completed, last filled: 03/07/20 for 9 day supply   Last office visit: 03/02/20  Scheduled office visit: Visit date not found

## 2020-03-17 ENCOUNTER — Encounter: Payer: Self-pay | Admitting: Gastroenterology

## 2020-03-21 ENCOUNTER — Other Ambulatory Visit: Payer: Self-pay | Admitting: Primary Care

## 2020-03-23 ENCOUNTER — Other Ambulatory Visit: Payer: Self-pay | Admitting: Primary Care

## 2020-03-23 MED ORDER — OXYCODONE HCL 10 MG PO TABS *I*
10.0000 mg | ORAL_TABLET | ORAL | 0 refills | Status: DC | PRN
Start: 2020-03-23 — End: 2020-03-31

## 2020-03-23 NOTE — Telephone Encounter (Signed)
Rayssa was last seen: 03/02/20  Next scheduled office visit: Visit date not found

## 2020-03-23 NOTE — Telephone Encounter (Signed)
I-Stop completed, last filled: 03/14/20  Last office visit: 0/25/21 tele med

## 2020-03-24 ENCOUNTER — Other Ambulatory Visit: Payer: Self-pay

## 2020-03-24 ENCOUNTER — Encounter: Payer: Self-pay | Admitting: Gastroenterology

## 2020-03-24 LAB — COMPREHENSIVE METABOLIC PANEL
ALT: 11 U/L (ref 0–33)
ALT: 11 U/L (ref 0–33)
AST: 14 U/L (ref 0–32)
AST: 14 U/L (ref 0–32)
Albumin: 4.5 GM/DL (ref 3.5–5.2)
Albumin: 4.5 GM/DL (ref 3.5–5.2)
Alk Phos: 89 U/L (ref 35–104)
Alk Phos: 89 U/L (ref 35–104)
Anion Gap: 11
Anion Gap: 11
Bilirubin,Total: <0.2 MG/DL (ref 0.0–1.0)
Bilirubin,Total: <0.2 MG/DL (ref 0.0–1.0)
CO2: 24 MMOL/L (ref 22–29)
CO2: 24 MMOL/L (ref 22–29)
Calcium: 9.6 MG/DL (ref 8.6–10.2)
Calcium: 9.6 MG/DL (ref 8.6–10.2)
Chloride: 103 MMOL/L (ref 98–107)
Chloride: 103 MMOL/L (ref 98–107)
Creatinine: 0.7 MG/DL (ref 0.5–0.9)
Creatinine: 0.7 MG/DL (ref 0.5–0.9)
GFR,Black: 104 (ref 51–120)
GFR,Black: 104 (ref 51–120)
GFR,Caucasian: 86 (ref 51–120)
GFR,Caucasian: 86 (ref 51–120)
Glucose: 95 MG/DL (ref 70–100)
Glucose: 95 MG/DL (ref 70–100)
Lab: 16 MG/DL (ref 6–20)
Lab: 16 MG/DL (ref 6–20)
Potassium: 4.8 MMOL/L (ref 3.5–5.1)
Potassium: 4.8 MMOL/L (ref 3.5–5.1)
Sodium: 138 MMOL/L (ref 136–145)
Sodium: 138 MMOL/L (ref 136–145)
Total Protein: 6.5 GM/DL — ABNORMAL LOW (ref 6.6–8.7)
Total Protein: 6.5 GM/DL — ABNORMAL LOW (ref 6.6–8.7)

## 2020-03-24 LAB — LIPID PANEL
Chol/HDL Ratio: 3.34
Chol/HDL Ratio: 3.34
Cholesterol: 224 MG/DL — ABNORMAL HIGH (ref 107–200)
Cholesterol: 224 MG/DL — ABNORMAL HIGH (ref 107–200)
HDL: 67 MG/DL (ref 35–86)
HDL: 67 MG/DL (ref 35–86)
LDL Calculated: 128 MG/DL — ABNORMAL HIGH (ref 0–100)
LDL Calculated: 128 MG/DL — ABNORMAL HIGH (ref 0–100)
Triglycerides: 145 MG/DL (ref 35–150)
Triglycerides: 145 MG/DL (ref 35–150)

## 2020-03-24 LAB — T3: T3: 102.2 NG/DL (ref 52.0–130.0)

## 2020-03-24 LAB — T4, FREE
Free T4: 1.47 NG/DL (ref 0.93–1.70)
Free T4: 1.47 NG/DL (ref 0.93–1.70)

## 2020-03-24 LAB — TSH
TSH: 2 u[IU]/mL (ref 0.27–4.20)
TSH: 2 u[IU]/mL (ref 0.27–4.20)

## 2020-03-24 LAB — HEMOGLOBIN A1C: Hemoglobin A1C: 5.2 %

## 2020-03-27 ENCOUNTER — Other Ambulatory Visit: Payer: Self-pay | Admitting: Primary Care

## 2020-03-27 MED ORDER — DOXYCYCLINE HYCLATE 100 MG PO TABS *I*
100.0000 mg | ORAL_TABLET | Freq: Two times a day (BID) | ORAL | 0 refills | Status: AC
Start: 2020-03-27 — End: 2020-03-28

## 2020-03-27 MED ORDER — NALOXONE HCL 4 MG/0.1ML NA LIQD *I*
NASAL | 1 refills | Status: DC
Start: 2020-03-27 — End: 2022-08-17

## 2020-03-27 NOTE — Telephone Encounter (Signed)
oxyCODONE (ROXICODONE) 10 mg immediate release tablet  Patient called and stated she needs this medication refilled at [facility] in [place]. She is aware it is early

## 2020-03-27 NOTE — Telephone Encounter (Signed)
Spoke to [place].  Per her report she just made a request a little early she does not require to use this medication any any early.    Per her report she also thinks she got a tick bite in her stomach and quite worried about it.  And she is wondering whether she needs any preventative medication.  Advised her to take doxycycline 100 mg 2 tablets x 1 dose for 1 day as a preventative measure and we can do Lyme disease antibody test in a few weeks.

## 2020-03-30 ENCOUNTER — Other Ambulatory Visit: Payer: Self-pay | Admitting: Primary Care

## 2020-03-30 NOTE — Telephone Encounter (Signed)
She needs:    Oxycodone      Send to [facility]

## 2020-03-31 MED ORDER — OXYCODONE HCL 10 MG PO TABS *I*
10.0000 mg | ORAL_TABLET | Freq: Four times a day (QID) | ORAL | 0 refills | Status: DC | PRN
Start: 2020-03-31 — End: 2020-04-09

## 2020-03-31 NOTE — Telephone Encounter (Signed)
I-Stop completed, last filled: 03/23/20 for 5 day supply   Last office visit: 03/02/20  Scheduled office visit: Visit date not found

## 2020-03-31 NOTE — Telephone Encounter (Signed)
Patient is very angry that this has not been address.

## 2020-04-01 ENCOUNTER — Ambulatory Visit: Payer: Medicare (Managed Care) | Attending: Primary Care

## 2020-04-01 DIAGNOSIS — Z79899 Other long term (current) drug therapy: Secondary | ICD-10-CM | POA: Insufficient documentation

## 2020-04-01 DIAGNOSIS — F1190 Opioid use, unspecified, uncomplicated: Secondary | ICD-10-CM | POA: Insufficient documentation

## 2020-04-01 LAB — PAIN CLINIC PROFILE
Amphetamine,UR: NEGATIVE
Benzodiazepinen,UR: NEGATIVE
Cocaine/Metab,UR: NEGATIVE
Opiates,UR: NEGATIVE
Oxycodone/Oxymorphone,UR: POSITIVE
THC Metabolite,UR: POSITIVE

## 2020-04-06 DIAGNOSIS — Z79899 Other long term (current) drug therapy: Secondary | ICD-10-CM | POA: Insufficient documentation

## 2020-04-06 LAB — CONFIRM THC METABOLITE, URINE: Confirm THC Metab: POSITIVE

## 2020-04-06 LAB — CONFIRM OPIATES: Confirm Opiates: POSITIVE

## 2020-04-07 ENCOUNTER — Telehealth: Payer: Self-pay

## 2020-04-07 LAB — THC SEMI-QUANT, URINE
Creatinine,UR: 43 mg/dL (ref 20–300)
Semi-Quant THC,UR: 15 ng/mL
THC/Creat Ratio: 35 ng/mg

## 2020-04-07 NOTE — Telephone Encounter (Signed)
Dr. Patria Mane office called because Dr. Enis Gash was looking to speak with your in regards to [place]. Their phone number is [phone]. The phones are off between 12-1 but if you call during that time and choose option 1 it will take you through and they can get Dr. Enis Gash for you.

## 2020-04-07 NOTE — Progress Notes (Signed)
Patient did urine sample for drug screen and signed agreement

## 2020-04-08 LAB — CALCIUM, URINE, TIMED
Calcium,Ur: 10.1 MG/DL
Collection Period: 24 HOURS
Total Volume,UR: 1500 mL
Urine Calcium, Quant: 152 MG/24HR (ref 100–321)

## 2020-04-09 ENCOUNTER — Other Ambulatory Visit: Payer: Self-pay | Admitting: Primary Care

## 2020-04-09 NOTE — Telephone Encounter (Signed)
Spoke to Dr. Enis Gash regarding narcotic pain medication.  We will refer her to a pain clinic for further evaluation of her pain.

## 2020-04-09 NOTE — Telephone Encounter (Signed)
I-Stop completed, last filled: 03/31/20 for 13 day supply   Last office visit: 03/02/20  Scheduled office visit: 04/20/2020

## 2020-04-09 NOTE — Telephone Encounter (Signed)
She needs:    Oxycodone    Send to [facility]

## 2020-04-10 MED ORDER — OXYCODONE HCL 10 MG PO TABS *I*
10.0000 mg | ORAL_TABLET | Freq: Four times a day (QID) | ORAL | 0 refills | Status: DC | PRN
Start: 2020-04-10 — End: 2020-04-20

## 2020-04-10 NOTE — Telephone Encounter (Signed)
Patient called about this medication. Can we please send

## 2020-04-20 ENCOUNTER — Ambulatory Visit: Payer: Medicare (Managed Care) | Admitting: Primary Care

## 2020-04-20 ENCOUNTER — Encounter: Payer: Self-pay | Admitting: Primary Care

## 2020-04-20 VITALS — BP 146/92 | HR 77 | Temp 98.2°F | Ht 59.5 in | Wt 160.4 lb

## 2020-04-20 DIAGNOSIS — S3210XA Unspecified fracture of sacrum, initial encounter for closed fracture: Secondary | ICD-10-CM

## 2020-04-20 DIAGNOSIS — M5126 Other intervertebral disc displacement, lumbar region: Secondary | ICD-10-CM

## 2020-04-20 DIAGNOSIS — G35 Multiple sclerosis: Secondary | ICD-10-CM

## 2020-04-20 DIAGNOSIS — E785 Hyperlipidemia, unspecified: Secondary | ICD-10-CM

## 2020-04-20 DIAGNOSIS — E039 Hypothyroidism, unspecified: Secondary | ICD-10-CM

## 2020-04-20 MED ORDER — OXYCODONE HCL 10 MG PO TABS *I*
10.0000 mg | ORAL_TABLET | ORAL | 0 refills | Status: DC | PRN
Start: 2020-04-20 — End: 2020-05-15

## 2020-04-20 NOTE — Progress Notes (Signed)
Julia Burgess is a 59 y.o. female (Apr 21, 1961)    Nurses note:   Follow-up (general check up )      CC: This patient presents today to discuss her ongoing medical issues and to discuss her pain medication usage.    HPI: Patient presents today to discuss her ongoing medical issues and to address her use of pain medications.  Julia Burgess is a 59 year old female patient with a very complex past medical history consisting of a long history of MS, hip osteoarthritis and most recently diagnosed with osteoporosis, spinal stenosis in the lumbar region.  Her history is quite remarkable for multiple lumbar surgeries including revision of a lumbar decompression of L4-S1 with instrumented fusion of L4-S1.  Unfortunately, postoperatively she developed sacral insufficiency fractures.  She is currently undergoing rehabilitation and believes that she is making satisfactory progress.  Despite this, she continue to complain of chronic low back pain with pain radiating to both lower extremities.  She has noticed some right foot weakness in terms of ability to pick her foot up but she denies any recent falls.  She is now using a rolling walker for ambulatory purposes.  She continues with physical therapy.  I reviewed her most recent DEXA scan that showed significant osteoporosis in her hips.  She is a candidate for Prolia therapy and is being followed by endocrinology for this.  She has been on chronic pain medication for a number of years and up until recently was using oxycodone 10 mg every 4 hours as needed, recently, the previous prescribing prescriber asked her to decrease her oxycodone to every 6 hours and noted a positive urine drug screen for THC.  The patient denies any use of marijuana type products and blames the use of dronabinol and Copaxone as the cause of her elevated THC.  She also takes as mentioned the Copaxone, gabapentin 900 mg 3 times daily, dronabinol regularly along with Xarelto and has been taking Advil for breakthrough  pain.  She complains of one episode of nosebleeds.  She also complains of some occasional epigastric type of pains.  She denies headaches, chest pains, shortness of breath, nausea, vomiting, change in bowel or bladder function.  She has had no fevers, chills or night sweats.    ROS  As per HPI    Allergies   Allergen Reactions    Bee Venom Anaphylaxis    Ketorolac Other (See Comments)     Muscle spasms    Lactase Other (See Comments)     GI upset    Meperidine Nausea And Vomiting and Other (See Comments)    Prochlorperazine Other (See Comments)     myoclonic         Current Outpatient Medications   Medication    oxyCODONE (ROXICODONE) 10 mg immediate release tablet    naloxone (NARCAN) 4 mg/0.1 mL nasal spray    traZODone (DESYREL) 50 mg tablet    collagenase (SANTYL) ointment    gauze pads 2"X2"    Wound Dressings (ALLEVYN GENTLE BORDER) PADS    calcitonin, salmon, (MIACALCIN) 200 UNIT/ACT nasal spray    omeprazole (PRILOSEC OTC) 20 MG tablet    levalbuterol 45 MCG/ACT inhaler    generic DME    nicotine (NICODERM CQ) 14 MG/24HR patch    silver sulfADIAZINE (SILVADENE) 1 % cream    atorvastatin (LIPITOR) 20 MG tablet    topiramate (TOPAMAX) 25 MG tablet    rivaroxaban (XARELTO) 20 MG tablet    dronabinol (MARINOL) 2.5 MG capsule  COPAXONE 40 MG/ML prefilled syringe    levothyroxine (SYNTHROID, LEVOTHROID) 100 MCG tablet    gabapentin (NEURONTIN) 300 MG capsule     No current facility-administered medications for this visit.       Past Medical History:   Diagnosis Date    Arthritis     Asthma     Chronic hypokalemia     Chronic low back pain     Depression     DVT (deep venous thrombosis)     Dysphagia     GERD (gastroesophageal reflux disease)     Heart disease     Hypercholesterolemia     Hypertension     Hypothyroidism     Multiple sclerosis     Multiple sclerosis, primary progressive     Osteoarthritis     Tobacco abuse      Social History     Socioeconomic History     Marital status: Married     Spouse name: Not on file    Number of children: Not on file    Years of education: Not on file    Highest education level: Not on file   Occupational History    Not on file   Tobacco Use    Smoking status: Former Smoker     Packs/day: 1.00     Years: 30.00     Pack years: 30.00     Types: Cigarettes     Start date: 04/08/1980    Smokeless tobacco: Never Used    Tobacco comment: just recently quit   Substance and Sexual Activity    Alcohol use: Yes     Alcohol/week: 4.0 - 6.0 standard drinks     Types: 4 - 6 Cans of beer per week    Drug use: Never    Sexual activity: Not Currently     Partners: Male     Birth control/protection: Post-menopausal   Social History Narrative    Not on file         Vitals:    04/20/20 1440   BP: (!) 146/92   Pulse: 77   Temp: 36.8 C (98.2 F)   Weight: 72.8 kg (160 lb 6.4 oz)   Height: 1.511 m (4' 11.5")     Body mass index is 31.85 kg/m.    Physical Exam   On examination, she appears to be in some noted distress with frequent sitting up from the chair because of low back and sacral pain.  Her blood pressure is elevated 146/92 with a pulse of 77 and she is afebrile.  Her BMI is 31%.  Her head and neck examination shows no visible oral lesion or thyromegaly.  Her chest shows scattered rhonchi but no wheezes or rales and her heart exam is regular in rate and rhythm.  Her abdomen is soft without any tenderness, rebound or guarding and extremity shows no clubbing, cyanosis or edema.  Her musculoskeletal exam is very limited because of pain along the entire lumbar and sacral spine.  She is using a rolling walker for ambulation and has difficulty standing upright because of pain.    Assessment/Plan    1. Lumbar disc herniation  The patient presents with a longstanding history of failed low back surgery and now has developed sacral insufficiency fractures.  She has noted osteoporosis of the hips and suspected osteoporosis elsewhere.  She appears to be in  some considerable discomfort and therefore I will increase her oxycodone to every 4 hours as needed.  A  new prescription was given on today's visit.  She is agreeable to having frequent urinary drug screen.  Is on calcium and vitamin D and will most likely be starting Prolia.  This is being addressed by endocrinology.    2. Sacral fracture  Sacral fracture as result of her osteoporosis and other vascular insufficiencies.  She knows it will probably take over 3 months for this to heal.  Repeat imaging studies will be done as requested by orthopedics.    3. Multiple sclerosis  She has MS that appears to be at least stable with use of Copaxone.  Recent imaging studies of her brain were relatively unremarkable.  She will continue with this medication.    4. Dyslipidemia  She has dyslipidemia.  Takes atorvastatin on a regular basis.  Will request lipid profile.    5. Hypothyroidism, unspecified type  She has stable hypothyroidism.  She will continue levothyroxine 100 mcg on a daily basis.  I will recheck thyroid function studies.      No follow-ups on file.

## 2020-05-15 ENCOUNTER — Other Ambulatory Visit: Payer: Self-pay | Admitting: Primary Care

## 2020-05-15 NOTE — Telephone Encounter (Signed)
She needs:    Oxycodone      Send to [facility]

## 2020-05-15 NOTE — Telephone Encounter (Signed)
I-Stop completed, last filled: 04/20/20  Last office visit: 04/20/20  Scheduled office visit: 06/10/2020

## 2020-05-18 MED ORDER — OXYCODONE HCL 10 MG PO TABS *I*
10.0000 mg | ORAL_TABLET | ORAL | 0 refills | Status: DC | PRN
Start: 2020-05-18 — End: 2020-06-18

## 2020-06-03 ENCOUNTER — Other Ambulatory Visit: Payer: Self-pay | Admitting: Primary Care

## 2020-06-03 MED ORDER — LEVOTHYROXINE SODIUM 100 MCG PO TABS *I*
100.0000 ug | ORAL_TABLET | Freq: Every day | ORAL | 1 refills | Status: DC
Start: 2020-06-03 — End: 2020-12-28

## 2020-06-03 NOTE — Telephone Encounter (Signed)
04/20/2020

## 2020-06-03 NOTE — Telephone Encounter (Signed)
Needs script for levothyroxine 100 mcg one every day to [facility].

## 2020-06-10 ENCOUNTER — Ambulatory Visit: Payer: Medicare (Managed Care) | Admitting: Primary Care

## 2020-06-16 ENCOUNTER — Encounter: Payer: Self-pay | Admitting: Primary Care

## 2020-06-16 ENCOUNTER — Ambulatory Visit: Payer: Medicare (Managed Care) | Admitting: Primary Care

## 2020-06-16 VITALS — BP 156/86 | HR 85 | Temp 98.1°F | Ht 59.5 in | Wt 162.2 lb

## 2020-06-16 DIAGNOSIS — G35 Multiple sclerosis: Secondary | ICD-10-CM

## 2020-06-16 DIAGNOSIS — E039 Hypothyroidism, unspecified: Secondary | ICD-10-CM

## 2020-06-16 DIAGNOSIS — E785 Hyperlipidemia, unspecified: Secondary | ICD-10-CM

## 2020-06-16 DIAGNOSIS — S3210XA Unspecified fracture of sacrum, initial encounter for closed fracture: Secondary | ICD-10-CM

## 2020-06-16 DIAGNOSIS — G4700 Insomnia, unspecified: Secondary | ICD-10-CM

## 2020-06-16 NOTE — Progress Notes (Signed)
Julia Burgess is a 60 y.o. female ([birth year])    Nurses note:   Follow-up (2 month check up )      CC: Julia Burgess presents today for a follow-up evaluation.  Has multiple complaints including that of nonhealing lumbar and sacral fractures, chronic pain and limitation in her mobility as result of her back surgery.    HPI: This is a 59 year old female patient with a noted history of hypertension, dyslipidemia, hypothyroidism, osteoporosis, questionable history of asthma, excessive history of smoking, anxiety, multiple sclerosis, chronic pain syndrome, diabetes mellitus who is maintained on chronic oral pain medication.  Noted to have lumbar disc herniation and stenosis and is status post lumbar fusion, lumbar decompression of L4-S1 with bilateral foraminotomies, total hip replacement, revision of the lumbar decompression of L4-S1 and recently discovered to have a nondisplaced sacral fracture.  Recently seen by endocrinology who is contemplating starting the patient on some new bone building medication.  She continues with calcium and vitamin D supplements.  She presents today to discuss current issues and complains of difficulty getting comfortable.  She uses a rolling walker for ambulation purposes.  She denies any headaches, chest pains, shortness of breath, nausea, vomiting, change in bowel or bladder function.  She complains of difficulty sleeping and is always in chronic pain in her back and elsewhere.  On multiple medications including Copaxone, gabapentin 1200 mg 3 times daily, dronabinol, Xarelto, atorvastatin, omeprazole, Miacalcin nasal spray, levothyroxine and oxycodone on a regular basis.  Followed by endocrinology who recommended a repeat DEXA scan at the end of the year.  Reviewed her blood test result that showed an elevated total and LDL cholesterol with a relatively normal metabolic profile.  ROS  As per HPI      Lab results: 03/24/20  0930   Cholesterol 224*   224*   HDL 67   67   LDL Calculated 128*    128*   Triglycerides 145   145   Chol/HDL Ratio 3.34   3.34     No components found with this basename: NHLDC        Lab results: 03/24/20  0930   Sodium 138   138   Potassium 4.8   4.8   Chloride 103   103   CO2 24   24   UN 16   16   Creatinine 0.7   0.7   GFR,Caucasian 86   86   GFR,Black 104   104   Glucose 95   95   Calcium 9.6   9.6   Total Protein 6.5*   6.5*   Albumin 4.5   4.5   ALT 11   11   AST 14   14   Alk Phos 89   89   Bilirubin,Total <0.2   <0.2     Lab Results   Component Value Date    HA1C 5.2 03/24/2020       Allergies   Allergen Reactions    Bee Venom Anaphylaxis    Ketorolac Other (See Comments)     Muscle spasms    Lactase Other (See Comments)     GI upset    Meperidine Nausea And Vomiting and Other (See Comments)    Prochlorperazine Other (See Comments)     myoclonic         Current Outpatient Medications   Medication    levothyroxine (SYNTHROID, LEVOTHROID) 100 mcg tablet    oxyCODONE (ROXICODONE) 10 mg immediate release tablet    naloxone (  NARCAN) 4 mg/0.1 mL nasal spray    traZODone (DESYREL) 50 mg tablet    calcitonin, salmon, (MIACALCIN) 200 UNIT/ACT nasal spray    omeprazole (PRILOSEC OTC) 20 MG tablet    levalbuterol 45 MCG/ACT inhaler    generic DME    nicotine (NICODERM CQ) 14 MG/24HR patch    atorvastatin (LIPITOR) 20 MG tablet    rivaroxaban (XARELTO) 20 MG tablet    dronabinol (MARINOL) 2.5 MG capsule    COPAXONE 40 MG/ML prefilled syringe    gabapentin (NEURONTIN) 300 MG capsule     No current facility-administered medications for this visit.       Past Medical History:   Diagnosis Date    Arthritis     Asthma     Chronic hypokalemia     Chronic low back pain     Depression     DVT (deep venous thrombosis)     Dysphagia     GERD (gastroesophageal reflux disease)     Heart disease     Hypercholesterolemia     Hypertension     Hypothyroidism     Multiple sclerosis     Multiple sclerosis, primary progressive     Osteoarthritis     Tobacco abuse       Social History     Socioeconomic History    Marital status: Married     Spouse name: Not on file    Number of children: Not on file    Years of education: Not on file    Highest education level: Not on file   Occupational History    Not on file   Tobacco Use    Smoking status: Former Smoker     Packs/day: 1.00     Years: 30.00     Pack years: 30.00     Types: Cigarettes     Start date: 04/08/1980    Smokeless tobacco: Never Used    Tobacco comment: just recently quit   Substance and Sexual Activity    Alcohol use: Yes     Alcohol/week: 4.0 - 6.0 standard drinks     Types: 4 - 6 Cans of beer per week    Drug use: Never    Sexual activity: Not Currently     Partners: Male     Birth control/protection: Post-menopausal   Social History Narrative    Not on file         Vitals:    06/16/20 1541   BP: 156/86   Pulse: 85   Temp: 36.7 C (98.1 F)   Weight: 73.6 kg (162 lb 3.2 oz)   Height: 1.511 m (4' 11.5")     Body mass index is 32.21 kg/m.    Physical Exam   On examination, the patient's blood pressure is 156/86 with a pulse of 85 and she is afebrile.  She has difficulty getting up from a sitting position and is unable to climb onto the exam table.  Her head and neck exam shows no oral lesion or thyromegaly.  She has no bruits.  Chest shows decreased breath sounds no wheezes, rhonchi or rales and heart exam is regular in rate and rhythm.  Her abdomen is soft without tenderness, rebound or guarding and extremity shows no edema.  She is wearing a TLSO brace.    Assessment/Plan    1. Multiple sclerosis  She has a long history of MS and takes Copaxone on a regular basis.  As per neurology she will continue with this  medication.  No apparent side effects.  - TSH; Future  - Comprehensive metabolic panel; Future  - CBC; Future    2. Dyslipidemia  She has dyslipidemia in the setting of diabetes mellitus and was encouraged to have a repeat lipid profile.  She was advised to continue atorvastatin.  - Lipid Panel  (Reflex to Direct  LDL if Triglycerides more than 400); Future  - Comprehensive metabolic panel; Future  - Urinalysis with reflex to culture; Future  - Hemoglobin A1c; Future  - Vitamin D; Future    3. Sacral fracture  She has sacral fracture after undergoing multiple previous lumbar surgeries.  She remains incapacitated as result of ongoing back pains.  She will continue current oral pain medications including dronabinol and oxycodone.  - Comprehensive metabolic panel; Future  - CBC; Future  - Urinalysis with reflex to culture; Future  - Hemoglobin A1c; Future    4. Hypothyroidism, unspecified type  She has stable hypothyroidism.  Was instructed to continue levothyroxine 100 mcg daily.  I will check a repeat thyroid function studies prior to her next visit.  - TSH; Future      No follow-ups on file.

## 2020-06-17 ENCOUNTER — Other Ambulatory Visit: Payer: Self-pay | Admitting: Primary Care

## 2020-06-17 NOTE — Telephone Encounter (Signed)
I-Stop completed, last filled: 05/20/20  Last office visit: yesterday

## 2020-06-18 ENCOUNTER — Other Ambulatory Visit: Payer: Self-pay | Admitting: Primary Care

## 2020-06-18 NOTE — Telephone Encounter (Signed)
She needs:    Oxycodone      Send to [facility]

## 2020-06-18 NOTE — Telephone Encounter (Signed)
06/16/20

## 2020-06-19 MED ORDER — OXYCODONE HCL 10 MG PO TABS *I*
10.0000 mg | ORAL_TABLET | ORAL | 0 refills | Status: DC | PRN
Start: 2020-06-19 — End: 2020-07-14

## 2020-06-19 NOTE — Telephone Encounter (Signed)
Patient called and getting very nervous about this medication refilled before she runs out on the weekend

## 2020-06-22 ENCOUNTER — Ambulatory Visit: Payer: Medicare (Managed Care) | Admitting: Primary Care

## 2020-06-27 ENCOUNTER — Other Ambulatory Visit: Payer: Self-pay | Admitting: Primary Care

## 2020-06-29 MED ORDER — TRAZODONE HCL 50 MG PO TABS *I*
50.0000 mg | ORAL_TABLET | Freq: Every evening | ORAL | 1 refills | Status: DC
Start: 2020-06-29 — End: 2022-08-17

## 2020-06-29 MED ORDER — RIVAROXABAN 20 MG PO TABS *I*
20.0000 mg | ORAL_TABLET | Freq: Every day | ORAL | 1 refills | Status: DC
Start: 2020-06-29 — End: 2020-12-28

## 2020-06-29 NOTE — Telephone Encounter (Signed)
Julia Burgess was last seen: 06/16/20  Next scheduled office visit: Visit date not found

## 2020-07-14 ENCOUNTER — Other Ambulatory Visit: Payer: Self-pay | Admitting: Primary Care

## 2020-07-14 NOTE — Telephone Encounter (Signed)
06/16/2020

## 2020-07-15 MED ORDER — OXYCODONE HCL 10 MG PO TABS *I*
10.0000 mg | ORAL_TABLET | ORAL | 0 refills | Status: DC | PRN
Start: 2020-07-15 — End: 2020-08-16

## 2020-07-17 ENCOUNTER — Other Ambulatory Visit: Payer: Self-pay | Admitting: Primary Care

## 2020-07-17 MED ORDER — LEVALBUTEROL TARTRATE 45 MCG/ACT IN AERO *I*
2.0000 | INHALATION_SPRAY | RESPIRATORY_TRACT | 1 refills | Status: DC | PRN
Start: 2020-07-17 — End: 2020-08-16

## 2020-07-17 NOTE — Telephone Encounter (Signed)
06/16/2020

## 2020-08-16 ENCOUNTER — Other Ambulatory Visit: Payer: Self-pay | Admitting: Primary Care

## 2020-08-17 NOTE — Telephone Encounter (Signed)
I-Stop completed, last filled: 07/19/20  Last office visit: 06/16/20

## 2020-08-18 ENCOUNTER — Other Ambulatory Visit: Payer: Self-pay

## 2020-08-18 NOTE — Telephone Encounter (Signed)
Patient called for a refill on her Oxycodone to be sent to the [facility] in [place].

## 2020-08-18 NOTE — Telephone Encounter (Signed)
It is already pended to Dr. Dallie Piles. Closing this encounter out.

## 2020-08-19 MED ORDER — LEVALBUTEROL TARTRATE 45 MCG/ACT IN AERO *I*
2.0000 | INHALATION_SPRAY | RESPIRATORY_TRACT | 1 refills | Status: DC | PRN
Start: 2020-08-19 — End: 2020-12-18

## 2020-08-19 MED ORDER — OXYCODONE HCL 10 MG PO TABS *I*
10.0000 mg | ORAL_TABLET | ORAL | 0 refills | Status: DC | PRN
Start: 2020-08-19 — End: 2020-09-14

## 2020-08-19 NOTE — Telephone Encounter (Signed)
Pharmacy called as Kamela called them about her Oxycodone. The pharmacist stated that she is out and will be missing a couple days. Patient needs it filled.

## 2020-08-24 ENCOUNTER — Telehealth: Payer: Self-pay | Admitting: Primary Care

## 2020-08-24 NOTE — Telephone Encounter (Signed)
She has a uti.  Pressure and burning.  Can she get something sent in for this?  She has an infusion this afternoon and she usually does not feel well the next day. You have no openings for the rest of the week.

## 2020-09-14 ENCOUNTER — Other Ambulatory Visit: Payer: Self-pay | Admitting: Primary Care

## 2020-09-14 NOTE — Telephone Encounter (Signed)
I-Stop completed, last filled: 08/20/20  Last office visit:06/16/20

## 2020-09-15 MED ORDER — OXYCODONE HCL 10 MG PO TABS *I*
10.0000 mg | ORAL_TABLET | ORAL | 0 refills | Status: DC | PRN
Start: 2020-09-15 — End: 2020-10-16

## 2020-10-16 ENCOUNTER — Other Ambulatory Visit: Payer: Self-pay

## 2020-10-16 ENCOUNTER — Telehealth: Payer: Self-pay

## 2020-10-16 MED ORDER — OXYCODONE HCL 10 MG PO TABS *I*
10.0000 mg | ORAL_TABLET | ORAL | 0 refills | Status: DC | PRN
Start: 2020-10-16 — End: 2020-11-13

## 2020-10-16 NOTE — Telephone Encounter (Signed)
Mailed lab reqs to patient.

## 2020-10-16 NOTE — Telephone Encounter (Signed)
Patient called for a refill on her Oxycodone to be sent to the [facility] in [place].

## 2020-10-16 NOTE — Telephone Encounter (Signed)
06/16/2020

## 2020-10-30 ENCOUNTER — Encounter: Payer: Self-pay | Admitting: Gastroenterology

## 2020-11-03 ENCOUNTER — Other Ambulatory Visit: Payer: Self-pay | Admitting: Primary Care

## 2020-11-03 LAB — COMPREHENSIVE METABOLIC PANEL
ALT: 15 U/L (ref 0–33)
AST: 16 U/L (ref 0–32)
Albumin: 4.5 GM/DL (ref 3.5–5.2)
Alk Phos: 98 U/L (ref 35–104)
Anion Gap: 11
Bilirubin,Total: 0.4 MG/DL (ref 0.0–1.0)
CO2: 28 MMOL/L (ref 22–29)
Calcium: 9.9 MG/DL (ref 8.6–10.2)
Chloride: 103 MMOL/L (ref 98–107)
Creatinine: 0.7 MG/DL (ref 0.5–0.9)
GFR,Black: 104 (ref 51–120)
GFR,Caucasian: 86 (ref 51–120)
Glucose: 114 MG/DL — ABNORMAL HIGH (ref 70–100)
Lab: 10 MG/DL (ref 6–20)
Potassium: 4.7 MMOL/L (ref 3.5–5.1)
Sodium: 142 MMOL/L (ref 136–145)
Total Protein: 6.9 GM/DL (ref 6.6–8.7)

## 2020-11-03 LAB — LIPID PANEL
Chol/HDL Ratio: 3.33
Cholesterol: 213 MG/DL — ABNORMAL HIGH (ref 107–200)
HDL: 64 MG/DL (ref 35–86)
LDL Calculated: 115 MG/DL — ABNORMAL HIGH (ref 0–100)
Triglycerides: 170 MG/DL — ABNORMAL HIGH (ref 35–150)

## 2020-11-03 LAB — VITAMIN D: Vitamin D 25 Hydroxy: 35 NG/ML (ref 30–100)

## 2020-11-03 LAB — CBC
Hematocrit: 42.9 % (ref 37.5–47.7)
Hemoglobin: 13.4 g/dL (ref 12.1–15.8)
MCH: 28.3 pg (ref 26.4–33.6)
MCHC: 31.2 g/dL — ABNORMAL LOW (ref 31.9–37.3)
MCV: 91 fL (ref 80–93)
Mean Platelet Volume: 9.1 fL — ABNORMAL LOW (ref 10.1–10.4)
Nucl RBC # K/uL: 0 10*3/uL (ref 0.0–0.012)
Nucl RBC %: 0 % (ref 0.0–0.2)
Platelets: 495 10*3 — ABNORMAL HIGH (ref 144–366)
RBC Distribution Width-SD: 59.7 fL — ABNORMAL HIGH (ref 41.0–45.3)
RBC: 4.73 X10 6 (ref 4.16–5.34)
RDW: 18 % — ABNORMAL HIGH (ref 12.8–14.2)
WBC: 6.5 10*3 (ref 4.3–11.0)

## 2020-11-03 LAB — HEMOGLOBIN A1C: Hemoglobin A1C: 6 %

## 2020-11-03 LAB — TSH: TSH: 0.23 u[IU]/mL — ABNORMAL LOW (ref 0.27–4.20)

## 2020-11-04 ENCOUNTER — Ambulatory Visit: Payer: Medicare (Managed Care) | Admitting: Primary Care

## 2020-11-04 ENCOUNTER — Encounter: Payer: Self-pay | Admitting: Primary Care

## 2020-11-04 VITALS — BP 136/82 | HR 70 | Temp 97.7°F | Ht 59.5 in | Wt 165.5 lb

## 2020-11-04 DIAGNOSIS — J449 Chronic obstructive pulmonary disease, unspecified: Secondary | ICD-10-CM

## 2020-11-04 DIAGNOSIS — E039 Hypothyroidism, unspecified: Secondary | ICD-10-CM

## 2020-11-04 DIAGNOSIS — Z0000 Encounter for general adult medical examination without abnormal findings: Secondary | ICD-10-CM

## 2020-11-04 DIAGNOSIS — M48061 Spinal stenosis, lumbar region without neurogenic claudication: Secondary | ICD-10-CM | POA: Insufficient documentation

## 2020-11-04 DIAGNOSIS — Z1211 Encounter for screening for malignant neoplasm of colon: Secondary | ICD-10-CM

## 2020-11-04 DIAGNOSIS — I8390 Asymptomatic varicose veins of unspecified lower extremity: Secondary | ICD-10-CM

## 2020-11-04 DIAGNOSIS — G35 Multiple sclerosis: Secondary | ICD-10-CM

## 2020-11-04 MED ORDER — UMECLIDINIUM-VILANTEROL 62.5-25 MCG/INH IN AEPB *I*
1.0000 | INHALATION_SPRAY | Freq: Every day | RESPIRATORY_TRACT | 5 refills | Status: DC
Start: 2020-11-04 — End: 2020-11-11

## 2020-11-04 NOTE — Patient Instructions (Signed)
Thank you for completing your Follow-up (4 month check up, believes she is retaining fluid) and Initial Annual Medicare visit   with [place] today.     The purpose of this visits was to:     Screen for disease   Assess risk of future medical problems   Help develop a healthy lifestyle   Update vaccines   Get to know your doctor in case of an illness    Patient Care Team:  Maxine Glenn, MD as PCP - General (Primary Care)  Janalyn Harder, MD (Neurology)     Medicare 5 Year Plan    The following items were identified as areas of concern during your screening today:  Smoking- This is a risk factor for many kids of Cancer, Heart Attack, Stroke, Kidney Problems, Eye Problems, Asthma, COPD, and can cause an overall decrease in energy.   BMI greater than 25 - This is a risk for Heart Attack, Stroke, High Blood Pressure, Diabetes, High Cholesterol and other complications.       The Health Maintenance table below identifies screening tests and immunizations recommended by your health care team:  Health Maintenance: These screening recommendations are based on USPSTF, [employer], and [place] state guidelines   Topic Date Due    COVID-19 Vaccine (1) Never done    HIV Screening  Never done    Cervical Cancer Screening   Never done    Colon Cancer Screening  Never done    Shingles Vaccine (1 of 2) Never done    Lung Cancer Screening  Never done    Flu Shot (Season Ended) 01/07/2021    DEPRESSION SCREEN YEARLY  11/04/2021    Breast Cancer Screening  02/26/2022    Osteoporosis Screening  03/26/2025    Hepatitis C Screening  Completed     In addition, goals and orders placed to address these recommendations are listed in the "Today's Visit" section.    We wish you the best of health and look forward to seeing you again next year for your Annual Medicare Wellness Visit.     If you have any health care concerns before then, please do not hesitate to contact us.
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
## 2020-11-11 ENCOUNTER — Other Ambulatory Visit: Payer: Self-pay | Admitting: Primary Care

## 2020-11-11 MED ORDER — UMECLIDINIUM-VILANTEROL 62.5-25 MCG/INH IN AEPB *I*
1.0000 | INHALATION_SPRAY | Freq: Every day | RESPIRATORY_TRACT | 5 refills | Status: DC
Start: 2020-11-11 — End: 2022-08-17

## 2020-11-11 NOTE — Telephone Encounter (Signed)
Pharmacy is requesting the Anoro inhaler be resent.  Script all set to send

## 2020-11-13 ENCOUNTER — Other Ambulatory Visit: Payer: Self-pay | Admitting: Primary Care

## 2020-11-13 MED ORDER — OXYCODONE HCL 10 MG PO TABS *I*
10.0000 mg | ORAL_TABLET | ORAL | 0 refills | Status: DC | PRN
Start: 2020-11-13 — End: 2020-12-07

## 2020-11-13 NOTE — Telephone Encounter (Signed)
I-Stop completed, last filled: 10/19/20  Last office visit: 11/04/20  Scheduled office visit: 03/03/21    Dr. Helaine Chess patient

## 2020-11-16 ENCOUNTER — Telehealth: Payer: Self-pay | Admitting: Primary Care

## 2020-11-16 LAB — UNMAPPED LAB RESULTS
Basophil # (HT): 0.1 10 3/uL (ref 0.0–0.1)
Basophil % (HT): 1 % (ref 0–3)
Eosinophil # (HT): 0.1 10 3/uL (ref 0.0–0.8)
Eosinophil % (HT): 2 % (ref 0–5)
Hematocrit (HT): 38 % (ref 38–48)
Hemoglobin (HGB) (HT): 13.5 g/dL (ref 11.9–15.9)
Lymphocyte # (HT): 2.5 10 3/uL (ref 0.6–3.3)
Lymphocyte % (HT): 39 % (ref 15–45)
MCHC (HT): 35.2 g/dL — ABNORMAL HIGH (ref 31.1–34.5)
MCV (HT): 83 fL (ref 80–97)
Mean Corpuscular Hemoglobin (MCH) (HT): 29.1 pg (ref 26.6–30.6)
Mean Platelet Volume (HT): 8.7 fL — ABNORMAL LOW (ref 9.0–12.2)
Monocyte # (HT): 0.5 10 3/uL (ref 0.1–1.1)
Monocyte % (HT): 7 % (ref 0–15)
Neutrophil # (HT): 3.3 10 3/uL (ref 2.0–7.1)
Platelets (HT): 389 10 3/uL (ref 142–414)
RBC (HT): 4.64 10 6/uL (ref 4.04–5.48)
RDW (HT): 17 % — ABNORMAL HIGH (ref 12.9–16.3)
Seg Neut % (HT): 51 % (ref 45–75)
WBC (HT): 6.5 10 3/uL (ref 4.8–10.4)

## 2020-11-16 NOTE — Telephone Encounter (Signed)
Her left hip is doing the same thing as the right hip did.  Can you order an xray for her.  She has left hip pain and can't use her walker to walk.

## 2020-11-19 ENCOUNTER — Encounter: Payer: Self-pay | Admitting: Gastroenterology

## 2020-11-22 LAB — URINALYSIS WITH REFLEX TO MICROSCOPIC

## 2020-12-04 ENCOUNTER — Telehealth: Payer: Self-pay | Admitting: Primary Care

## 2020-12-04 NOTE — Telephone Encounter (Signed)
She would like to speak with Dr. Dallie Piles regarding her oxycodone.  She tripped 3 weeks ago and went to urgent care.  She had xrays done and they sent her to her orthopedic doctor.  She had a fractured pelvis and femur and issues with her hip.  She goes to see a Psychologist, sport and exercise on Mon, 12/07/20. She said it is too hard for her to come in and wants to know if you can increase the medication.      Please call her at [phone]

## 2020-12-04 NOTE — Telephone Encounter (Signed)
She is already taking high doses of oral pain medication and I do not suggest any further increase in pain medicines.  She should follow-up with orthopedics recommendations.

## 2020-12-07 ENCOUNTER — Other Ambulatory Visit: Payer: Self-pay | Admitting: Primary Care

## 2020-12-07 ENCOUNTER — Encounter: Payer: Self-pay | Admitting: Gastroenterology

## 2020-12-07 ENCOUNTER — Telehealth: Payer: Self-pay | Admitting: Primary Care

## 2020-12-07 MED ORDER — HYDROCODONE-ACETAMINOPHEN 10-325 MG PO TABS *I*
1.0000 | ORAL_TABLET | Freq: Three times a day (TID) | ORAL | 0 refills | Status: DC | PRN
Start: 2020-12-07 — End: 2021-01-07

## 2020-12-07 MED ORDER — OXYCODONE HCL 10 MG PO TABS *I*
10.0000 mg | ORAL_TABLET | ORAL | 0 refills | Status: DC | PRN
Start: 2020-12-07 — End: 2021-01-14

## 2020-12-07 NOTE — Telephone Encounter (Signed)
I have refilled her pain medication.

## 2020-12-07 NOTE — Telephone Encounter (Signed)
Julia Burgess called regarding increased pain in her pelvic area needed to be sent fracture and avascular necrosis.  She says she was using her oxycodone a little bit more than she was prescribed as her pain was unbearable.  Therefore she ran out of her oxycodone a little bit earlier than her usual time.  She is requesting some more oxycodone as her pain is getting unbearable.  Dr. Dallie Piles was notified regarding her increased pain.  He said he will take care of this.Marland Kitchen

## 2020-12-07 NOTE — Telephone Encounter (Signed)
Spoke with Joyia

## 2020-12-18 ENCOUNTER — Other Ambulatory Visit: Payer: Self-pay | Admitting: Primary Care

## 2020-12-18 NOTE — Telephone Encounter (Signed)
11/04/2020

## 2020-12-26 ENCOUNTER — Other Ambulatory Visit: Payer: Self-pay | Admitting: Primary Care

## 2020-12-28 NOTE — Telephone Encounter (Signed)
Julia Burgess was last seen: 11/04/20  Next scheduled office visit: 03/03/2021     Dr Hughes Better patient

## 2020-12-29 ENCOUNTER — Encounter: Payer: Self-pay | Admitting: Gastroenterology

## 2021-01-05 ENCOUNTER — Ambulatory Visit: Payer: Medicare (Managed Care) | Admitting: Primary Care

## 2021-01-05 ENCOUNTER — Encounter: Payer: Self-pay | Admitting: Primary Care

## 2021-01-05 VITALS — BP 144/88 | HR 79 | Temp 97.6°F | Ht 59.5 in | Wt 169.6 lb

## 2021-01-05 DIAGNOSIS — M169 Osteoarthritis of hip, unspecified: Secondary | ICD-10-CM

## 2021-01-05 DIAGNOSIS — Z01818 Encounter for other preprocedural examination: Secondary | ICD-10-CM

## 2021-01-05 DIAGNOSIS — M810 Age-related osteoporosis without current pathological fracture: Secondary | ICD-10-CM

## 2021-01-05 MED ORDER — ENOXAPARIN SODIUM 30 MG/0.3ML IJ SOSY *I*
30.0000 mg | PREFILLED_SYRINGE | Freq: Two times a day (BID) | INTRAMUSCULAR | 0 refills | Status: DC
Start: 2021-01-05 — End: 2021-03-03

## 2021-01-05 NOTE — Progress Notes (Signed)
Julia Burgess is a 60 y.o. female (06-14-1960)    Nurses note:   Pre-op Exam (left hip replacement with Dr. Ardine Eng on 01/25/21)      CC: This patient presents today for preop evaluation and observation of her upcoming left hip replacement surgery scheduled for 01/25/2021.    HPI: Julia Burgess is a 60 year old female patient with a past medical history significant for hypertension, MS, dyslipidemia, hypothyroidism, gastroesophageal reflux disease, lumbar stenosis and a clotting disorder/DVT currently maintained on chronic Xarelto.  In addition, she takes atorvastatin, albuterol inhaler, levothyroxine,  vitamin D3, vitamin C, vitamin B12, multivitamin with minerals, trazodone, omeprazole, Copaxone, gabapentin, oxycodone and dronabinol.  Her past medical history is significant for lumbar spine stenosis status post lumbar surgery.  She is status post right hip replacement and osteoporosis.  She suffered a recent stress fracture of the sacrum.  Unfortunately, over the last several months she has developed gradually worsening left hip pain worsened by walking or weightbearing activities.  She has difficulty going up and down stairs and normally uses a rolling walker for ambulatory assistance.  She has failed physical therapy and has had incomplete benefit with regular OTC medicines.  She relies heavily on oxycodone on a regular basis to help alleviate some of her ongoing discomfort.  She presented to the local orthopedic surgeon who recommended a total hip arthroplasty because of suspected osteonecrosis of the left hip.  Previously, treated with high doses of oral steroids.  I did review a recent DEXA scan that showed significant osteoporosis.  The patient has yet to start a bone building medicine but takes calcium and vitamin D on a regular basis.  Her known drug allergies heart rate ketorolac, meperidine, lactase and prochlorperazine.  She denies headaches, chest pains, nausea, vomiting, diarrhea or constipation, fevers, chills or  night sweats.  She admits to shortness of breath worsened with exertion.  She has chronic left hip pain.    ROS  As per HPI    Allergies   Allergen Reactions    Bee Venom Anaphylaxis    Ketorolac Other (See Comments)     Muscle spasms    Lactase Other (See Comments)     GI upset    Meperidine Nausea And Vomiting and Other (See Comments)    Prochlorperazine Other (See Comments)     myoclonic         Current Outpatient Medications   Medication    atorvastatin (LIPITOR) 40 mg tablet    albuterol HFA (PROVENTIL, VENTOLIN, PROAIR HFA) 108 (90 Base) MCG/ACT inhaler    psyllium (METAMUCIL) 28.3 % POWD powder    levothyroxine (SYNTHROID, LEVOTHROID) 100 mcg tablet    XARELTO 20 MG tablet    LEVALBUTEROL 45 MCG/ACT inhaler    HYDROcodone-acetaminophen (NORCO) 10-325 mg per tablet    umeclidinium-vilanterol (ANORO ELLIPTA) 62.5-25 MCG/INH inhaler    cholecalciferol (VITAMIN D3) 1,000 unit tablet    ascorbic acid (VITAMIN C) 100 MG tablet    Cyanocobalamin (VITAMIN B12) 1000 MCG TBCR    Multiple Vitamins-Minerals (CENTRUM SILVER 50+WOMEN) TABS    traZODone (DESYREL) 50 mg tablet    omeprazole (PRILOSEC OTC) 20 MG tablet    dronabinol (MARINOL) 2.5 MG capsule    COPAXONE 40 MG/ML prefilled syringe    gabapentin (NEURONTIN) 300 MG capsule    oxyCODONE (ROXICODONE) 10 mg immediate release tablet    naloxone (NARCAN) 4 mg/0.1 mL nasal spray     No current facility-administered medications for this visit.  Past Medical History:   Diagnosis Date    Arthritis     Asthma     Chronic hypokalemia     Chronic low back pain     Depression     DVT (deep venous thrombosis)     Dysphagia     GERD (gastroesophageal reflux disease)     Heart disease     Hypercholesterolemia     Hypertension     Hypothyroidism     Multiple sclerosis     Multiple sclerosis, primary progressive     Osteoarthritis     Tobacco abuse      Social History     Socioeconomic History    Marital status: Married     Spouse  name: Not on file    Number of children: Not on file    Years of education: Not on file    Highest education level: Not on file   Occupational History    Not on file   Tobacco Use    Smoking status: Current Every Day Smoker     Packs/day: 1.00     Years: 30.00     Pack years: 30.00     Types: Cigarettes     Start date: 04/08/1980    Smokeless tobacco: Never Used   Substance and Sexual Activity    Alcohol use: Yes     Alcohol/week: 6.0 standard drinks     Types: 6 Standard drinks or equivalent per week    Drug use: Never    Sexual activity: Not Currently     Partners: Male     Birth control/protection: Post-menopausal   Social History Narrative    Not on file         Vitals:    01/05/21 1114   BP: 144/88   Pulse: 79   Temp: 36.4 C (97.6 F)   Weight: 76.9 kg (169 lb 9.6 oz)   Height: 1.511 m (4' 11.5")     Body mass index is 33.68 kg/m.    Physical Exam   On examination, blood pressure is 144/88 with a pulse of 79 and the patient is afebrile with a BMI of 33%.  Head and neck examination shows no visible oral lesion, thyromegaly or carotid bruit.  She has no cervical adenopathy.  Her chest shows scattered rhonchi but no wheezes or rales and her heart exam is regular in rate and rhythm without any murmurs, rubs or gallops.  Her abdomen is soft without tenderness, rebound or guarding and extremities reveal no clubbing, cyanosis or edema.  She has significant difficulty getting up from a sitting position and walking onto the exam table.  She relies on a rolling walker.  She has almost no range of motion of the left hip because of pain.    Assessment/Plan    1. Pre-op examination  Julia Burgess presents today for preop evaluation.  EKG relatively normal, requested to have preop testing including CBC, metabolic profile and bleeding indices.  The patient will be given medical clearance pending the results of her laboratory review.  On multiple medicines including Xarelto and was advised to discontinue Xarelto 5 days  prior to surgery and to bridge herself with Lovenox.  Prescription for Lovenox 30 mg subcu twice daily x5 days was called into the patient's pharmacy.  She was advised to continue her current medicines up until the day prior to surgery.  - EKG 12 lead  - CBC; Future  - Basic metabolic panel; Future  - Protime-INR; Future  -  APTT; Future    2. OA (osteoarthritis) of hip  She has significant osteoarthritis of the left hip and probably has osteonecrosis of the left hip as result of chronic steroid usage.  She will be given medical clearance for anticipated hip arthroplasty after reviewing her requested serologies.    3. Osteoporosis  She has osteoporosis as result of steroid therapy.  I have encouraged the patient to continue calcium, vitamin D and to start a bone building medicine which will be recommended by her orthopedic surgeon.      No follow-ups on file.

## 2021-01-06 ENCOUNTER — Other Ambulatory Visit: Payer: Self-pay | Admitting: Primary Care

## 2021-01-06 NOTE — Telephone Encounter (Signed)
I-Stop completed, last filled: 81/22 for 14 day supply   Last office visit: 01/05/21  Scheduled office visit: 03/03/2021

## 2021-01-06 NOTE — Telephone Encounter (Signed)
The patient was given a temporary supply of hydrocodone because she exhausted her use of oxycodone.  She should continue oxycodone.  No further hydrocodone will be prescribed at this time

## 2021-01-07 MED ORDER — HYDROCODONE-ACETAMINOPHEN 10-325 MG PO TABS *I*
1.0000 | ORAL_TABLET | Freq: Three times a day (TID) | ORAL | 0 refills | Status: DC | PRN
Start: 2021-01-07 — End: 2021-02-06

## 2021-01-07 NOTE — Telephone Encounter (Signed)
Spoke to [facility] per her report she used her last oxycodone in the morning today.  She does not have any more oxycodone or hydrocodone.  She says she cannot fill her oxycodone until 12 September but she can fill her hydrocodone.  She says she is in lot of pain and needs something to control her pain.  A refill for hydrocodone 10-325 mg 1 tablet every 8 hourly was given to her.  A total of 40 tablets were dispensed.

## 2021-01-07 NOTE — Addendum Note (Signed)
Addended by: Dina Rich on: 01/07/2021 05:12 PM     Modules accepted: Orders

## 2021-01-07 NOTE — Telephone Encounter (Signed)
Patient called about this request, can a nurse please call her to discuss this. I did not see this message until we got off the phone

## 2021-01-07 NOTE — Telephone Encounter (Addendum)
Please ask her how many oxycodone and hydrocodone she has been using per day. She had large supply of these medications.  Dr. Dallie Piles refused her hydrocodone request yesterday

## 2021-01-07 NOTE — Telephone Encounter (Signed)
Called and spoke to Julia Burgess, she is out of the oxycodone again because she was taking more than she was supposed to due to her pelvic fracture.  She was asking for the Hydrocodone to cover her.  She has surgery on 01/25/21.  I told her Dr. Dallie Piles was out of the office till next week.  Not sure if you want to fill the Hydrocodone or not

## 2021-01-07 NOTE — Telephone Encounter (Signed)
She said she was taking between 4-6 a day of the oxycodone but now that she is out 4 of the hydrocodone

## 2021-01-14 ENCOUNTER — Other Ambulatory Visit: Payer: Self-pay | Admitting: Primary Care

## 2021-01-14 NOTE — Telephone Encounter (Signed)
I-Stop completed, last filled: 12/18/20  Last office visit: 01/05/21  Scheduled office visit: 03/03/2021

## 2021-01-15 MED ORDER — OXYCODONE HCL 10 MG PO TABS *I*
10.0000 mg | ORAL_TABLET | ORAL | 0 refills | Status: DC | PRN
Start: 2021-01-15 — End: 2021-02-12

## 2021-01-18 LAB — UNMAPPED LAB RESULTS
ABO RH Blood Type (HT): A POS
Antibody Screen (HT): NEGATIVE
Hematocrit (HT): 41 % (ref 38–48)
Hemoglobin (HGB) (HT): 14 g/dL (ref 11.9–15.9)
MCHC (HT): 33.9 g/dL (ref 31.1–34.5)
MCV (HT): 90 fL (ref 80–97)
Mean Corpuscular Hemoglobin (MCH) (HT): 30.6 pg (ref 26.6–30.6)
Mean Platelet Volume (HT): 8.5 fL — ABNORMAL LOW (ref 9.0–12.2)
Platelets (HT): 361 10 3/uL (ref 142–414)
RBC (HT): 4.57 10 6/uL (ref 4.04–5.48)
RDW (HT): 17.7 % — ABNORMAL HIGH (ref 12.9–16.3)
WBC (HT): 13.2 10 3/uL — ABNORMAL HIGH (ref 4.8–10.4)

## 2021-01-20 ENCOUNTER — Other Ambulatory Visit: Payer: Self-pay | Admitting: Primary Care

## 2021-01-20 NOTE — Telephone Encounter (Signed)
01/05/2021

## 2021-01-25 ENCOUNTER — Other Ambulatory Visit: Payer: Self-pay | Admitting: Gastroenterology

## 2021-01-26 LAB — UNMAPPED LAB RESULTS
Hematocrit (HT): 34 % — ABNORMAL LOW (ref 38–48)
Hemoglobin (HGB) (HT): 11.3 g/dL — ABNORMAL LOW (ref 11.9–15.9)
MCHC (HT): 33.1 g/dL (ref 31.1–34.5)
MCV (HT): 94 fL (ref 80–97)
Mean Corpuscular Hemoglobin (MCH) (HT): 31 pg — ABNORMAL HIGH (ref 26.6–30.6)
Mean Platelet Volume (HT): 9 fL (ref 9.0–12.2)
Platelets (HT): 342 10 3/uL (ref 142–414)
RBC (HT): 3.64 10 6/uL — ABNORMAL LOW (ref 4.04–5.48)
RDW (HT): 17.4 % — ABNORMAL HIGH (ref 12.9–16.3)
WBC (HT): 12.5 10 3/uL — ABNORMAL HIGH (ref 4.8–10.4)

## 2021-01-29 DIAGNOSIS — K5900 Constipation, unspecified: Secondary | ICD-10-CM | POA: Insufficient documentation

## 2021-01-29 DIAGNOSIS — F339 Major depressive disorder, recurrent, unspecified: Secondary | ICD-10-CM

## 2021-01-29 DIAGNOSIS — J45909 Unspecified asthma, uncomplicated: Secondary | ICD-10-CM | POA: Insufficient documentation

## 2021-01-29 DIAGNOSIS — F419 Anxiety disorder, unspecified: Secondary | ICD-10-CM | POA: Insufficient documentation

## 2021-01-29 DIAGNOSIS — M1990 Unspecified osteoarthritis, unspecified site: Secondary | ICD-10-CM | POA: Insufficient documentation

## 2021-01-29 HISTORY — DX: Anxiety disorder, unspecified: F41.9

## 2021-01-29 HISTORY — DX: Major depressive disorder, recurrent, unspecified: F33.9

## 2021-01-29 HISTORY — DX: Unspecified asthma, uncomplicated: J45.909

## 2021-01-29 HISTORY — DX: Constipation, unspecified: K59.00

## 2021-02-05 ENCOUNTER — Other Ambulatory Visit: Payer: Self-pay | Admitting: Primary Care

## 2021-02-05 NOTE — Telephone Encounter (Signed)
01/05/2021 last here

## 2021-02-05 NOTE — Telephone Encounter (Signed)
She is going to be discharged from rehab and will need a script for hyrdocodone.  Send to [facility]

## 2021-02-05 NOTE — Telephone Encounter (Signed)
This patient is taking oxycodone  4 X day. She should not be taking hydrocodone with it

## 2021-02-06 ENCOUNTER — Telehealth: Payer: Self-pay | Admitting: Primary Care

## 2021-02-06 MED ORDER — HYDROCODONE-ACETAMINOPHEN 10-325 MG PO TABS *I*
1.0000 | ORAL_TABLET | Freq: Three times a day (TID) | ORAL | 0 refills | Status: DC | PRN
Start: 2021-02-06 — End: 2021-02-19

## 2021-02-06 NOTE — Telephone Encounter (Signed)
Please see previous note.

## 2021-02-06 NOTE — Telephone Encounter (Signed)
This patient is a 60 year old female who recently had a total hip who recently had a total knee replacement.  She is being discharged today from rehabilitation, states that she has no pain medication.  Norco 10/325 #40 is sent to her pharmacy.

## 2021-02-09 ENCOUNTER — Encounter: Payer: Self-pay | Admitting: Gastroenterology

## 2021-02-12 ENCOUNTER — Telehealth: Payer: Self-pay | Admitting: Primary Care

## 2021-02-12 ENCOUNTER — Ambulatory Visit: Payer: Medicare (Managed Care) | Admitting: Primary Care

## 2021-02-12 ENCOUNTER — Other Ambulatory Visit: Payer: Self-pay | Admitting: Primary Care

## 2021-02-12 MED ORDER — OXYCODONE HCL 15 MG PO TABS *I*
15.0000 mg | ORAL_TABLET | ORAL | 0 refills | Status: AC | PRN
Start: 2021-02-12 — End: 2021-02-17

## 2021-02-12 NOTE — Telephone Encounter (Signed)
I do not know this lady.  So rather than give her a month, I gave her 5 days.  If she is due on Monday that we will give her till next Friday.  Hopefully by then you can ask Dr. Dallie Piles what he like to do.    PS I did call in 15mg 

## 2021-02-12 NOTE — Telephone Encounter (Signed)
Patient calls requesting Norco 10/325, she is status post TKR.  Please see chart for refill

## 2021-02-12 NOTE — Telephone Encounter (Signed)
I called the pharmacy and left a message for them to hold the script till it's due to be filled on Monday 10/10

## 2021-02-12 NOTE — Telephone Encounter (Signed)
I-Stop completed, last filled: 5mg  tabs given 02/02/21 and 10mg  tabs given 02/10/21.    Last office visit: 01/05/21  Scheduled office visit: 03/03/2021     01/29/21- discharged with oxyCODONE 10 mg Oral immediate release tablet 10-15mg  every 4 hours as needed for pain MDD 9 tabs    10/4/2- She f/u with Dr. Ardine Eng he gave her a refill on the 10-15mg  every 4-6 hours as needed    Per Dr. Ardine Eng visit:    Her pain ever since surgery has been significant and this is likely complicated by her previous narcotic use. I discussed this with her at length today. Her radiographs are appear unremarkable. She continues to complain of burning pain which is likely nerve related along the dorsal leg and foot. She has had similar symptoms on the contralateral side since her surgery so I do have some suspicion that this could be coming from her low back especially given her previous sacral fracture.

## 2021-02-12 NOTE — Telephone Encounter (Signed)
Pharmacy called and stated that the patient was prescribed   Oxycodone 10mg  by Dr Ardine Eng 30tabs  They would like to know if we would still like to fill her script for the 15mg  or wait until she has completed the other script?

## 2021-02-12 NOTE — Telephone Encounter (Signed)
She needs:    Oxycodone 15 mg- her surgeon increase to 15 mg from 10 mg.  She would loke to go one more time with the 15 mg if possible.  If not she will go with the 10 mg and deal with the pain. She is not due until Monday.

## 2021-02-19 ENCOUNTER — Telehealth: Payer: Self-pay | Admitting: Primary Care

## 2021-02-19 MED ORDER — HYDROCODONE-ACETAMINOPHEN 10-325 MG PO TABS *I*
1.0000 | ORAL_TABLET | Freq: Three times a day (TID) | ORAL | 0 refills | Status: DC | PRN
Start: 2021-02-19 — End: 2021-02-24

## 2021-02-19 NOTE — Telephone Encounter (Signed)
I-Stop completed, last filled: 02/06/21 for 13 day supply  Last office visit: 01/05/21  Scheduled office visit: 03/03/2021

## 2021-02-19 NOTE — Telephone Encounter (Signed)
HYDROcodone-acetaminophen (NORCO) 10-325 mg per tablet  Patient called and stated this medication needs to be refilled at [facility].

## 2021-02-22 ENCOUNTER — Telehealth: Payer: Self-pay | Admitting: Primary Care

## 2021-02-22 NOTE — Telephone Encounter (Signed)
She wanted a script for oxycodone and she get hydrocodone which does not work as well.  Can she get oxycodone sent in?  She would like to speak with you.  Call her if you can at [phone]

## 2021-02-23 ENCOUNTER — Other Ambulatory Visit: Payer: Self-pay | Admitting: Primary Care

## 2021-02-23 NOTE — Telephone Encounter (Signed)
Julia Burgess was last seen: 01/05/21  Next scheduled office visit: 03/03/2021

## 2021-02-24 ENCOUNTER — Other Ambulatory Visit: Payer: Self-pay | Admitting: Primary Care

## 2021-02-24 MED ORDER — OXYCODONE HCL 10 MG PO TABS *I*
10.0000 mg | ORAL_TABLET | Freq: Four times a day (QID) | ORAL | 0 refills | Status: DC | PRN
Start: 2021-02-24 — End: 2021-03-02

## 2021-02-24 NOTE — Telephone Encounter (Signed)
Patient called and stated she is not going to be well after 2:00 today if this does not get sent in. Can you please address

## 2021-03-01 ENCOUNTER — Other Ambulatory Visit: Payer: Self-pay | Admitting: Primary Care

## 2021-03-01 ENCOUNTER — Telehealth: Payer: Self-pay | Admitting: Primary Care

## 2021-03-01 NOTE — Telephone Encounter (Signed)
I'm not sure which medication she should be taking. It looks like she should still have Norco left. She is coming in to see you 03/03/2021

## 2021-03-01 NOTE — Telephone Encounter (Signed)
Patient called and stated this medication needs to be refilled again because it is only for 5 days

## 2021-03-01 NOTE — Telephone Encounter (Signed)
Oxycodone 10mg   [facility] in [place]

## 2021-03-02 ENCOUNTER — Other Ambulatory Visit: Payer: Self-pay | Admitting: Primary Care

## 2021-03-02 ENCOUNTER — Telehealth: Payer: Self-pay | Admitting: Primary Care

## 2021-03-02 MED ORDER — OXYCODONE HCL 10 MG PO TABS *I*
10.0000 mg | ORAL_TABLET | Freq: Four times a day (QID) | ORAL | 0 refills | Status: DC | PRN
Start: 2021-03-02 — End: 2021-03-03

## 2021-03-02 NOTE — Telephone Encounter (Signed)
The pharmacy got your script, the medication is on backorder.    The patient also called [facility] and had them send a script in.

## 2021-03-02 NOTE — Telephone Encounter (Signed)
Oxycodone 10mg   Pharmacy called and stated that this medication is on back order and they do not know when it will be back in stock. Also she stated that [facility] sent in the same script. They were notifying us just as an fyi.

## 2021-03-03 ENCOUNTER — Telehealth: Payer: Self-pay | Admitting: Primary Care

## 2021-03-03 ENCOUNTER — Other Ambulatory Visit: Payer: Self-pay

## 2021-03-03 ENCOUNTER — Encounter: Payer: Self-pay | Admitting: Primary Care

## 2021-03-03 ENCOUNTER — Ambulatory Visit: Payer: Medicare (Managed Care) | Admitting: Primary Care

## 2021-03-03 VITALS — BP 140/84 | HR 76 | Temp 98.8°F | Ht 59.5 in

## 2021-03-03 DIAGNOSIS — D649 Anemia, unspecified: Secondary | ICD-10-CM | POA: Insufficient documentation

## 2021-03-03 DIAGNOSIS — F1190 Opioid use, unspecified, uncomplicated: Secondary | ICD-10-CM

## 2021-03-03 DIAGNOSIS — M169 Osteoarthritis of hip, unspecified: Secondary | ICD-10-CM | POA: Insufficient documentation

## 2021-03-03 DIAGNOSIS — M810 Age-related osteoporosis without current pathological fracture: Secondary | ICD-10-CM

## 2021-03-03 DIAGNOSIS — G35 Multiple sclerosis: Secondary | ICD-10-CM

## 2021-03-03 DIAGNOSIS — E039 Hypothyroidism, unspecified: Secondary | ICD-10-CM

## 2021-03-03 DIAGNOSIS — M545 Low back pain, unspecified: Secondary | ICD-10-CM

## 2021-03-03 MED ORDER — OXYCODONE HCL 10 MG PO TABS *I*
10.0000 mg | ORAL_TABLET | Freq: Four times a day (QID) | ORAL | 0 refills | Status: DC | PRN
Start: 2021-03-03 — End: 2021-03-24

## 2021-03-03 NOTE — Telephone Encounter (Signed)
She is coming in today

## 2021-03-03 NOTE — Progress Notes (Signed)
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 0.083% nebulizer solution   2.5 mg (0.0334 mg/kg), Nebulization, EVERY 15 MIN PRN, Wheezing, Starting on Mon 01/25/21 at 0909, For 2 doses, PACU  Given 01/25/2021 9:46 AM EDT 2.5 mg         atorvastatin (LIPITOR) tablet 20 mg   20 mg (0.267 mg/kg), Oral, EVERY EVENING, First dose on Mon 01/25/21 at 1700  Given 01/28/2021 5:19 PM EDT 20 mg      Given 01/27/2021 5:09 PM EDT 20 mg      Given 01/26/2021 5:01 PM EDT 20 mg         calcium carbonate (TUMS) chewable tablet 1,000 mg   1,000 mg (13.4 mg/kg), Oral, EVERY 8 HOURS PRN, Indigestion, Starting on Mon 01/25/21 at 1107, Post-op  Given 01/26/2021 5:03 PM EDT 1,000 mg         ceFAZolin (ANCEF) 2 gram/100 mL in dextrose (iso-os) IVPB 2 g   2 g (0.0267 g/kg), Intravenous, at 200 mL/hr, ONCE, Indications: SURGICAL PROPHYLAXIS, On Mon 01/25/21 at 1530, For 1 dose, Post-op, To be given for patients who weigh less than or equal to 120kg. 1st post-op dose to be given 8 hours after pre-op dose.  New Bag 01/25/2021 3:11 PM EDT 2 g 200 mL/hr        ceFAZolin (ANCEF) 2 gram/100 mL in dextrose (iso-os) IVPB 2 g   2 g (0.0267 g/kg), Intravenous, at 200 mL/hr, ONCE, Indications: SURGICAL PROPHYLAXIS, On Mon 01/25/21 at 2330, For 1 dose, Post-op, To be given for patients who weigh less than or equal to 120kg. 2nd post-op dose to be given 8 hours  after 1st post-op dose.  New Bag 01/25/2021 11:07 PM EDT 2 g 200 mL/hr        cephALEXin (KEFLEX) capsule 500 mg   500 mg (  6.68 mg/kg), Oral, 4 TIMES DAILY, Indications: SURGICAL PROPHYLAXIS, First dose on Tue 01/26/21 at 0600, For 7 days  Given 01/29/2021 11:25 AM EDT 500 mg      Given 01/29/2021 5:29 AM EDT 500 mg      Given 01/28/2021 10:37 PM EDT 500 mg         chlorhexidine gluconate 2 % towelette 1 each   1 each (9.5188 Each/kg = 2 application), Topical, ONCE, On Mon 01/25/21 at 0730, For 1 dose, Full body application per manufacturer's instructions, Pre-op  Given 01/25/2021 6:48 AM EDT 1 each         docusate sodium (COLACE) capsule 100 mg   100 mg (1.34 mg/kg), Oral, 2 TIMES DAILY, First dose on Mon 01/25/21 at 1145, Post-op, AVOID chewing or opening capsule - unpleasant taste  Given 01/29/2021 9:12 AM EDT 100 mg      Given 01/28/2021 10:37 PM EDT 100 mg      Given 01/28/2021 8:08 AM EDT 100 mg         ferrous sulfate EC tablet 324 mg   324 mg (4.33 mg/kg), Oral, 2 TIMES DAILY, First dose on Mon 01/25/21 at 1145, Post-op, Do NOT split or crush. May take with food to decrease GI upset. (enteric coated)  Given 01/29/2021 9:12 AM EDT 324 mg      Given 01/28/2021 10:37 PM EDT 324 mg      Given 01/28/2021 8:08 AM EDT 324 mg         gabapentin (NEURONTIN) capsule 1,200 mg   1,200 mg, Oral, 3 TIMES DAILY, First dose on Mon 01/25/21 at 1400  Given 01/29/2021 12:39 PM EDT 1,200 mg      Given 01/29/2021 9:12 AM EDT 1,200 mg      Given 01/28/2021 10:38 PM EDT 1,200 mg         gabapentin (NEURONTIN) capsule 300 mg   300 mg (4.01 mg/kg), Oral, ON CALL TO O.R., Starting on Mon 01/25/21 at 0645, For 1 dose, Pre-op  Given 01/25/2021 7:18 AM EDT 300 mg         glatiramer Syrg 40 mg   40 mg (0.535 mg/kg), Subcutaneous, THREE TIMES WEEKLY (Once per day on Mon Wed Fri), First dose (after last modification) on Mon 01/25/21 at 2200, Brand Name: Copaxone, Generic name: glatiramer, Form: Injectable, Length of Therapy: Indefinite, How soon  needed? (normally 72 hrs needed to procure): 0-24 hrs, Is this a patient supplied medication? Yes  Given 01/27/2021 8:54 PM EDT 40 mg      Given 01/25/2021 9:20 PM EDT 40 mg         HYDROmorphone (DILAUDID) injection 0.5 mg   0.5 mg (0.00668 mg/kg), Intravenous, EVERY 5 MIN PRN, Severe Pain (8-10), for pain rating 8-10, May be used if the pt is NPO, intolerant of PO meds, or for pain unrelieved by PO meds., Starting on Mon 01/25/21 at 0909, For 6 doses, PACU  Given 01/25/2021 10:11 AM EDT 0.5 mg  Left Arm    Given 01/25/2021 9:58 AM EDT 0.5 mg  Left Arm    Given 01/25/2021 9:49 AM EDT 0.5 mg         HYDROmorphone (DILAUDID) injection 0.5 mg   0.5 mg (0.00668 mg/kg), Intravenous, EVERY 2 HOURS PRN, Incidental Pain, Before PT, Starting on Mon 01/25/21 at 1107, For 2 days, Post-op, Hold for lethargy or respiratory rate <10  Given 01/26/2021 1:18 PM EDT 0.5 mg      Given 01/26/2021 5:37 AM EDT  0.5 mg      Given 01/25/2021 11:11 PM EDT 0.5 mg         lactated Ringers infusion   at 75 mL/hr, Intravenous, CONTINUOUS, Starting on Mon 01/25/21 at 0730, For 1 day, Pre-op  Restarted 01/25/2021 8:51 AM EDT       New Bag 01/25/2021 7:37 AM EDT  75 mL/hr        lactated Ringers infusion   at 150 mL/hr, Intravenous, CONTINUOUS, Starting on Mon 01/25/21 at 1100, For 24 hours, may heplock with adequate PO intake., Post-op  New Bag 01/25/2021 11:22 AM EDT  150 mL/hr        levothyroxine (SYNTHROID, LEVOTHROID) tablet 100 mcg   100 mcg (1.34 mcg/kg), Oral, DAILY, First dose on Tue 01/26/21 at 0900  Given 01/29/2021 9:11 AM EDT 100 mcg      Given 01/28/2021 8:08 AM EDT 100 mcg      Given 01/27/2021 8:59 AM EDT 100 mcg         omeprazole (PriLOSEC) capsule 20 mg   20 mg (0.267 mg/kg), Oral, EVERY MORNING BEFORE BREAKFAST, First dose on Tue 01/26/21 at 0730  Given 01/29/2021 9:12 AM EDT 20 mg      Given 01/28/2021 8:08 AM EDT 20 mg      Given 01/27/2021 8:59 AM EDT 20 mg         oxyCODONE (OxyCONTIN) 12 hr EXT REL tab 10 mg   10 mg (0.134  mg/kg), Oral, ON CALL TO O.R., Starting on Mon 01/25/21 at 0645, For 1 dose, Pre-op, Do NOT split or crush tablet. (extended release)  Given 01/25/2021 7:18 AM EDT 10 mg         oxyCODONE (ROXICODONE) immediate release tablet 10 mg   10 mg (0.134 mg/kg), Oral, ONCE PRN, Moderate Pain (5-7), pain 5-7, Starting on Mon 01/25/21 at 0909, For 1 dose, PACU  Given 01/25/2021 10:38 AM EDT 10 mg         oxyCODONE (ROXICODONE) immediate release tablet 10 mg   10 mg (0.134 mg/kg), Oral, Every 4 hours PRN, Severe Pain (8-10), Starting on Mon 01/25/21 at 1107, For 7 days, Post-op, Hold for lethargy or respiratory rate <10. If requiring more than 4 PRN doses for severe pain in a 24 hour period, please verify with provider prior to giving.  Given 01/26/2021 12:38 PM EDT 10 mg      Given 01/26/2021 8:37 AM EDT 10 mg      Given 01/26/2021 3:47 AM EDT 10 mg         oxyCODONE (ROXICODONE) immediate release tablet 10 mg   10 mg (0.134 mg/kg), Oral, Every 4 hours PRN, Moderate Pain (5-7), Starting on Tue 01/26/21 at 1453, For 140 hours, Post-op, Hold for lethargy or respiratory rate <10. If requiring more than 4 PRN doses for severe pain in a 24 hour period, please verify with provider prior to giving.         oxyCODONE (ROXICODONE) immediate release tablet 15 mg   15 mg (0.201 mg/kg), Oral, Every 4 hours PRN, Severe Pain (8-10), Starting on Tue 01/26/21 at 1453, For 140 hours, Post-op, Hold for lethargy or respiratory rate <10  Given 01/29/2021 11:24 AM EDT 15 mg      Given 01/29/2021 5:30 AM EDT 15 mg      Given 01/28/2021 10:49 PM EDT 15 mg         povidone-iodine (BETADINE) 5 % kit   Topical, ONCE, On Mon 01/25/21 at 0730, For  1 dose, Apply 2 swabs to each nare per product instructions, Pre-op  Given 01/25/2021 6:48 AM EDT 1 kit         rivaroxaban (XARELTO) tablet 20 mg   20 mg, Oral, DAILY WITH DINNER, First dose on Tue 01/26/21 at 1700, Take with food. For feeding tube administration, administer with enteral feeding.  Given 01/28/2021  5:19 PM EDT 20 mg      Given 01/27/2021 5:56 PM EDT 20 mg      Given 01/26/2021 5:01 PM EDT 20 mg         senna (SENOKOT) tablet 1 tablet   1 tablet, Oral, BEDTIME, First dose on Mon 01/25/21 at 2200, Post-op, Hold for loose stool  Given 01/27/2021 8:53 PM EDT 1 tablet      Given 01/26/2021 8:38 PM EDT 1 tablet      Given 01/25/2021 9:21 PM EDT 1 tablet  Discontinued Medications  - documented as of this encounter   Discontinued Medications   Medication Sig Discontinue Reason Start Date End Date   oxyCODONE 10 mg Oral immediate release tablet  Take 10 mg by mouth every 4 (four) hours if needed.  05/18/2020 01/29/2021   gabapentin 300 MG Oral capsule Indications: S/P lumbar spine operation Take 1 capsule by mouth 3 (three) times daily. Discontinued by another clinician 02/15/2019 01/29/2021   Historical Medications  - added in this encounter   This list may reflect changes made after this encounter.  Historical Medications   Medication Sig Dispensed Refills Start Date End Date   gabapentin 300 MG Oral capsule  Take 1,200 mg by mouth 3 (three) times daily.  0     Active and Recently Administered Medications  - documented in this encounter   Active and Recently Administered Medications - Legend  Legend    Given Not Given Canceled Entry Hold Due Other Actions    Time (Time) Time Time Time Time-Action      Active and Recently Administered  Medications - Scheduled  Scheduled    Medication Order 01/27/2021 01/28/2021 01/29/2021   acetaminophen (TYLENOL) tablet 975 mg  975 mg (13 mg/kg), Oral, EVERY 8 HOURS SCHEDULED, First dose on Mon 01/25/21 at 1400, Post-op, Maximum dose of acetaminophen is 4000 mg from all sources in 24 hours.    8546   2703   5009    0456   1352   2238    0531   1239      atorvastatin (LIPITOR) tablet 20 mg  20 mg (0.267 mg/kg), Oral, EVERY EVENING, First dose on Mon 01/25/21 at 1700    1709    1719         cephALEXin (KEFLEX) capsule 500 mg  500 mg (6.68 mg/kg), Oral, 4 TIMES DAILY, Indications: SURGICAL PROPHYLAXIS, First dose on Tue 01/26/21 at 0600, For 7 days    0508   1215   1709   2053    0525   1232   1719   2237    0529   1125      docusate sodium (COLACE) capsule 100 mg  100 mg (1.34 mg/kg), Oral, 2 TIMES DAILY, First dose on Mon 01/25/21 at 1145, Post-op, AVOID chewing or opening capsule - unpleasant taste    0858   2053    0808   2237    0912      ferrous sulfate EC tablet 324 mg  324 mg (4.33 mg/kg), Oral, 2 TIMES DAILY, First dose on Mon 01/25/21 at 1145, Post-op, Do NOT split or crush. May take with food to decrease GI upset. (enteric coated)    0859   2053    0808   2237    0912      gabapentin (NEURONTIN) capsule 1,200 mg  1,200 mg, Oral, 3 TIMES DAILY, First dose on Mon 01/25/21 at 1400    0859   1407   2053    0808   1352   2238    0912   1239      glatiramer Syrg 40 mg  40 mg (0.535 mg/kg), Subcutaneous, THREE TIMES WEEKLY (Once per day on Mon Wed Fri), First dose (after last modification) on Mon 01/25/21 at 2200, Brand Name: Copaxone, Generic name: glatiramer, Form: Injectable, Length of Therapy: Indefinite, How soon needed? (normally 72 hrs needed to procure): 0-24 hrs, Is this a patient supplied medication? Yes    2054  levothyroxine (SYNTHROID, LEVOTHROID) tablet 100 mcg  100 mcg (1.34 mcg/kg), Oral, DAILY, First dose on Tue 01/26/21 at 0900    0859    0808    0911      omeprazole (PriLOSEC) capsule 20  mg  20 mg (0.267 mg/kg), Oral, EVERY MORNING BEFORE BREAKFAST, First dose on Tue 01/26/21 at 0730    0859    0808    0912      rivaroxaban (XARELTO) tablet 20 mg  20 mg, Oral, DAILY WITH DINNER, First dose on Tue 01/26/21 at 1700, Take with food. For feeding tube administration, administer with enteral feeding.    1756    1719         senna (SENOKOT) tablet 1 tablet  1 tablet, Oral, BEDTIME, First dose on Mon 01/25/21 at 2200, Post-op, Hold for loose stool    2053    (2239)           Active and Recently Administered Medications - PRN  PRN    Medication Order 01/27/2021 01/28/2021 01/29/2021   albuterol (PROVENTIL,VENTOLIN) 0.083% nebulizer solution  2.5 mg (0.0334 mg/kg), Nebulization, Every 4 hours PRN, Shortness of Breath, Starting on Mon 01/25/21 at 1107               bisacodyL (DULCOLAX) suppository 10 mg  10 mg (0.134 mg/kg), Rectal, DAILY PRN, Constipation, No BM x 48 hours. Second line if MOM ineffective., Starting on Mon 01/25/21 at 1107, Post-op               calcium carbonate (TUMS) chewable tablet 1,000 mg  1,000 mg (13.4 mg/kg), Oral, EVERY 8 HOURS PRN, Indigestion, Starting on Mon 01/25/21 at 1107, Post-op               LORazepam (ATIVAN) injection 1 mg  1 mg (0.0134 mg/kg), Intravenous, Every 4 hours PRN, Anxiety, Starting on Mon 01/25/21 at 1107, For 7 days               magnesium hydroxide (MILK OF MAGNESIA) 400 mg/5 mL suspension 30 mL  30 mL (0.401 mL/kg), Oral, DAILY PRN, Constipation, First line for constipation, Starting on Mon 01/25/21 at 1107, Post-op, Shake Well.               metoclopramide HCl (REGLAN) injection 5 mg  5 mg (0.0668 mg/kg), Intravenous, Every 4 hours PRN, Nausea / Vomiting, if nausea not relieved by ondansetron, Starting on Mon 01/25/21 at 1107, Post-op               ondansetron (ZOFRAN) injection 4 mg  4 mg (0.0535 mg/kg), Intravenous, Every 4 hours PRN, Nausea / Vomiting, Starting on Mon 01/25/21 at 1107, Post-op, Give undiluted over at least 30 seconds, but preferably over 2 to 5  minutes.               oxyCODONE (ROXICODONE) immediate release tablet 10 mg  10 mg (0.134 mg/kg), Oral, Every 4 hours PRN, Moderate Pain (5-7), Starting on Tue 01/26/21 at 1453, For 140 hours, Post-op, Hold for lethargy or respiratory rate <10. If requiring more than 4 PRN doses for severe pain in a 24 hour period, please verify with provider prior to giving.               oxyCODONE (ROXICODONE) immediate release tablet 15 mg  15 mg (0.201 mg/kg), Oral, Every 4 hours PRN, Severe Pain (8-10), Starting on Tue 01/26/21 at 1453, For 140 hours, Post-op, Hold for lethargy or respiratory rate <10  0022   0426   5462   7035   0093   2053    0105   8182   9937   1696   7893   8101    7510   2585      Orders  - documented in this encounter   Orders   Medications Ordered That Might Not Have Been Administered Count Last Ordered Date First Ordered Date   oxyCODONE (ROXICODONE) immediate release tablet 10 mg 1 01/26/2021    albuterol (PROVENTIL,VENTOLIN) 0.083% nebulizer solution 1 01/25/2021    bisacodyL (DULCOLAX) suppository 10 mg 1 01/25/2021    ceFAZolin (ANCEF) 2 gram/100 mL in dextrose (iso-os) IVPB 2 g 1 01/25/2021    Copaxone (glatiramer) Syrg 40 mg 1 01/25/2021    dronabinoL (MARINOL) capsule 2.5 mg 1 01/25/2021    hydrALAZINE (APRESOLINE) injection 10 mg 1 01/25/2021    labetaloL (NORMODYNE, TRANDATE) injection Syrg 5 mg 1 01/25/2021    lactated Ringers infusion 1 01/25/2021    LORazepam (ATIVAN) injection 1 mg 1 01/25/2021    magnesium hydroxide (MILK OF MAGNESIA) 400 mg/5 mL suspension 30 mL 1 01/25/2021    metoclopramide HCl (REGLAN) injection 5 mg 1 01/25/2021    naloxone (NARCAN) injection 0.1 mg 1 01/25/2021    ondansetron (ZOFRAN) injection 4 mg 2 01/25/2021    oxyCODONE (ROXICODONE) immediate release tablet 5 mg 1 01/25/2021    traMADoL (ULTRAM) tablet 100 mg 1 01/25/2021    traMADoL (ULTRAM) tablet 50 mg 1 01/25/2021      Orders   Code Status Count Last Ordered Date First Ordered Date   FULL CODE 1 01/25/2021       Orders   Consult Count Last Ordered Date First Ordered Date   IP CONSULT TO SOCIAL WORK 1 01/25/2021      Orders   Admission Count Last Ordered Date First Ordered Date   ADMISSION TO IP LOC 1 01/26/2021      Orders   Transfer Count Last Ordered Date First Ordered Date   EXTREC - REFERRAL TO POST-PROCEDURAL EXTENDED RECOVERY LOC 2 01/25/2021      Orders   Discharge Count Last Ordered Date First Ordered Date   DISCHARGE PATIENT 1 01/29/2021    Care Teams  - documented as of this encounter   Care Teams   Team Member Relationship Specialty Start Date End Date   Lennon Alstrom, MD   [place], [place] [address]   [phone] (Work)   [phone] (Fax)  PCP - General Internal Medicine 11/14/19    Patient Demographics  Patient Demographics   Patient Address Communication Language Race / Ethnicity Marital Status   [address] (Home)  [place], [place] [address]    Former (Oct. 22, 2018 - Oct. 21, 2018):  [address] [facility])  [place], [place] [address] [phone] (Mobile)  [phone] (Home)  [email or web address]   English (Preferred) White / Not Hispanic or Latino Unknown   Patient Contacts  Patient Contacts   Contact Name Contact Address Communication Relationship to Patient   Hila Bolding [place], [place] [address] [phone] (Home)   Spouse, Emergency Contact   Lorann Tani Unknown [phone] (Home)   In-Law, Emergency Contact   Document Information  Primary Care Provider Other Service Providers Document Coverage Dates   Lennon Alstrom, MD (Jul. 08, 2021July 08, 2021 - Present)  [phone] (Work)  [phone] (Fax)  [address]  [place], [place] [address]  Internal Medicine   [facility]  [address]  [place], [place] [address]  Sep. 19, 2022September 19, 2022 - Sep. 23, 2022September 23, 2022     [facility]  [place], [place] [address]     Encounter Providers Encounter Date   Alfredia Client, MD (Attending,  Admitting)  [phone] (Work)  [phone] (Fax)  [place], [place] [address]   Orthopedic Surgery  Sep. 19, 2022September 19, 2022 - Sep. 23, 2022September 23, 2022         ROS  As per HPI    Allergies   Allergen Reactions    Bee Venom Anaphylaxis    Ketorolac Other (See Comments)     Muscle spasms    Meperidine Nausea And Vomiting and Other (See Comments)    Prochlorperazine Other (See Comments)     myoclonic        Tilactase Other (See Comments)     GI upset     Current Outpatient Medications   Medication    acetaminophen (TYLENOL) 500 mg tablet    docusate sodium (COLACE) 100 mg capsule    gabapentin (NEURONTIN) 300 mg capsule    XARELTO 20 MG tablet    LEVALBUTEROL 45 MCG/ACT inhaler    atorvastatin (LIPITOR) 40 mg tablet    albuterol HFA (PROVENTIL, VENTOLIN, PROAIR HFA) 108 (90 Base) MCG/ACT inhaler    psyllium (METAMUCIL) 28.3 % POWD powder    levothyroxine (SYNTHROID, LEVOTHROID) 100 mcg tablet    umeclidinium-vilanterol (ANORO ELLIPTA) 62.5-25 MCG/INH inhaler    cholecalciferol (VITAMIN D) 1,000 unit tablet    ascorbic acid (VITAMIN C) 100 MG tablet    Cyanocobalamin (VITAMIN B12) 1000 MCG TBCR    Multiple Vitamins-Minerals (CENTRUM SILVER 50+WOMEN) TABS    traZODone (DESYREL) 50 mg tablet    naloxone (NARCAN) 4 mg/0.1 mL nasal spray    omeprazole (PRILOSEC OTC) 20 MG tablet    dronabinol (MARINOL) 2.5 MG capsule    COPAXONE 40 MG/ML prefilled syringe    oxyCODONE (ROXICODONE) 10 mg immediate release tablet     No current facility-administered medications for this visit.       Past Medical History:   Diagnosis Date    Arthritis     Asthma     Chronic hypokalemia     Chronic low back pain     Depression     DVT (deep venous thrombosis)     Dysphagia     GERD (gastroesophageal reflux disease)     Heart disease     Hypercholesterolemia     Hypertension     Hypothyroidism     Multiple sclerosis     Multiple sclerosis, primary progressive      Osteoarthritis     Tobacco abuse      Social History     Socioeconomic History    Marital status: Married   Tobacco Use    Smoking status: Every Day     Packs/day: 1.00     Years: 30.00     Pack years: 30.00     Types: Cigarettes     Start date: 04/08/1980    Smokeless tobacco: Never   Substance and Sexual Activity    Alcohol use: Yes     Alcohol/week: 6.0 standard drinks     Types: 6 Standard drinks or equivalent per week    Drug use: Never    Sexual activity: Not Currently  Partners: Male     Birth control/protection: Post-menopausal         Vitals:    03/03/21 1506   BP: 140/84   Pulse: 76   Temp: 37.1 C (98.8 F)   Height: 1.511 m (4' 11.5")     Body mass index is 33.68 kg/m.    Physical Exam   On examination, blood pressure is 140/84 with a pulse of 76 and the patient has a temperature of 98.8 degree.  She is seen while sitting in the wheelchair and has difficulty standing up from a sitting position or climbing onto the exam table because of pain.  Head and neck exam shows no oral lesion or thyromegaly or bruits.  Chest shows decreased but clear breath sounds and heart exam is regular in rate and rhythm.  Her abdomen is soft without tenderness, rebound or guarding and extremity shows no clubbing, cyanosis or edema.    Assessment/Plan    1. Hypothyroidism, unspecified type  She suffers from hypothyroidism.  I have asked her to recheck her thyroid function studies and to continue current dose of levothyroxine.  - Comprehensive metabolic panel; Future  - TSH; Future  - CBC; Future  - Vitamin B12; Future  - Hemoglobin A1c; Future    2. Multiple sclerosis  She has MS and is receiving Copaxone on a daily basis.  She will follow-up with her neurologist and physical therapy.  - Lipid Panel (Reflex to Direct  LDL if Triglycerides more than 400); Future  - Comprehensive metabolic panel; Future  - TSH; Future  - CBC; Future  - Vitamin B12; Future    3. OA (osteoarthritis) of hip  She has osteoarthritis of the  hip and underwent recent hip surgery.  Surgery appears to be slow but progressive.  She was encouraged to continue physical therapy and oral pain medicine.    4. Osteoporosis  She has noted osteoporosis.  We will need to have repeat DEXA scan but in the meantime we will continue calcium and vitamin D and may benefit from bisphosphonate therapy.    5. Anemia, unspecified type  He has a history of anemia.  I requested a repeat CBC and iron studies.  - CBC; Future  - Vitamin B12; Future  - Folate; Future  - Ferritin; Future  - Iron; Future  - Hemoglobin A1c; Future    6. Low back pain  She has chronic low back pain for which she takes oral pain medication and high doses of gabapentin for which will be continued.    7. Chronic narcotic use  Long history of narcotic usage.  May benefit from seeing pain management or a trial of Duragesic patch for more effective pain management.  In the meantime she will continue with oxycodone and a new prescription was called in on today's visit.      No follow-ups on file.

## 2021-03-03 NOTE — Telephone Encounter (Signed)
The pharmacy does not have the oxycodone to fill the script that was sent in yesterday.  Can we resend the script to [facility] in [place].  I added this pharmacy in her chart.

## 2021-03-04 NOTE — Telephone Encounter (Signed)
Addressed at office visit on 03/03/21

## 2021-03-09 ENCOUNTER — Telehealth: Payer: Self-pay | Admitting: Primary Care

## 2021-03-09 HISTORY — PX: HIP REPLACEMENT: SHX530A

## 2021-03-09 NOTE — Telephone Encounter (Signed)
Julia Burgess called on October 24 evening around 7 pm inquiring about her oxycodone prescription.  Per her report she requested a refill for oxycodone and has not heard anything from the pharmacy.     Per her report she has only 1 day supply left for oxycodone.  Says she requested for this refill early today.  I told her that Dr. Dallie Piles works late on Monday evenings and he probably still seeing patients in the office.  Once he gets a moment he probably will send her prescription as requested .

## 2021-03-16 ENCOUNTER — Encounter: Payer: Self-pay | Admitting: Gastroenterology

## 2021-03-24 ENCOUNTER — Other Ambulatory Visit: Payer: Self-pay | Admitting: Primary Care

## 2021-03-24 ENCOUNTER — Encounter: Payer: Self-pay | Admitting: Gastroenterology

## 2021-03-24 NOTE — Telephone Encounter (Signed)
Last filled 03/04/2021- it is too early.

## 2021-03-24 NOTE — Telephone Encounter (Signed)
Oxycodone 10mg   [place]

## 2021-03-24 NOTE — Telephone Encounter (Signed)
I-Stop completed, last filled: 02/19/21  Last office visit: 10/26/2  Scheduled office visit: 03/30/2021     Not sure if you wanted to refill

## 2021-03-26 MED ORDER — OXYCODONE HCL 10 MG PO TABS *I*
10.0000 mg | ORAL_TABLET | Freq: Four times a day (QID) | ORAL | 0 refills | Status: DC | PRN
Start: 2021-03-26 — End: 2021-04-28

## 2021-03-26 NOTE — Telephone Encounter (Signed)
Dr. Dallie Piles refused the Hydrocodone and said that she should not be still taking this.  He sent in the Oxycodone.

## 2021-03-30 ENCOUNTER — Ambulatory Visit: Payer: Medicare (Managed Care) | Admitting: Primary Care

## 2021-04-06 ENCOUNTER — Ambulatory Visit: Payer: Medicare (Managed Care) | Admitting: Primary Care

## 2021-04-14 ENCOUNTER — Ambulatory Visit: Payer: Medicare (Managed Care) | Admitting: Primary Care

## 2021-04-26 ENCOUNTER — Encounter: Payer: Self-pay | Admitting: Gastroenterology

## 2021-04-28 ENCOUNTER — Other Ambulatory Visit
Admission: RE | Admit: 2021-04-28 | Discharge: 2021-04-28 | Disposition: A | Discharge status: Home / Self Care | Payer: Medicare (Managed Care) | Source: Ambulatory Visit | Attending: Primary Care | Admitting: Primary Care

## 2021-04-28 ENCOUNTER — Other Ambulatory Visit: Payer: Self-pay

## 2021-04-28 ENCOUNTER — Encounter: Payer: Self-pay | Admitting: Primary Care

## 2021-04-28 ENCOUNTER — Ambulatory Visit: Payer: Medicare (Managed Care) | Admitting: Primary Care

## 2021-04-28 VITALS — BP 120/76 | HR 88 | Temp 97.5°F | Ht 59.5 in | Wt 174.1 lb

## 2021-04-28 DIAGNOSIS — D649 Anemia, unspecified: Secondary | ICD-10-CM

## 2021-04-28 DIAGNOSIS — E785 Hyperlipidemia, unspecified: Secondary | ICD-10-CM

## 2021-04-28 DIAGNOSIS — G35 Multiple sclerosis: Secondary | ICD-10-CM

## 2021-04-28 DIAGNOSIS — M545 Low back pain, unspecified: Secondary | ICD-10-CM

## 2021-04-28 DIAGNOSIS — E039 Hypothyroidism, unspecified: Secondary | ICD-10-CM

## 2021-04-28 DIAGNOSIS — X58XXXA Exposure to other specified factors, initial encounter: Secondary | ICD-10-CM | POA: Insufficient documentation

## 2021-04-28 DIAGNOSIS — M810 Age-related osteoporosis without current pathological fracture: Secondary | ICD-10-CM

## 2021-04-28 DIAGNOSIS — Z01818 Encounter for other preprocedural examination: Secondary | ICD-10-CM

## 2021-04-28 DIAGNOSIS — Y9289 Other specified places as the place of occurrence of the external cause: Secondary | ICD-10-CM | POA: Insufficient documentation

## 2021-04-28 DIAGNOSIS — S3210XA Unspecified fracture of sacrum, initial encounter for closed fracture: Secondary | ICD-10-CM | POA: Insufficient documentation

## 2021-04-28 LAB — URINALYSIS REFLEX TO CULTURE
Blood,UA: NEGATIVE
Glucose,UA: NEGATIVE
Ketones, UA: NEGATIVE
Nitrite,UA: NEGATIVE
Specific Gravity,UA: 1.021 (ref 1.002–1.030)
pH,UA: 6.5 (ref 5.0–8.0)

## 2021-04-28 LAB — LIPID PANEL
Chol/HDL Ratio: 5.6
Cholesterol: 280 mg/dL — AB
HDL: 50 mg/dL (ref 40–60)
Non HDL Cholesterol: 230 mg/dL
Triglycerides: 446 mg/dL — AB

## 2021-04-28 LAB — IRON: Iron: 99 ug/dL (ref 34–165)

## 2021-04-28 LAB — PROTIME-INR
INR: 2 — ABNORMAL HIGH (ref 0.9–1.1)
Protime: 22.3 s — ABNORMAL HIGH (ref 10.0–12.9)

## 2021-04-28 LAB — COMPREHENSIVE METABOLIC PANEL
ALT: 18 U/L (ref 0–35)
AST: 17 U/L (ref 0–35)
Albumin: 4.3 g/dL (ref 3.5–5.2)
Alk Phos: 125 U/L — ABNORMAL HIGH (ref 35–105)
Anion Gap: 14 (ref 7–16)
Bilirubin,Total: 0.3 mg/dL (ref 0.0–1.2)
CO2: 23 mmol/L (ref 20–28)
Calcium: 9.5 mg/dL (ref 8.6–10.2)
Chloride: 102 mmol/L (ref 96–108)
Creatinine: 0.6 mg/dL (ref 0.51–0.95)
Glucose: 100 mg/dL — ABNORMAL HIGH (ref 60–99)
Lab: 7 mg/dL (ref 6–20)
Potassium: 4.5 mmol/L (ref 3.3–5.1)
Sodium: 139 mmol/L (ref 133–145)
Total Protein: 6.6 g/dL (ref 6.3–7.7)
eGFR BY CREAT: 102 *

## 2021-04-28 LAB — FOLATE: Folate: 6 ng/mL (ref >=4.6)

## 2021-04-28 LAB — URINE MICROSCOPIC (IQ200): Bacteria,UA: NONE SEEN

## 2021-04-28 LAB — LDL CHOLESTEROL, DIRECT: LDL Direct: 161 mg/dL — AB

## 2021-04-28 LAB — CBC
Hematocrit: 42 % (ref 34–45)
Hemoglobin: 13.5 g/dL (ref 11.2–15.7)
MCH: 31 pg (ref 26–32)
MCHC: 32 g/dL (ref 32–36)
MCV: 97 fL — ABNORMAL HIGH (ref 79–95)
Platelets: 449 10*3/uL — ABNORMAL HIGH (ref 160–370)
RBC: 4.3 MIL/uL (ref 3.9–5.2)
RDW: 14.6 % — ABNORMAL HIGH (ref 11.7–14.4)
WBC: 8.3 10*3/uL (ref 4.0–10.0)

## 2021-04-28 LAB — FERRITIN: Ferritin: 36 ng/mL (ref 10–120)

## 2021-04-28 LAB — TSH: TSH: 0.86 u[IU]/mL (ref 0.27–4.20)

## 2021-04-28 LAB — VITAMIN D: 25-OH Vit Total: 23 ng/mL — ABNORMAL LOW (ref 30–60)

## 2021-04-28 LAB — APTT: aPTT: 34.1 s (ref 25.8–37.9)

## 2021-04-28 LAB — VITAMIN B12: Vitamin B12: 582 pg/mL (ref 232–1245)

## 2021-04-28 MED ORDER — OXYCODONE HCL 10 MG PO TABS *I*
10.0000 mg | ORAL_TABLET | Freq: Four times a day (QID) | ORAL | 0 refills | Status: DC | PRN
Start: 2021-04-28 — End: 2021-06-03

## 2021-04-28 NOTE — Progress Notes (Signed)
Julia Burgess is a 60 y.o. female (1961-01-24)    Nurses note:   Follow-up (2 month check  up, didn't do blood work )      CC: Julia Burgess presents today for a follow-up evaluation but unfortunately did not do the requested serologies.    HPI: This is a 60 year old female patient with a complicated past medical history consisting of hypertension, dyslipidemia, multiple sclerosis, chronic lumbar pain as result of lumbar stenosis, anxiety, depression, hypothyroidism and recent total hip replacement as result of osteoarthritis of the hip.  On multiple medicines including levothyroxine, oxycodone, acetaminophen, gabapentin, Xarelto, leave albuterol, atorvastatin, Metamucil, Anoro inhaler, vitamin D, vitamin C, vitamin B12, multivitamins, trazodone, omeprazole and dronabinol along with Copaxone.  She recently underwent a hip replacement but with there was some suspected intraoperative complication.  She is being followed closely by her orthopedic surgeon.  To complicate matters, the patient appears to be completely dependent on pain medication which she has used for a number of years.  She was requested to have blood testing which has not been done.  She is willing to have this done today.  She denies fevers, chills or night sweats, nausea, vomiting or changes in bowel or bladder function.  She complains of pain in her lower extremities.  [place]  Outside Information  Office Visit  03/16/2021  [facility]  Encounter Summary      Julia Burgess "Julia Burgess" - 60 y.o. Female; born [birth year]Encounter Summary, generated on Dec. 21, 2022December 21, 2022   Reason for Referral    IMG X-RAY (Routine) - Closed  Reason for Referral - IMG X-RAY (Routine) - Closed  Specialty Diagnoses / Procedures Referred By Contact Referred To Contact   Radiology Diagnoses   Orthopedic aftercare      Procedures   XR pelvis AP only  Alfredia Client, MD   [place]   [address], [place] [address]   Phone: [phone]   Fax: [phone]        Reason for Referral - IMG X-RAY (Routine) - Closed  Referral ID Status Reason Start Date Expiration Date Visits Requested Visits Authorized   [id number] Closed Specialty Services Required  03/15/2021 03/15/2022 1 1      Electronically signed by Alfredia Client MD at 03/15/2021 7:55 PM EST   Reason for Visit    Reason for Visit -   Reason Comments   Follow-up L hip      Encounter Details    Encounter Details  Date Type Department Care Team Description   03/16/2021 Office Visit [place]   [address] [place], [place] [address]   [phone]  Alfredia Client, [address]   [place], [place] [address]   [phone] (Work)   [phone] (Fax)  Orthopedic aftercare (Primary Dx)     Social History  - documented as of this encounter  Social History  Tobacco Use Types Packs/Day Years Used Date   Smoking Tobacco: Every Day Cigarettes 1 35    Smokeless Tobacco: Never         Social History  Alcohol Use Standard Drinks/Week Comments   Yes 0 (1 standard drink = 0.6 oz pure alcohol) 2-3 drinks/ 3-4 nights per week      Social History  Sex Assigned at Birth Date Recorded   Female 01/03/2020 10:09  PM EDT     Social History  Job Start Date Occupation Industry   Not on file Not on file Not on file     Social History  COVID-19 Exposure Response Date Recorded   In the last 10 days, have you been in contact with someone who was confirmed or suspected to have Coronavirus/COVID-19? No / Unsure 03/16/2021 1:19 PM EST     Last Filed Vital Signs  - documented in this encounter  Last Filed Vital Signs  Vital Sign Reading Time Taken Comments   Blood Pressure - -    Pulse - -    Temperature - -    Respiratory Rate - -    Oxygen Saturation - -    Inhaled Oxygen Concentration - -    Weight 74.4 kg (164 lb) 03/16/2021 1:23 PM EST    Height 149.9 cm (4\' 11" ) 03/16/2021 1:23 PM EST     Body Mass Index 33.12 03/16/2021 1:23 PM EST      Progress Notes  - documented in this encounter  Alfredia Client, MD - 03/16/2021 1:28 PM EST  Formatting of this note might be different from the original.  Images from the original note were not included.  Orthopedic Surgery Follow Up Visit Clinical Note    Chief Complaint: Follow up Left posterior THA dated 01/25/21    Interval history: Julia Burgess is a 60 y.o. female who follows up for the above listed surgical procedure. Today, the patient reports that they are improving gradually. She continues to have some intermittent nerve symptoms down the left leg but states that these are also improving. She is ambulating with the assistance of a rolling walker. She remains on chronic narcotics prescribed by her primary care physician but is back to her baseline dosages. Continues to have some intermittent low back pain with radiating symptoms on the left side as well. Urinary continence fluctuates and this has been fairly consistent since her cauda equina in the past. Overall is optimistic and believes she is improving    Patient's past medical history, medications, allergies, surgical history, review of systems, social history, and family history are documented in the orthopaedic intake sheet, reviewed by myself and scanned into the Electronic Medical Record.     Physical Examination: The patients height, weight and body-mass index are documented in the orthopaedic intake sheet, reviewed by me and scanned into the electronic medical record.     Constitutional: General appearance: appears stated age, well built, in no apparent distress    Musculoskeletal: Examination of the left lower Extremity reveals a healed incision. There is no erythema, drainage or concerns for infection. No areas that are tender to palpation. Tolerates a normal, gentle arc of motion without pain. She is able to actively extend the knee fully and flex without difficulty. She is able to dorsiflex and  plantarflex ankle slowly and with some pain, but strength is 4 out of 5. She has intact sensation to light touch in all nervous distributions including the deep peroneal and superficial peroneal distributions. These are slightly decreased but intact. Palpable distal pulses    Imaging: Weightbearing AP radiograph of the pelvis obtained at today's visit and personally reviewed. Demonstrate reasonably aligned prosthetic components without signs of fracture or dislocation. Also visualized are significant lumbar spine instrumentation and hardware. Left acetabular component appears to be slightly vertical however the pelvic view is somewhat of an outlet view based upon her stiff lumbar spine.    EMG: Recent EMG performed on  02/11/2021 personally reviewed in the office today. The findings of the study are particularly interesting as they do show some electrodiagnostic evidence suggestive of a mild left sciatic mononeuropathy with no definitive evidence of a lumbar radiculopathy or lumbosacral plexopathy. At the time of the EMG, further delineation of her neuropathy was somewhat difficult due to her overlying pain on examination. The EMG provider did note that the mononeuropathy was mild and was anticipated to be improved over the course of time.    Assessment and Plan: Denaja Verhoeven is a 60 y.o. female who follows up after a left posterior approach total hip arthroplasty performed on 01/25/2021. Overall Annistyn is slowly improving and heading in the right direction. We once again discussed her postoperative course. She did have intact dorsiflexion plantarflexion of the ankle as well as intact sensation acutely postoperatively however this slowly waned after the second day. We discussed her EMG findings and its relationship to her hip replacement. I do wonder if this is more of a second crush type of situation given her previous cauda equina syndrome and lumbar spine pathology. I do not think that there was any intraoperative  damage to her sciatic nerve given her immediate postoperative findings and intact neurologic examination. Nonetheless her symptoms do appear to be improving. Her peroneal nerve is functioning at today's visit. I think that with some additional time she will only continue to improve. She reports no pain in the hip when ambulating and is thankful to report this. I will see her back in 2 months for repeat clinical follow-up    Follow up: 2 months    Electronically signed by:    03/16/2021, 1:28 PM    Joycelyn Das. Ardine Eng, MD  Orthopedic Surgeon  [facility]    Electronically signed by Alfredia Client, MD at 03/16/2021 1:43 PM EST   Plan of Treatment  - documented as of this encounter  Plan of Treatment - Upcoming Encounters  Upcoming Encounters  Date Type Specialty Care Team Description   05/04/2021 Office Visit Orthopedic Surgery Tretha Sciara, PA-C   [place], [place] [address]   [phone] (Work)   [phone] (Fax)     05/18/2021 Office Visit Orthopedic Surgery Alfredia Client, MD   [place]   [address], [place] [address]   [phone] (Work)   [phone] (Fax)     08/31/2021 Office Visit Endocrinology Consuelo Pandy, MD   [address]   [place], [place] [address]   [phone] (Work)   [phone] (Fax)       Miscellaneous Results  - documented in this encounter  XR pelvis AP only (03/16/2021 1:27 PM EST)  Miscellaneous Results - XR pelvis AP only (03/16/2021 1:27 PM EST)  Specimen (Source) Anatomical Location / Laterality Collection Method / Volume Collection Time Received Time            Miscellaneous Results - XR pelvis AP only (03/16/2021 1:27 PM EST)  Narrative   [facility] - 03/16/2021 1:27 PM EST   The final interpretation was done by [place] [facility] and can be obtained by calling [phone] or the   progress note written by Alfredia Client, MD on  03/16/21.       Miscellaneous Results - XR pelvis AP only (03/16/2021 1:27 PM EST)  Authorizing Provider Result Type   Alfredia Client MD  IMG DIAGNOSTIC IMAGING ORDERABLES     Miscellaneous Results - XR pelvis AP only (03/16/2021 1:27 PM EST)  Performing Organization Address City/State/ZIP Code Phone Number   [place]          Visit Diagnoses  - documented in this encounter  Visit Diagnoses  Diagnosis   Orthopedic aftercare - Primary   Unspecified orthopedic aftercare      Care Teams  - documented as of this encounter  Care Teams  Team Member Relationship Specialty Start Date End Date   Lennon Alstrom, MD   [place], [place] [address]   [phone] (Work)   [phone] (Fax)  PCP - General Internal Medicine 11/14/19      Images  Patient Demographics    Patient Demographics  Patient Address Communication Language Race / Ethnicity Marital Status   Former (Sep. 07, 2011 - Sep. 07, 2011):  [place] [address])  [place] , [place] [address]    (Oct. 22, 2018 - ):  [place] [address])  [place], [place] [address] [phone] (Mobile)  [phone] (Home)  [email or web address] English (Preferred) White / Not Hispanic or Latino Unknown     Patient Contacts    Patient Contacts  Contact Name Contact Address Communication Relationship to Patient   Malkie Wille [address] [place], [place] [address] [phone] (Home) Spouse, Emergency Contact   Dimond Crotty Unknown [phone] (Home) In-Law, Emergency Contact     Document Information    Primary Care Provider Other Service Providers Document Coverage Dates   Lennon Alstrom, MD (Jul. 08, 2021July 08, 2021 - Present)  [phone] (Work)  [phone] (Fax)  [place], [place] [address]  Internal Medicine  [facility]  [address]  [place], [place] [address]  Nov. 08, 2022November 08, 2022     [facility]  [place], [place] [address]     Encounter Providers  Encounter Date   Alfredia Client, MD (Attending)  [phone] (Work)  [phone] (Fax)  [place], [place] [address]  Orthopedic Surgery Nov. 08, 2022November 08, 2022      Show All Sections       ROS  As per HPI    Allergies   Allergen Reactions    Bee Venom Anaphylaxis    Ketorolac Other (See Comments)     Muscle spasms    Meperidine Nausea And Vomiting and Other (See Comments)    Prochlorperazine Other (See Comments)     myoclonic        Tilactase Other (See Comments)     GI upset     Current Outpatient Medications   Medication    oxyCODONE (ROXICODONE) 10 mg immediate release tablet    acetaminophen (TYLENOL) 500 mg tablet    docusate sodium (COLACE) 100 mg capsule    gabapentin (NEURONTIN) 300 mg capsule    XARELTO 20 MG tablet    LEVALBUTEROL 45 MCG/ACT inhaler    atorvastatin (LIPITOR) 40 mg tablet    albuterol HFA (PROVENTIL, VENTOLIN, PROAIR HFA) 108 (90 Base) MCG/ACT inhaler    psyllium (METAMUCIL) 28.3 % POWD powder    levothyroxine (SYNTHROID, LEVOTHROID) 100 mcg tablet    umeclidinium-vilanterol (ANORO ELLIPTA) 62.5-25 MCG/INH inhaler    cholecalciferol (VITAMIN D) 1,000 unit tablet    ascorbic acid (VITAMIN C) 100 MG tablet    Cyanocobalamin (VITAMIN B12) 1000 MCG TBCR    Multiple  Vitamins-Minerals (CENTRUM SILVER 50+WOMEN) TABS    traZODone (DESYREL) 50 mg tablet    naloxone (NARCAN) 4 mg/0.1 mL nasal spray    omeprazole (PRILOSEC OTC) 20 MG tablet    dronabinol (MARINOL) 2.5 MG capsule    COPAXONE 40 MG/ML prefilled syringe     No current facility-administered medications for this visit.       Past Medical History:   Diagnosis Date    Arthritis     Asthma     Chronic hypokalemia     Chronic low back pain     Depression     DVT (deep venous thrombosis)     Dysphagia     GERD (gastroesophageal reflux disease)     Heart disease     Hypercholesterolemia     Hypertension     Hypothyroidism     Multiple sclerosis     Multiple sclerosis,  primary progressive     Osteoarthritis     Tobacco abuse      Social History     Socioeconomic History    Marital status: Married   Tobacco Use    Smoking status: Every Day     Packs/day: 1.00     Years: 30.00     Pack years: 30.00     Types: Cigarettes     Start date: 04/08/1980    Smokeless tobacco: Never   Substance and Sexual Activity    Alcohol use: Yes     Alcohol/week: 6.0 standard drinks     Types: 6 Standard drinks or equivalent per week    Drug use: Never    Sexual activity: Not Currently     Partners: Male     Birth control/protection: Post-menopausal         Vitals:    04/28/21 1300   BP: 120/76   Pulse: 88   Temp: 36.4 C (97.5 F)   Weight: 79 kg (174 lb 1.6 oz)   Height: 1.511 m (4' 11.5")     Body mass index is 34.58 kg/m.    Physical Exam   On examination, her blood pressure is 120/76 with a pulse of 88 and the patient is afebrile with a BMI of 34%.  She appears to be in some minimal distress especially when asked to climb onto the exam table.  Her head and neck exam is unremarkable for oral lesion, thyromegaly or carotid bruit.  Her chest is clear and heart exam is regular in rate and rhythm.  Her abdomen is non-obese but is soft without tenderness, rebound or guarding.  Extremities shows no clubbing, cyanosis nor edema.    Assessment/Plan    1. Hypothyroidism, unspecified type  The patient has a history of hypothyroidism and currently receives levothyroxine on a daily basis.  I have asked her to repeat her thyroid function testing today's visit.    2. Multiple sclerosis  She has MS and takes Copaxone on a daily basis which will be continued.    3. Osteoporosis  She has suspected osteoporosis.  I did review her previous DEXA scan that confirmed this finding.  She will continue with calcium and vitamin D and will be a candidate for bisphosphonates.    4. Anemia, unspecified type  She has anemia.  I have asked her to repeat the CBC and lieu of her use of Xarelto.    5. Dyslipidemia  She  suffers from stable dyslipidemia we will continue with atorvastatin.  We will check lipid profile.  6. Low back pain  She suffers chronic low back pain as result of previous lumbar spine surgery.  Had a recent hip replacement surgery that appears to be slowly improving.  We discussed this in some great detail and felt that the patient would benefit from continuation of medication.  She should also continue physical therapy.      No follow-ups on file.

## 2021-04-29 LAB — HEMOGLOBIN A1C: Hemoglobin A1C: 5.6 %

## 2021-05-11 ENCOUNTER — Other Ambulatory Visit: Payer: Self-pay | Admitting: Primary Care

## 2021-05-11 MED ORDER — ALBUTEROL SULFATE HFA 108 (90 BASE) MCG/ACT IN AERS *I*
1.0000 | INHALATION_SPRAY | Freq: Four times a day (QID) | RESPIRATORY_TRACT | 5 refills | Status: DC | PRN
Start: 2021-05-11 — End: 2021-10-27

## 2021-05-11 MED ORDER — RIVAROXABAN 20 MG PO TABS *I*
20.0000 mg | ORAL_TABLET | Freq: Every day | ORAL | 5 refills | Status: DC
Start: 2021-05-11 — End: 2021-05-12

## 2021-05-11 NOTE — Telephone Encounter (Signed)
Last seen: 04/28/21  No future appts scheduled

## 2021-05-12 ENCOUNTER — Other Ambulatory Visit: Payer: Self-pay | Admitting: Primary Care

## 2021-05-12 MED ORDER — RIVAROXABAN 20 MG PO TABS *I*
20.0000 mg | ORAL_TABLET | Freq: Every day | ORAL | 1 refills | Status: DC
Start: 2021-05-12 — End: 2021-08-17

## 2021-05-12 NOTE — Telephone Encounter (Signed)
Her insurance covers a 90 day supply

## 2021-05-12 NOTE — Telephone Encounter (Signed)
04/28/2021

## 2021-05-18 ENCOUNTER — Encounter: Payer: Self-pay | Admitting: Gastroenterology

## 2021-05-25 ENCOUNTER — Encounter: Payer: Self-pay | Admitting: Gastroenterology

## 2021-05-31 ENCOUNTER — Other Ambulatory Visit: Payer: Self-pay | Admitting: Primary Care

## 2021-05-31 NOTE — Telephone Encounter (Signed)
04/28/2021  Next 06/08/2021

## 2021-06-01 ENCOUNTER — Telehealth: Payer: Self-pay | Admitting: Primary Care

## 2021-06-01 NOTE — Telephone Encounter (Signed)
Oxycodone [facility]ci 10mg   Patient called and stated the pharmacy is out of the 10mg . They do have 5mg  and 20mg 

## 2021-06-02 NOTE — Telephone Encounter (Signed)
Julia Burgess

## 2021-06-02 NOTE — Telephone Encounter (Signed)
Was she talking about the [facility] or was she trying to go back to [place]?

## 2021-06-03 MED ORDER — OXYCODONE HCL 10 MG PO TABS *I*
10.0000 mg | ORAL_TABLET | Freq: Four times a day (QID) | ORAL | 0 refills | Status: DC | PRN
Start: 2021-06-03 — End: 2021-06-30

## 2021-06-03 NOTE — Telephone Encounter (Signed)
I spoke with the pharmacist at [facility]. He has #100 of the 10mg  tablets. I set the script up for that. She will have to call when she needs a refill because this is not her whole 30 day supply.

## 2021-06-03 NOTE — Telephone Encounter (Signed)
She is due for this today.  Can someone else send the script in for her to [facility]?

## 2021-06-08 ENCOUNTER — Other Ambulatory Visit: Payer: Self-pay

## 2021-06-08 ENCOUNTER — Encounter: Payer: Self-pay | Admitting: Primary Care

## 2021-06-08 ENCOUNTER — Ambulatory Visit: Payer: Medicare (Managed Care) | Admitting: Primary Care

## 2021-06-08 VITALS — BP 156/98 | HR 85 | Temp 97.2°F | Ht 59.5 in | Wt 180.7 lb

## 2021-06-08 DIAGNOSIS — M79605 Pain in left leg: Secondary | ICD-10-CM

## 2021-06-08 DIAGNOSIS — E039 Hypothyroidism, unspecified: Secondary | ICD-10-CM

## 2021-06-08 DIAGNOSIS — G35 Multiple sclerosis: Secondary | ICD-10-CM

## 2021-06-08 DIAGNOSIS — E785 Hyperlipidemia, unspecified: Secondary | ICD-10-CM

## 2021-06-08 DIAGNOSIS — J449 Chronic obstructive pulmonary disease, unspecified: Secondary | ICD-10-CM

## 2021-06-08 DIAGNOSIS — I10 Essential (primary) hypertension: Secondary | ICD-10-CM

## 2021-06-08 MED ORDER — AMLODIPINE-OLMESARTAN 5-20 MG PO TABS *A*
1.0000 | ORAL_TABLET | Freq: Every day | ORAL | 1 refills | Status: DC
Start: 2021-06-08 — End: 2022-04-13

## 2021-06-08 NOTE — Progress Notes (Signed)
Julia Burgess is a 61 y.o. female ([birth year])    Nurses note:   Follow-up (4 week check up )      CC: [name] presents today for a follow-up evaluation.  Complains of ongoing low back pain with radiation of the pain down her left lower extremity.    HPI: Julia Burgess is a 61 year old female patient with a known history of hypertension, hyperlipidemia, multiple sclerosis, anxiety and depression, hypothyroidism reflux disease, clotting disorder, previous DVT, and osteoporosis and is status post total left hip arthroplasty 01/29/2021, sacral fracture 12/12/2019.  The patient underwent a revision lumbar decompression of L4/S1 with instrumented fusion approximately 1-1/2 years ago.  Postoperatively she developed sacral insufficiency fractures that were managed conservatively.  She does have a longstanding history of osteoporosis.  She was seen recently by her orthopedic surgeon after having undergone a left total hip arthroplasty and apparently was doing quite well.  She also had EMG studies done on 02/11/2021 which revealed a mild left sciatic mononeuropathy with no definitive evidence of a lumbar radiculopathy or lumbar sacral plexopathy.  Unfortunately, for some reason she became dissatisfied with the orthopedic surgeon and sought the advice of a different doctor for weakness and pain in her left lower extremity and noted weakness with left ankle dorsiflexion as weakness in plantar flexion.  X-rays showed the sequelae of previous L4/S1 fusion with a well-healed sacral fracture.  She was requested to have an updated MRI of her lumbar spine and suggested that she may benefit from an AFO brace.  She tells me she has an appointment for a follow-up with her new spinal surgeon later today.  On today's visit, she complains of the aforementioned left lower extremity weakness and weakness in left dorsiflexion.  She denies headaches, shortness of breath, nausea, vomiting, diarrhea, constipation, fevers, chills or night  sweats.  Unfortunately, she continues to smoke despite repeated request to cut back and quit smoking.  Still using high doses of pain medication in the form of oxycodone.  She also takes Xarelto, acetaminophen, gabapentin, atorvastatin, levothyroxine, vitamin D, vitamin C, vitamin B12, multivitamins with minerals, trazodone, omeprazole, dronabinol, Copaxone.    ROS  As per HPI    Allergies   Allergen Reactions    Bee Venom Anaphylaxis    Ketorolac Other (See Comments)     Muscle spasms    Meperidine Nausea And Vomiting and Other (See Comments)    Prochlorperazine Other (See Comments)     myoclonic        Tilactase Other (See Comments)     GI upset     Current Outpatient Medications   Medication    oxyCODONE (ROXICODONE) 10 mg immediate release tablet    rivaroxaban (XARELTO) 20 mg tablet    albuterol HFA (PROVENTIL, VENTOLIN, PROAIR HFA) 108 (90 Base) MCG/ACT inhaler    acetaminophen (TYLENOL) 500 mg tablet    docusate sodium (COLACE) 100 mg capsule    gabapentin (NEURONTIN) 300 mg capsule    LEVALBUTEROL 45 MCG/ACT inhaler    atorvastatin (LIPITOR) 40 mg tablet    psyllium (METAMUCIL) 28.3 % POWD powder    levothyroxine (SYNTHROID, LEVOTHROID) 100 mcg tablet    umeclidinium-vilanterol (ANORO ELLIPTA) 62.5-25 MCG/INH inhaler    cholecalciferol (VITAMIN D) 1,000 unit tablet    ascorbic acid (VITAMIN C) 100 MG tablet    Cyanocobalamin (VITAMIN B12) 1000 MCG TBCR    Multiple Vitamins-Minerals (CENTRUM SILVER 50+WOMEN) TABS    traZODone (DESYREL) 50 mg tablet    naloxone (NARCAN) 4 mg/0.1 mL  nasal spray    omeprazole (PRILOSEC OTC) 20 MG tablet    dronabinol (MARINOL) 2.5 MG capsule    COPAXONE 40 MG/ML prefilled syringe    amLODIPine-olmesartan (AZOR) 5-20 MG per tablet     No current facility-administered medications for this visit.       Past Medical History:   Diagnosis Date    Arthritis     Asthma     Chronic hypokalemia     Chronic low back pain     Depression     DVT (deep venous  thrombosis)     Dysphagia     GERD (gastroesophageal reflux disease)     Heart disease     Hypercholesterolemia     Hypertension     Hypothyroidism     Multiple sclerosis     Multiple sclerosis, primary progressive     Osteoarthritis     Tobacco abuse      Social History     Socioeconomic History    Marital status: Married   Tobacco Use    Smoking status: Every Day     Packs/day: 1.00     Years: 30.00     Pack years: 30.00     Types: Cigarettes     Start date: 04/08/1980    Smokeless tobacco: Never   Substance and Sexual Activity    Alcohol use: Yes     Alcohol/week: 6.0 standard drinks     Types: 6 Standard drinks or equivalent per week    Drug use: Never    Sexual activity: Not Currently     Partners: Male     Birth control/protection: Post-menopausal         Vitals:    06/08/21 1122   BP: (!) 156/98   Pulse: 85   Temp: 36.2 C (97.2 F)   Weight: 82 kg (180 lb 11.2 oz)   Height: 1.511 m (4' 11.5")     Body mass index is 35.89 kg/m.    Physical Exam   On examination, blood pressure is 156/98 with a repeat blood pressure of 160/100.  Her pulse is 85 and she is afebrile with an O2 sat of 35% on room air.  Head and neck examination is unremarkable.  Chest shows scattered rhonchi but no wheezes and her heart exam is regular in rate and rhythm.  Her abdomen is soft without tenderness, rebound or guarding and extremity shows no edema.  She uses a seated rolling walker for ambulatory purposes.  She has a subjective weakness of the left lower extremities and has a decreased dorsiflexion of the left foot.    Assessment/Plan    1. HBP (high blood pressure)  The patient clearly has elevated blood pressure on today's visit.  We discussed this in great detail and felt that she would benefit from antihypertensive medicines.  I will start her amlodipine/olmesartan 5/20 daily.    2. Hypothyroidism, unspecified type  She has stable hypothyroidism we will continue levothyroxine 100 mcg daily.    3. Multiple  sclerosis  She has MS and currently takes Copaxone on a daily basis.  This will be continued.    4. Dyslipidemia  She has a history of dyslipidemia and takes atorvastatin 40 mg daily.  This medication will be continued.    5. Lower extremity pain, diffuse, left  Complains of lower extremity pain and some subtle neurological findings affecting the left dorsal flexion.  Recommend to continue oxycodone, gabapentin and acetaminophen as she is currently taking.  I  do agree MRI of the affected area is needed.  I will review once completed.    6. Chronic obstructive pulmonary disease, unspecified COPD type  She has suspected COPD from years of tobacco abuse and once again was urged to quit smoking.  She will continue Anoro and albuterol as currently prescribed.      No follow-ups on file.

## 2021-06-11 ENCOUNTER — Encounter: Payer: Self-pay | Admitting: Gastroenterology

## 2021-06-30 ENCOUNTER — Other Ambulatory Visit: Payer: Self-pay | Admitting: Primary Care

## 2021-06-30 NOTE — Telephone Encounter (Signed)
Oxycodone 10mg   [facility]

## 2021-06-30 NOTE — Telephone Encounter (Signed)
Filled last on 06/08/2021 for 18 day supply

## 2021-07-01 ENCOUNTER — Encounter: Payer: Self-pay | Admitting: Primary Care

## 2021-07-02 MED ORDER — OXYCODONE HCL 10 MG PO TABS *I*
10.0000 mg | ORAL_TABLET | Freq: Four times a day (QID) | ORAL | 0 refills | Status: DC | PRN
Start: 2021-07-02 — End: 2021-07-23

## 2021-07-02 NOTE — Telephone Encounter (Signed)
She sent another message requesting this to be filled.

## 2021-07-22 ENCOUNTER — Other Ambulatory Visit: Payer: Self-pay | Admitting: Primary Care

## 2021-07-22 NOTE — Telephone Encounter (Signed)
Julia Burgess was last seen: 06/08/21  Next scheduled office visit: Visit date not found

## 2021-07-23 ENCOUNTER — Other Ambulatory Visit: Payer: Self-pay | Admitting: Primary Care

## 2021-07-23 MED ORDER — OXYCODONE HCL 10 MG PO TABS *I*
10.0000 mg | ORAL_TABLET | Freq: Four times a day (QID) | ORAL | 0 refills | Status: DC | PRN
Start: 2021-07-23 — End: 2021-08-23

## 2021-07-23 MED ORDER — LEVOTHYROXINE SODIUM 100 MCG PO TABS *I*
100.0000 ug | ORAL_TABLET | Freq: Every day | ORAL | 1 refills | Status: DC
Start: 2021-07-23 — End: 2022-02-01

## 2021-07-23 NOTE — Telephone Encounter (Signed)
I-Stop completed, last filled: 07/03/21  Last office visit: 06/08/21

## 2021-07-26 ENCOUNTER — Other Ambulatory Visit: Payer: Self-pay | Admitting: Primary Care

## 2021-07-26 NOTE — Telephone Encounter (Signed)
Oxycodone '10mg'$   Would like to go back to 120 quantity  [facility]

## 2021-08-17 ENCOUNTER — Encounter: Payer: Self-pay | Admitting: Gastroenterology

## 2021-08-17 ENCOUNTER — Other Ambulatory Visit: Payer: Self-pay | Admitting: Primary Care

## 2021-08-17 NOTE — Telephone Encounter (Signed)
Julia Burgess was last seen: 06/08/21    Dr Hughes Better

## 2021-08-23 ENCOUNTER — Other Ambulatory Visit: Payer: Self-pay | Admitting: Primary Care

## 2021-08-23 NOTE — Telephone Encounter (Signed)
Oxycodone 10mg  [facility] [place]

## 2021-08-23 NOTE — Telephone Encounter (Signed)
I-Stop completed, last filled: 07/28/21  Last office visit: 06/08/21

## 2021-08-24 MED ORDER — OXYCODONE HCL 10 MG PO TABS *I*
10.0000 mg | ORAL_TABLET | Freq: Four times a day (QID) | ORAL | 0 refills | Status: DC | PRN
Start: 2021-08-24 — End: 2021-09-20

## 2021-09-20 ENCOUNTER — Other Ambulatory Visit: Payer: Self-pay | Admitting: Primary Care

## 2021-09-20 MED ORDER — OXYCODONE HCL 10 MG PO TABS *I*
10.0000 mg | ORAL_TABLET | Freq: Four times a day (QID) | ORAL | 0 refills | Status: DC | PRN
Start: 2021-09-20 — End: 2021-10-18

## 2021-09-21 ENCOUNTER — Encounter: Payer: Self-pay | Admitting: Gastroenterology

## 2021-10-18 ENCOUNTER — Other Ambulatory Visit: Payer: Self-pay | Admitting: Primary Care

## 2021-10-19 MED ORDER — OXYCODONE HCL 10 MG PO TABS *I*
10.0000 mg | ORAL_TABLET | Freq: Four times a day (QID) | ORAL | 0 refills | Status: DC | PRN
Start: 2021-10-19 — End: 2021-11-23

## 2021-10-19 NOTE — Telephone Encounter (Signed)
I-Stop completed, last filled: 09/25/21  Last office visit: 06/08/21    Dr Myrtie Neither

## 2021-10-26 ENCOUNTER — Other Ambulatory Visit: Payer: Self-pay | Admitting: Primary Care

## 2021-10-26 NOTE — Telephone Encounter (Signed)
Julia Burgess was last seen: 06/08/21

## 2021-10-27 NOTE — Telephone Encounter (Signed)
Sent a mychart message to schedule an appt to review medications

## 2021-10-27 NOTE — Telephone Encounter (Signed)
Pt Needs OV

## 2021-10-29 ENCOUNTER — Encounter: Payer: Self-pay | Admitting: Gastroenterology

## 2021-11-05 ENCOUNTER — Other Ambulatory Visit: Payer: Self-pay | Admitting: Physician Assistant

## 2021-11-05 LAB — CBC AND DIFFERENTIAL
Baso # K/uL: 0.1 10*3/uL (ref 0.0–0.2)
Basophil %: 1 % (ref 0–2)
Eos # K/uL: 0.3 10*3/uL (ref 0.0–0.7)
Eosinophil %: 4 % (ref 0–8)
Hematocrit: 41 % (ref 37.5–47.7)
Hemoglobin: 13.3 g/dL (ref 12.1–15.8)
Immature Granulocytes Absolute: 0.02 10*3/uL (ref 0.0–0.031)
Immature Granulocytes: 0.3 % (ref 0.0–0.4)
Lymph # K/uL: 2.1 10*3/uL (ref 1.0–5.2)
Lymphocyte %: 33 % (ref 13–42)
MCH: 31.6 pg (ref 26.4–33.6)
MCHC: 32.4 g/dL (ref 31.9–37.3)
MCV: 97 fL — ABNORMAL HIGH (ref 80–93)
Mean Platelet Volume: 9 fL — ABNORMAL LOW (ref 10.1–10.4)
Mono # K/uL: 0.5 10*3/uL (ref 0.3–1.1)
Monocyte %: 8 % (ref 2–12)
Neut # K/uL: 3.4 10*3/uL (ref 2.1–8.0)
Nucl RBC # K/uL: 0 10*3/uL (ref 0.0–0.012)
Nucl RBC %: 0 % (ref 0.0–0.2)
Platelets: 389 10*3 — ABNORMAL HIGH (ref 144–366)
RBC Distribution Width-SD: 49.3 fL — ABNORMAL HIGH (ref 41.0–45.3)
RBC: 4.21 X10 6 (ref 4.16–5.34)
RDW: 13.8 % (ref 12.8–14.2)
Seg Neut %: 53 % (ref 44–79)
WBC: 6.4 10*3 (ref 4.3–11.0)

## 2021-11-05 LAB — BASIC METABOLIC PANEL
Anion Gap: 15
CO2: 24 MMOL/L (ref 22–29)
Calcium: 9.6 MG/DL (ref 8.6–10.2)
Chloride: 98 MMOL/L (ref 98–107)
Creatinine: 0.7 MG/DL (ref 0.5–0.9)
Glucose: 108 MG/DL — ABNORMAL HIGH (ref 70–100)
Lab: 7 MG/DL — ABNORMAL LOW (ref 8–23)
Potassium: 4.7 MMOL/L (ref 3.5–5.1)
Sodium: 137 MMOL/L (ref 136–145)
eGFR BY CREAT: 99

## 2021-11-05 LAB — HEPATIC FUNCTION PANEL
ALT: 61 U/L — ABNORMAL HIGH (ref 0–33)
AST: 41 U/L — ABNORMAL HIGH (ref 0–32)
Albumin: 5.3 GM/DL — ABNORMAL HIGH (ref 3.5–5.2)
Alk Phos: 164 U/L — ABNORMAL HIGH (ref 35–104)
Bilirubin,Direct: 0.1 MG/DL (ref 0.0–0.3)
Bilirubin,Total: <0.3 MG/DL (ref 0.0–1.0)
Total Protein: 6.9 GM/DL (ref 6.6–8.7)

## 2021-11-05 LAB — MAGNESIUM: Magnesium: 1.8 MG/DL (ref 1.7–2.6)

## 2021-11-15 ENCOUNTER — Telehealth: Payer: Self-pay | Admitting: Primary Care
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
## 2021-11-17 ENCOUNTER — Other Ambulatory Visit: Payer: Self-pay | Admitting: Primary Care

## 2021-11-17 NOTE — Telephone Encounter (Signed)
Julia Burgess was last seen: 06/08/21  Next scheduled office visit: Visit date not found

## 2021-11-17 NOTE — Telephone Encounter (Signed)
Sorry, I cannot do this.  She is responsible for the safety of her pain medication.

## 2021-11-18 NOTE — Telephone Encounter (Signed)
Left a message for Julia Burgess

## 2021-11-19 MED ORDER — ALBUTEROL SULFATE HFA 108 (90 BASE) MCG/ACT IN AERS *I*
1.0000 | INHALATION_SPRAY | Freq: Four times a day (QID) | RESPIRATORY_TRACT | 3 refills | Status: DC | PRN
Start: 2021-11-19 — End: 2022-03-04

## 2021-11-19 NOTE — Telephone Encounter (Signed)
Patient called back and scheduled an appt. Can you please refill until she is seen on 11/23/21

## 2021-11-23 ENCOUNTER — Ambulatory Visit: Payer: Medicare (Managed Care) | Attending: Primary Care | Admitting: Primary Care

## 2021-11-23 ENCOUNTER — Encounter: Payer: Self-pay | Admitting: Primary Care

## 2021-11-23 ENCOUNTER — Other Ambulatory Visit: Payer: Self-pay

## 2021-11-23 VITALS — BP 124/74 | HR 71 | Temp 97.6°F | Ht 59.5 in | Wt 187.6 lb

## 2021-11-23 DIAGNOSIS — Z79899 Other long term (current) drug therapy: Secondary | ICD-10-CM | POA: Insufficient documentation

## 2021-11-23 DIAGNOSIS — G35 Multiple sclerosis: Secondary | ICD-10-CM

## 2021-11-23 DIAGNOSIS — Z0000 Encounter for general adult medical examination without abnormal findings: Secondary | ICD-10-CM | POA: Insufficient documentation

## 2021-11-23 DIAGNOSIS — E785 Hyperlipidemia, unspecified: Secondary | ICD-10-CM | POA: Insufficient documentation

## 2021-11-23 DIAGNOSIS — M545 Low back pain, unspecified: Secondary | ICD-10-CM | POA: Insufficient documentation

## 2021-11-23 DIAGNOSIS — J449 Chronic obstructive pulmonary disease, unspecified: Secondary | ICD-10-CM | POA: Insufficient documentation

## 2021-11-23 DIAGNOSIS — E039 Hypothyroidism, unspecified: Secondary | ICD-10-CM | POA: Insufficient documentation

## 2021-11-23 DIAGNOSIS — I10 Essential (primary) hypertension: Secondary | ICD-10-CM | POA: Insufficient documentation

## 2021-11-23 DIAGNOSIS — Z23 Encounter for immunization: Secondary | ICD-10-CM

## 2021-11-23 LAB — PAIN CLINIC PROFILE
Amphetamine,UR: NEGATIVE
Benzodiazepinen,UR: POSITIVE
Cocaine/Metab,UR: NEGATIVE
Opiates,UR: NEGATIVE
Oxycodone/Oxymorphone,UR: NEGATIVE
THC Metabolite,UR: POSITIVE

## 2021-11-23 MED ORDER — OXYCODONE HCL 10 MG PO TABS *I*
10.0000 mg | ORAL_TABLET | Freq: Four times a day (QID) | ORAL | 0 refills | Status: DC | PRN
Start: 2021-11-23 — End: 2021-12-20
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
## 2021-11-23 NOTE — Patient Instructions (Signed)
Thank you for completing your Follow-up (6 month check up ), Subsequent Annual Medicare Visit, Colon Cancer Screening (due), Other (Lung cancer screening due ), and Medication Refill (Oxycodone due, I stop- 10/25/21 last fill )   with [place] today.     The purpose of this visits was to:     Screen for disease   Assess risk of future medical problems   Help develop a healthy lifestyle   Update vaccines   Get to know your doctor in case of an illness    Patient Care Team:  Viona Gilmore, MD as PCP - General (Primary Care)  Pablo Lawrence, MD (Neurology)     Medicare 5 Year Plan    The following items were identified as areas of concern during your screening today:  Smoking- This is a risk factor for many kids of Cancer, Heart Attack, Stroke, Kidney Problems, Eye Problems, Asthma, COPD, and can cause an overall decrease in energy.   BMI greater than 25 - This is a risk for Heart Attack, Stroke, High Blood Pressure, Diabetes, High Cholesterol and other complications.       The Health Maintenance table below identifies screening tests and immunizations recommended by your health care team:  Health Maintenance: These screening recommendations are based on USPSTF, [employer], and [place] state guidelines   Topic Date Due   . COVID-19 Vaccine (1) Never done   . HIV Screening  Never done   . DTaP/Tdap/Td Vaccines (1 - Tdap) Never done   . Cervical Cancer Screening   Never done   . Colon Cancer Screening  Never done   . Shingles Vaccine (1 of 2) Never done   . Lung Cancer Screening  Never done   . Pneumococcal Vaccination (2 - PCV) 02/04/2016   . Flu Shot (1) 01/07/2022   . Breast Cancer Screening  02/26/2022   . DEPRESSION SCREEN YEARLY  11/24/2022   . Osteoporosis Screening  03/26/2025   . Hepatitis C Screening  Completed     In addition, goals and orders placed to address these recommendations are listed in the "Today's Visit" section.    We wish you the best of health and look forward to seeing you again next year  for your Annual Medicare Wellness Visit.     If you have any health care concerns before then, please do not hesitate to contact us.

## 2021-11-24 LAB — THC SEMI-QUANT, URINE
Creatinine,UR: 20 mg/dL (ref 20–300)
Semi-Quant THC,UR: 73 ng/mL
THC/Creat Ratio: 365 ng/mg

## 2021-11-25 ENCOUNTER — Telehealth: Payer: Self-pay | Admitting: Primary Care

## 2021-11-25 LAB — BENZODIAZEPINE, CONFIRMATION, URINE: Confirm BZD: POSITIVE

## 2021-11-25 LAB — CONFIRM THC METABOLITE, URINE: Confirm THC Metab: POSITIVE

## 2021-11-25 NOTE — Telephone Encounter (Signed)
General    Symptoms: sore throat, tongue is white, corners of mouth are sore      When did symptoms start? Wed, 11/24/21    Did you try anything to lessen your symptoms?  No   If yes What?  When?  Did it help?     She thinks she has thrush from the prevnar injection.  She is not taking any antibiotics.

## 2021-11-25 NOTE — Telephone Encounter (Signed)
Patient called again and would like to know if any of the providers can help with this?

## 2021-11-30 ENCOUNTER — Other Ambulatory Visit: Payer: Self-pay | Admitting: Gastroenterology

## 2021-11-30 ENCOUNTER — Encounter: Payer: Self-pay | Admitting: Gastroenterology

## 2021-11-30 LAB — HM COLONOSCOPY

## 2021-12-01 ENCOUNTER — Telehealth: Payer: Self-pay | Admitting: Pulmonology

## 2021-12-01 ENCOUNTER — Ambulatory Visit: Payer: Medicare (Managed Care) | Admitting: Pulmonology

## 2021-12-01 ENCOUNTER — Other Ambulatory Visit: Payer: Self-pay | Admitting: Primary Care

## 2021-12-01 NOTE — Telephone Encounter (Signed)
TC to patient for 9:30 [facility] intake visit. No answer. Left VM with clinic number to reschedule.     Kastin Cerda M Pluma Diniz, NP

## 2021-12-03 LAB — SURGICAL PATHOLOGY

## 2021-12-08 ENCOUNTER — Encounter: Payer: Self-pay | Admitting: Pulmonology

## 2021-12-08 ENCOUNTER — Ambulatory Visit: Payer: Medicare (Managed Care) | Admitting: Pulmonology

## 2021-12-08 DIAGNOSIS — F17210 Nicotine dependence, cigarettes, uncomplicated: Secondary | ICD-10-CM

## 2021-12-08 DIAGNOSIS — Z122 Encounter for screening for malignant neoplasm of respiratory organs: Secondary | ICD-10-CM

## 2021-12-08 NOTE — Progress Notes (Signed)
[facility]      Patient encounter was performed via telemedicine.      [x]  Real time audio   []   Store and Forward      Patient located at Home  Provider located at:   []  Home office  [x]  Clinical office   []   Administrative office           Dear Dr. Asa Saunas,    Julia Burgess was seen today for lung cancer screening. Julia Burgess is 60 y.o.  Current smoker. Julia Burgess reports that she has smoked an average of 1ppd for 35 years.     Asbestos/Other environmental exposures: Unsure   Family History of Lung Ca: No   Signs/Symptoms of Lung Cancer: No    Medical History          Chronic hypokalemia    Hypertension    Osteoarthritis    Hypercholesterolemia    Chronic low back pain    Tobacco abuse    Multiple sclerosis    Depression    Asthma    Multiple sclerosis, primary progressive    Dysphagia    Arthritis    DVT (deep venous thrombosis)    GERD (gastroesophageal reflux disease)    Heart disease    Hypothyroidism        There were no vitals filed for this visit.    Imaging since last visit:  No recent imaging to review     Screening assessment tool:  Using the shared-decision web-based tool shouldiscreen.com, the patient confers a 2.6% chance of developing lung cancer in the next six years.    Impression: Julia Burgess was seen today for lung cancer screening. Education and counseling on lung cancer screening was performed.  The risks/benefits of screening were discussed. We spent 3 minutes discussing options for smoking cessation including [facility] and [facility]. She has tried patches & gum in the past. She is interested in working with [employer] tobacco treatment team. Joint decision making occurred using available resources. This meets criteria to justify G0296.    Plan:  Smoking Cessation - Referral placed to [facility]   CT Scan ordered  Follow-up based on results        Sincerely,     Tiajuana Amass, NP

## 2021-12-08 NOTE — Patient Instructions (Signed)
[email or web address]

## 2021-12-20 ENCOUNTER — Ambulatory Visit
Admission: RE | Admit: 2021-12-20 | Discharge: 2021-12-20 | Disposition: A | Discharge status: Home / Self Care | Payer: Medicare (Managed Care) | Source: Ambulatory Visit | Attending: Pulmonology | Admitting: Pulmonology

## 2021-12-20 ENCOUNTER — Other Ambulatory Visit: Payer: Self-pay | Admitting: Primary Care

## 2021-12-20 ENCOUNTER — Other Ambulatory Visit: Payer: Self-pay

## 2021-12-20 DIAGNOSIS — F17210 Nicotine dependence, cigarettes, uncomplicated: Secondary | ICD-10-CM | POA: Insufficient documentation

## 2021-12-20 DIAGNOSIS — R918 Other nonspecific abnormal finding of lung field: Secondary | ICD-10-CM

## 2021-12-20 DIAGNOSIS — Z122 Encounter for screening for malignant neoplasm of respiratory organs: Secondary | ICD-10-CM | POA: Insufficient documentation

## 2021-12-20 MED ORDER — OXYCODONE HCL 10 MG PO TABS *I*
10.0000 mg | ORAL_TABLET | Freq: Four times a day (QID) | ORAL | 0 refills | Status: DC | PRN
Start: 2021-12-20 — End: 2021-12-27

## 2021-12-20 NOTE — Telephone Encounter (Signed)
Oxycodone 10mg   [facility]

## 2021-12-20 NOTE — Telephone Encounter (Signed)
I-Stop completed, last filled: 11/24/21  Last office visit: 11/23/21  Scheduled office visit: 02/22/2022     Dr. Abbey Chatters patient

## 2021-12-24 ENCOUNTER — Encounter: Payer: Self-pay | Admitting: Pulmonology

## 2021-12-24 ENCOUNTER — Other Ambulatory Visit: Payer: Self-pay | Admitting: Pulmonology

## 2021-12-24 DIAGNOSIS — F17210 Nicotine dependence, cigarettes, uncomplicated: Secondary | ICD-10-CM

## 2021-12-24 DIAGNOSIS — Z122 Encounter for screening for malignant neoplasm of respiratory organs: Secondary | ICD-10-CM

## 2021-12-27 ENCOUNTER — Other Ambulatory Visit: Payer: Self-pay | Admitting: Primary Care

## 2021-12-27 NOTE — Telephone Encounter (Signed)
Medication: oxyCODONE (ROXICODONE) 10 mg immediate release tablet    Send to:   [facility] [place], [place] - [address]  [address] [place] [address]  Phone: [phone] Fax: [phone]  , please change if needed    Additional message: Make sure it is a 30 day supply    Last office visit:   11/23/2021  Last telemedicine visit:  Visit date not found  Last PA office visit:  Visit date not found  Patients upcoming appointments:  Future Appointments   Date Time Provider Department Center   02/22/2022 10:00 AM Viona Gilmore, MD [facility] None     Recent Lab results:  GENERAL CHEMISTRY   Recent Labs     11/05/21  1156 04/28/21  1347   NA 137 139   K 4.7 4.5   CL 98 102   CO2 24 23   GAP 15 14   UN 7* 7   CREAT 0.7 0.60   GLU 108* 100*   CA 9.6 9.5      LIPID PROFILE   Recent Labs     04/28/21  1347   CHOL 280*   TRIG 446*   HDL 50   LDLC see below      LIVER PROFILE   Recent Labs     11/05/21  1156 04/28/21  1347   ALT 61* 18   AST 41* 17   ALK 164* 125*   TB <0.3 0.3      DIABETES THYROID   Recent Labs     04/28/21  1347   HA1C 5.6    Recent Labs     04/28/21  1347   TSH 0.86         Pending/Orders Labs:  Lab Frequency Next Occurrence   Lipid Panel (Reflex to Direct  LDL if Triglycerides more than 400) Once 11/23/2021   Comprehensive metabolic panel Once 11/23/2021   CBC Once 11/23/2021   TSH Once 11/23/2021   Hemoglobin A1c Once 11/23/2021   Urinalysis with reflex to culture Once 11/23/2021   CT LUNG SCREEN - ANNUAL -LungRADS 1 or 2 Once 12/21/2022

## 2021-12-28 ENCOUNTER — Encounter: Payer: Self-pay | Admitting: Gastroenterology

## 2021-12-28 NOTE — Telephone Encounter (Signed)
I-Stop completed, last filled: 12/24/21  Last office visit: 11/23/21  Scheduled office visit: 02/22/2022

## 2021-12-29 MED ORDER — OXYCODONE HCL 10 MG PO TABS *I*
10.0000 mg | ORAL_TABLET | Freq: Four times a day (QID) | ORAL | 0 refills | Status: DC | PRN
Start: 2021-12-29 — End: 2022-01-25

## 2022-01-04 ENCOUNTER — Encounter: Payer: Self-pay | Admitting: Gastroenterology

## 2022-01-25 ENCOUNTER — Other Ambulatory Visit: Payer: Self-pay

## 2022-01-25 NOTE — Telephone Encounter (Signed)
I-Stop completed, last filled: 12/31/21  Last office visit: 11/23/21  Scheduled office visit: 02/22/2022

## 2022-01-25 NOTE — Telephone Encounter (Signed)
Oxycodone 10mg  [facility]

## 2022-01-26 MED ORDER — OXYCODONE HCL 10 MG PO TABS *I*
10.0000 mg | ORAL_TABLET | Freq: Four times a day (QID) | ORAL | 0 refills | Status: DC | PRN
Start: 2022-01-26 — End: 2022-02-25

## 2022-01-31 ENCOUNTER — Other Ambulatory Visit: Payer: Self-pay | Admitting: Primary Care

## 2022-02-01 ENCOUNTER — Other Ambulatory Visit: Payer: Self-pay | Admitting: Primary Care

## 2022-02-01 NOTE — Telephone Encounter (Signed)
11/23/2021  Next 02/22/2022

## 2022-02-01 NOTE — Telephone Encounter (Signed)
11/23/2021  Next 02/22/2022

## 2022-02-03 ENCOUNTER — Other Ambulatory Visit: Payer: Self-pay | Admitting: Internal Medicine

## 2022-02-03 LAB — COMPREHENSIVE METABOLIC PANEL
ALT: 36 U/L — ABNORMAL HIGH (ref 0–33)
AST: 34 U/L — ABNORMAL HIGH (ref 0–32)
Albumin: 4.7 GM/DL (ref 3.5–5.2)
Alk Phos: 114 U/L — ABNORMAL HIGH (ref 35–104)
Anion Gap: 16
Bilirubin,Total: 0.6 MG/DL (ref 0.0–1.0)
CO2: 25 MMOL/L (ref 22–29)
Calcium: 9.7 MG/DL (ref 8.6–10.2)
Chloride: 82 MMOL/L — ABNORMAL LOW (ref 98–107)
Creatinine: 0.6 MG/DL (ref 0.5–0.9)
Glucose: 132 MG/DL — ABNORMAL HIGH (ref 70–100)
Lab: 10 MG/DL (ref 8–23)
Potassium: 5 MMOL/L (ref 3.5–5.1)
Sodium: 123 MMOL/L — CL (ref 136–145)
Total Protein: 6.9 GM/DL (ref 6.6–8.7)
eGFR BY CREAT: 102

## 2022-02-03 LAB — CBC AND DIFFERENTIAL
Baso # K/uL: 0.1 10*3/uL (ref 0.0–0.2)
Basophil %: 1 % (ref 0–2)
Eos # K/uL: 0.2 10*3/uL (ref 0.0–0.7)
Eosinophil %: 2 % (ref 0–8)
Hematocrit: 40.1 % (ref 37.5–47.7)
Hemoglobin: 13.6 g/dL (ref 12.1–15.8)
Immature Granulocytes Absolute: 0.05 10*3/uL — ABNORMAL HIGH (ref 0.0–0.031)
Immature Granulocytes: 0.6 % — ABNORMAL HIGH (ref 0.0–0.4)
Lymph # K/uL: 1.5 10*3/uL (ref 1.0–5.2)
Lymphocyte %: 18 % (ref 13–42)
MCH: 30.8 pg (ref 26.4–33.6)
MCHC: 33.9 g/dL (ref 31.9–37.3)
MCV: 91 fL (ref 80–93)
Mean Platelet Volume: 8.4 fL — ABNORMAL LOW (ref 10.1–10.4)
Mono # K/uL: 0.7 10*3/uL (ref 0.3–1.1)
Monocyte %: 8 % (ref 2–12)
Neut # K/uL: 5.9 10*3/uL (ref 2.1–8.0)
Nucl RBC # K/uL: 0 10*3/uL (ref 0.0–0.012)
Nucl RBC %: 0 % (ref 0.0–0.2)
Platelets: 359 10*3 (ref 144–366)
RBC Distribution Width-SD: 50.6 fL — ABNORMAL HIGH (ref 41.0–45.3)
RBC: 4.42 X10 6 (ref 4.16–5.34)
RDW: 15.2 % — ABNORMAL HIGH (ref 12.8–14.2)
Seg Neut %: 71 % (ref 44–79)
WBC: 8.4 10*3 (ref 4.3–11.0)

## 2022-02-03 LAB — NT-PRO BNP: NT-pro BNP: <55 PG/ML (ref 0–900)

## 2022-02-03 LAB — LIPID PANEL
Chol/HDL Ratio: 5.03
Cholesterol: 297 MG/DL — ABNORMAL HIGH (ref 107–200)
HDL: 59 MG/DL (ref 35–86)
LDL Calculated: 185 MG/DL — ABNORMAL HIGH (ref 0–100)
Triglycerides: 264 MG/DL — ABNORMAL HIGH (ref 35–150)

## 2022-02-04 NOTE — Result Encounter Note (Signed)
I spoke to Julia Burgess and discussed her abnormal low sodium levels.  She was advised by cardiology to discontinue hydrochlorothiazide.  She will liberalize her intake of salt.

## 2022-02-08 ENCOUNTER — Other Ambulatory Visit: Payer: Self-pay | Admitting: Family Medicine

## 2022-02-08 LAB — BASIC METABOLIC PANEL
Anion Gap: 8
CO2: 30 MMOL/L — ABNORMAL HIGH (ref 22–29)
Calcium: 9.4 MG/DL (ref 8.6–10.2)
Chloride: 95 MMOL/L — ABNORMAL LOW (ref 98–107)
Creatinine: 0.6 MG/DL (ref 0.5–0.9)
Glucose: 127 MG/DL — ABNORMAL HIGH (ref 70–100)
Lab: 9 MG/DL (ref 8–23)
Potassium: 5.2 MMOL/L — ABNORMAL HIGH (ref 3.5–5.1)
Sodium: 133 MMOL/L — ABNORMAL LOW (ref 136–145)
eGFR BY CREAT: 102

## 2022-02-09 ENCOUNTER — Encounter: Payer: Self-pay | Admitting: Gastroenterology

## 2022-02-14 ENCOUNTER — Telehealth: Payer: Self-pay

## 2022-02-14 NOTE — Telephone Encounter (Signed)
Patient is having a cardiac cath 02/22/2022 and needs a bridge for xarelto

## 2022-02-16 ENCOUNTER — Other Ambulatory Visit: Payer: Self-pay | Admitting: Primary Care

## 2022-02-16 MED ORDER — ENOXAPARIN SODIUM 40 MG/0.4ML IJ SOSY *I*
40.0000 mg | PREFILLED_SYRINGE | Freq: Every day | INTRAMUSCULAR | 0 refills | Status: DC
Start: 2022-02-16 — End: 2022-05-03

## 2022-02-16 NOTE — Telephone Encounter (Signed)
The patient should stop Xarelto 3 days prior to catheterization and start Lovenox daily for 3 days.

## 2022-02-17 NOTE — Telephone Encounter (Signed)
Patient is aware 

## 2022-02-22 ENCOUNTER — Ambulatory Visit: Payer: Medicare (Managed Care) | Admitting: Primary Care

## 2022-02-25 ENCOUNTER — Other Ambulatory Visit: Payer: Self-pay

## 2022-02-25 MED ORDER — OXYCODONE HCL 10 MG PO TABS *I*
10.0000 mg | ORAL_TABLET | Freq: Four times a day (QID) | ORAL | 0 refills | Status: DC | PRN
Start: 2022-02-25 — End: 2022-03-28

## 2022-02-25 NOTE — Telephone Encounter (Signed)
I-Stop completed, last filled: 01/31/22  Last office visit: 11/23/21  Scheduled office visit: 04/13/2022

## 2022-02-25 NOTE — Telephone Encounter (Signed)
Oxycodone 10mg   [facility]

## 2022-03-04 ENCOUNTER — Other Ambulatory Visit: Payer: Self-pay | Admitting: Primary Care

## 2022-03-04 MED ORDER — ALBUTEROL SULFATE HFA 108 (90 BASE) MCG/ACT IN AERS *I*
1.0000 | INHALATION_SPRAY | Freq: Four times a day (QID) | RESPIRATORY_TRACT | 3 refills | Status: DC | PRN
Start: 2022-03-04 — End: 2024-01-02

## 2022-03-04 NOTE — Telephone Encounter (Signed)
Julia Burgess was last seen: 11/23/2021  Next scheduled office visit: 04/13/2022

## 2022-03-10 ENCOUNTER — Encounter: Payer: Self-pay | Admitting: Gastroenterology

## 2022-03-16 ENCOUNTER — Encounter: Payer: Self-pay | Admitting: Gastroenterology

## 2022-03-24 ENCOUNTER — Encounter: Payer: Self-pay | Admitting: Gastroenterology

## 2022-03-28 ENCOUNTER — Other Ambulatory Visit: Payer: Self-pay

## 2022-03-28 MED ORDER — OXYCODONE HCL 10 MG PO TABS *I*
10.0000 mg | ORAL_TABLET | Freq: Four times a day (QID) | ORAL | 0 refills | Status: DC | PRN
Start: 2022-03-28 — End: 2022-03-28

## 2022-03-28 NOTE — Telephone Encounter (Signed)
I-Stop completed, last filled: 03/02/2022  Last office visit: 11/23/2021  Scheduled office visit: 04/13/2022

## 2022-03-28 NOTE — Addendum Note (Signed)
Addended by: Sudie Grumbling on: 03/28/2022 03:20 PM     Modules accepted: Orders

## 2022-03-28 NOTE — Telephone Encounter (Signed)
Oxycodone 10mg   [facility]

## 2022-03-28 NOTE — Telephone Encounter (Signed)
Patient called and stated they do not have this medication at [facility] and would like to know if this can please be sent to [facility] in [facility]

## 2022-03-29 MED ORDER — OXYCODONE HCL 10 MG PO TABS *I*
10.0000 mg | ORAL_TABLET | Freq: Four times a day (QID) | ORAL | 0 refills | Status: DC | PRN
Start: 2022-03-29 — End: 2022-04-26

## 2022-03-29 NOTE — Telephone Encounter (Signed)
Patient called and would like to know if this can be addressed today please as she is almost out of this medicaiton

## 2022-04-08 ENCOUNTER — Ambulatory Visit: Payer: Medicare (Managed Care) | Admitting: Otolaryngology

## 2022-04-08 ENCOUNTER — Other Ambulatory Visit: Payer: Self-pay

## 2022-04-08 ENCOUNTER — Encounter: Payer: Self-pay | Admitting: Otolaryngology

## 2022-04-08 VITALS — BP 132/76 | Temp 97.6°F | Ht 59.5 in | Wt 174.0 lb

## 2022-04-08 DIAGNOSIS — R5383 Other fatigue: Secondary | ICD-10-CM

## 2022-04-08 DIAGNOSIS — G479 Sleep disorder, unspecified: Secondary | ICD-10-CM

## 2022-04-08 DIAGNOSIS — R0683 Snoring: Secondary | ICD-10-CM
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
## 2022-04-11 ENCOUNTER — Telehealth: Payer: Self-pay

## 2022-04-11 NOTE — Telephone Encounter (Signed)
Please call patient to schedule her for home sleep study per Dr. Michelene Gardener order [id number].  No prior authorization was required. Please notify Dr. Michelene Gardener office when the test is scheduled.

## 2022-04-11 NOTE — Progress Notes (Signed)
Telephone notification was sent to [employer], Karren Burly and Carson Myrtle at [facility] to contact patient to schedule a HST( order [id number]).  No prior authorization is required per [employer].  Requested sleep lab to call our office once the patient has been scheduled.

## 2022-04-12 ENCOUNTER — Telehealth: Payer: Self-pay

## 2022-04-12 NOTE — Telephone Encounter (Signed)
Direct HST order from [facility] ready for review in Epic     NPA required

## 2022-04-13 ENCOUNTER — Encounter: Payer: Self-pay | Admitting: Primary Care

## 2022-04-13 ENCOUNTER — Telehealth: Payer: Self-pay

## 2022-04-13 ENCOUNTER — Ambulatory Visit: Payer: Medicare (Managed Care) | Attending: Primary Care | Admitting: Primary Care

## 2022-04-13 ENCOUNTER — Other Ambulatory Visit: Payer: Self-pay

## 2022-04-13 VITALS — BP 110/68 | HR 76 | Temp 96.9°F | Ht 59.5 in | Wt 184.9 lb

## 2022-04-13 DIAGNOSIS — J449 Chronic obstructive pulmonary disease, unspecified: Secondary | ICD-10-CM

## 2022-04-13 DIAGNOSIS — E785 Hyperlipidemia, unspecified: Secondary | ICD-10-CM | POA: Insufficient documentation

## 2022-04-13 DIAGNOSIS — Z79899 Other long term (current) drug therapy: Secondary | ICD-10-CM

## 2022-04-13 DIAGNOSIS — N390 Urinary tract infection, site not specified: Secondary | ICD-10-CM

## 2022-04-13 DIAGNOSIS — I10 Essential (primary) hypertension: Secondary | ICD-10-CM

## 2022-04-13 DIAGNOSIS — K140 Glossitis: Secondary | ICD-10-CM

## 2022-04-13 DIAGNOSIS — G35 Multiple sclerosis: Secondary | ICD-10-CM

## 2022-04-13 DIAGNOSIS — M545 Low back pain, unspecified: Secondary | ICD-10-CM | POA: Insufficient documentation

## 2022-04-13 DIAGNOSIS — E039 Hypothyroidism, unspecified: Secondary | ICD-10-CM

## 2022-04-13 LAB — URINE MICROSCOPIC (IQ200)

## 2022-04-13 LAB — URINALYSIS WITH REFLEX TO CULTURE
Blood,UA: NEGATIVE
Ketones, UA: NEGATIVE
Nitrite,UA: NEGATIVE
Specific Gravity,UA: 1.024 (ref 1.002–1.030)
pH,UA: 7.5 (ref 5.0–8.0)

## 2022-04-13 LAB — RESOLUTION

## 2022-04-13 NOTE — Progress Notes (Signed)
Julia Burgess is a 61 y.o. female ([birth year])    Nurses note:   Follow-up (5 month check up )      CC: This patient presents today for a follow-up evaluation.  Has multiple complaints.    HPI: Julia Burgess is a 62 year old female patient with a longstanding history of MS, neurogenic bladder, hypertension, dyslipidemia, diabetes mellitus, chronic tobacco abuse, lumbar disc disease, lumbar spine stenosis status post low back surgery, osteoporosis, osteoarthritis of the hip status post left sided hip replacement followed by revision surgery, and many other medical issues.  Currently on Jardiance, olmesartan, Repatha, oxybutynin, baclofen, oxycodone, Lovenox, levothyroxine, Xarelto, abaloparatide, gabapentin, atorvastatin, Anoro inhaler, vitamin D, vitamin C, vitamin B12, multivitamins, trazodone at bedtime, omeprazole and dronabinol.  Since her previous office visit, the patient was seen by otolaryngology and a tentative diagnosis of obstructive sleep apnea was made.  She was instructed to lose weight and to have an overnight sleep study done.  On today's visit, she denies any chest pain, nausea, vomiting or change in bowel or bladder function.  She continues of headaches, lightheadedness, dizziness, shortness of breath, weakness of the lower extremities, chronic neck and low back pains and worsening incontinence.  She has some noted weight gain since her previous office visit.    ROS  As per HPI    Allergies   Allergen Reactions    Bee Venom Anaphylaxis    Copaxone [Glatiramer Acetate] Anaphylaxis    Ketorolac Other (See Comments)     Muscle spasms    Meperidine Nausea And Vomiting and Other (See Comments)    Prochlorperazine Other (See Comments)     myoclonic        Tilactase Other (See Comments)     GI upset     Current Outpatient Medications   Medication    JARDIANCE 10 MG tablet    olmesartan (BENICAR) 20 mg tablet    calcium carbonate 600 MG chewable tablet    evolocumab (REPATHA) 140 mg/mL prefilled syringe     oxybutynin (DITROPAN) 5 mg tablet    baclofen (LIORESAL) 5 mg tablet    oxyCODONE (ROXICODONE) 10 mg immediate release tablet    albuterol HFA (PROVENTIL, VENTOLIN, PROAIR HFA) 108 (90 Base) MCG/ACT inhaler    enoxaparin (LOVENOX) 40 mg/0.69mL injection    levothyroxine (SYNTHROID, LEVOTHROID) 100 mcg tablet    XARELTO 20 MG tablet    abaloparatide (TYMLOS) 3120 MCG/1.56ML injection pen    gabapentin (NEURONTIN) 300 mg capsule    LEVALBUTEROL 45 MCG/ACT inhaler    atorvastatin (LIPITOR) 40 mg tablet    psyllium (METAMUCIL) 28.3 % POWD powder    umeclidinium-vilanterol (ANORO ELLIPTA) 62.5-25 MCG/INH inhaler    cholecalciferol (VITAMIN D) 1,000 unit tablet    ascorbic acid (VITAMIN C) 100 MG tablet    Cyanocobalamin (VITAMIN B12) 1000 MCG TBCR    Multiple Vitamins-Minerals (CENTRUM SILVER 50+WOMEN) TABS    traZODone (DESYREL) 50 mg tablet    naloxone (NARCAN) 4 mg/0.1 mL nasal spray    omeprazole (PRILOSEC OTC) 20 MG tablet    dronabinol (MARINOL) 2.5 MG capsule    nitrofurantoin mono/macro (MACROBID) 100 mg capsule     No current facility-administered medications for this visit.       Past Medical History:   Diagnosis Date    Arthritis     Asthma     Chronic hypokalemia     Chronic low back pain     Depression     DVT (deep venous thrombosis)  Dysphagia     GERD (gastroesophageal reflux disease)     Heart disease     Hypercholesterolemia     Hypertension     Hypothyroidism     Multiple sclerosis     Multiple sclerosis, primary progressive     Osteoarthritis     Tobacco abuse      Social History     Socioeconomic History    Marital status: Married   Tobacco Use    Smoking status: Every Day     Packs/day: 1.00     Years: 35.00     Additional pack years: 0.00     Total pack years: 35.00     Types: Cigarettes     Start date: 04/08/1980     Passive exposure: Current    Smokeless tobacco: Never   Substance and Sexual Activity    Alcohol use: Yes     Alcohol/week: 2.0 standard drinks of alcohol     Types: 2 Standard  drinks or equivalent per week    Drug use: Never    Sexual activity: Not Currently     Partners: Male     Birth control/protection: Post-menopausal         Vitals:    04/13/22 1111   BP: 110/68   Pulse: 76   Temp: 36.1 C (96.9 F)   Weight: 83.9 kg (184 lb 14.4 oz)   Height: 1.511 m (4' 11.5")     Body mass index is 36.72 kg/m.    Physical Exam   On examination, her blood pressure is 110/68 with a pulse of 76 and the patient is afebrile.  Her BMI is 36%.  Head and neck exam shows a shallow ulcer to the left margin of her tongue.  No visible drainage or discharge.  There is no associated thyromegaly or carotid bruit adenopathy.  Chest is is with bilateral rhonchi but no rales or wheezes and heart exam is regular in rate and rhythm.  Her abdomen is soft, slightly obese and nontender without organomegaly, rebound or guarding and her extremity shows no clubbing, cyanosis nor edema.    Assessment/Plan    1. Hypothyroidism, unspecified type  The patient has stable hypothyroidism.  Will continue current dose of levothyroxine.    2. HBP (high blood pressure)  Blood pressure reasonably controlled.  Will continue antihypertensives and encouraged a low-salt diet.  - Comprehensive metabolic panel; Future  - Urinalysis with reflex to culture; Future  - Hemoglobin A1c; Future    3. Dyslipidemia  History of dyslipidemia.  Currently on Repatha which will be continued lipid profile appears to be acceptable.  She was encouraged on low-fat low-cholesterol diet.  - Lipid Panel (Reflex to Direct  LDL if Triglycerides more than 400); Future  - Hemoglobin A1c; Future    4. Multiple sclerosis  Slowly progressive multiple sclerosis.  Will continue with abaloparatide.  Will follow-up with neurology as scheduled.  - Comprehensive metabolic panel; Future  - CBC; Future    5. Chronic obstructive pulmonary disease, unspecified COPD type  Significant COPD but unfortunately continues to smoke despite repeated warnings to stop smoking.  She  refuses smoking cessation therapy.  She will continue with albuterol and Anoro inhaler.    6. Low back pain  History of chronic low back pain.  She will continue with oral pain medication.    7. Tongue ulcer  Noted to have a tongue ulcer and referred to otolaryngology again for reevaluation.  - AMB REFERRAL TO OTOLARYNGOLOGY  8. Encounter for long-term (current) use of medications  She was asked to have a urine drug screen.  She already has a prescription for Narcan at home.    9. UTI (urinary tract infection)  Concomitant urinary tract infection.  Will be started on nitrofurantoin and urine be sent for culture and sensitivity.  - Urinalysis with reflex to culture      No follow-ups on file.

## 2022-04-13 NOTE — Telephone Encounter (Signed)
This direct referral sleep study is approved.  Joe Lucyann Romano, MD

## 2022-04-13 NOTE — Telephone Encounter (Signed)
Urine was unable to be processed, not enough urine was provided

## 2022-04-14 ENCOUNTER — Telehealth: Payer: Self-pay

## 2022-04-14 NOTE — Telephone Encounter (Signed)
Patient was seen yesterday for uti and would like to know if someone can please send something in for hre today. Dr Asa Saunas was supposed to but did not

## 2022-04-15 LAB — AEROBIC CULTURE

## 2022-04-15 NOTE — Telephone Encounter (Signed)
Patient called and would like to know if an antibiotic can be called in for her UTI. She is in a lot of pain and does not want to wait all weekend

## 2022-04-16 ENCOUNTER — Other Ambulatory Visit: Payer: Self-pay | Admitting: Primary Care

## 2022-04-16 MED ORDER — NITROFURANTOIN MONOHYD MACRO 100 MG PO CAPS *I*
100.0000 mg | ORAL_CAPSULE | Freq: Two times a day (BID) | ORAL | 0 refills | Status: AC
Start: 2022-04-16 — End: 2022-04-23

## 2022-04-16 NOTE — Telephone Encounter (Signed)
Patient with Enterococcus UTI, Called in Macrobid 100 mg BID X 7 days. Advise to repeat UA post antibiotic.

## 2022-04-22 ENCOUNTER — Telehealth: Payer: Self-pay

## 2022-04-22 NOTE — Telephone Encounter (Signed)
Please advise 

## 2022-04-22 NOTE — Telephone Encounter (Signed)
Sore area on tongue is getting worse - she is scheduled for an appointment 05/13/22 - please advise on appointment.

## 2022-04-22 NOTE — Telephone Encounter (Signed)
Referral note printed for review

## 2022-04-22 NOTE — Telephone Encounter (Signed)
Patient seen by Dr Fredrik Cove 04/08/2022 sleep eval, patient has new area under her tongue, PCP referred her for ulcer under tongue and patient was given first available appt 05/13/2022, patient said her PCP said she needed appt ASAP

## 2022-04-25 ENCOUNTER — Telehealth: Payer: Self-pay | Admitting: Primary Care

## 2022-04-25 NOTE — Telephone Encounter (Signed)
Julia Burgess called because she has finished her antibiotics for her UTI. She said that Dr.Persaud wanted her to have another urine test when she was done with her antibiotics. She would like this order to be faxed to [facility] in [place].

## 2022-04-25 NOTE — Telephone Encounter (Signed)
There is an open order in the lab section of her chart.

## 2022-04-25 NOTE — Telephone Encounter (Signed)
Appt made

## 2022-04-25 NOTE — Telephone Encounter (Signed)
It's fine to use an urgent slot this week.

## 2022-04-26 ENCOUNTER — Other Ambulatory Visit: Payer: Self-pay

## 2022-04-26 MED ORDER — OXYCODONE HCL 10 MG PO TABS *I*
10.0000 mg | ORAL_TABLET | Freq: Four times a day (QID) | ORAL | 0 refills | Status: DC | PRN
Start: 2022-04-26 — End: 2022-05-24

## 2022-04-26 NOTE — Telephone Encounter (Signed)
Oxycodone 10mg  [facility] [place]

## 2022-04-26 NOTE — Telephone Encounter (Signed)
Last here 04/13/2022  Next 08/17/2022

## 2022-04-28 ENCOUNTER — Other Ambulatory Visit: Payer: Self-pay | Admitting: Primary Care

## 2022-04-28 ENCOUNTER — Ambulatory Visit: Payer: Medicare (Managed Care) | Admitting: Otolaryngology

## 2022-04-28 ENCOUNTER — Encounter: Payer: Self-pay | Admitting: Otolaryngology

## 2022-04-28 ENCOUNTER — Telehealth: Payer: Self-pay | Admitting: Primary Care

## 2022-04-28 ENCOUNTER — Other Ambulatory Visit: Payer: Self-pay

## 2022-04-28 VITALS — BP 120/65 | Temp 98.7°F | Ht 59.0 in | Wt 178.0 lb

## 2022-04-28 DIAGNOSIS — K148 Other diseases of tongue: Secondary | ICD-10-CM

## 2022-04-28 LAB — URINALYSIS WITH REFLEX TO MICROSCOPIC
Bilirubin,Ur: NEGATIVE
Blood,UA: NEGATIVE
Epithelial Cells: 42 /LPF — ABNORMAL HIGH (ref 0–5)
Glucose, Ur: 150 — ABNORMAL HIGH
Ketones, UA: NEGATIVE
Leuk Esterase,UA: 500 — ABNORMAL HIGH
Nitrite,UA: POSITIVE — ABNORMAL HIGH
Protein,UA: 100 — ABNORMAL HIGH
RBC,UA: 42 /HPF — ABNORMAL HIGH (ref 0–2)
Specific Gravity,UA: 1.02 (ref 1.005–1.025)
Urobilinogen,UA: 0.2 (ref 0.2–1.0)
WBC,UA: >182 /HPF — ABNORMAL HIGH (ref 0–5)
pH,UR: 6 (ref 5.0–8.0)

## 2022-04-28 NOTE — Telephone Encounter (Signed)
Patient called and stated that she saw the oral surgeon and they are going to remove the lesion on her tongue on 05/10/22. She stated she needs to be bridged with Lovenox. She is asking that we send it to [facility]
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
## 2022-04-29 ENCOUNTER — Telehealth: Payer: Self-pay | Admitting: Primary Care

## 2022-04-29 NOTE — Telephone Encounter (Signed)
Can you please keep an eye out for this over the weekend? C&S has not returned yet.

## 2022-04-29 NOTE — Telephone Encounter (Signed)
-----   Message from Viona Gilmore, MD sent at 04/29/2022  8:00 AM EST -----  She has a significant UTI.  Awaiting urine culture and sensitivity.

## 2022-04-29 NOTE — Telephone Encounter (Signed)
She called about this and Talbert Forest let her know that it would be a few days for the culture to come back.

## 2022-04-29 NOTE — Telephone Encounter (Signed)
Need more information as to when she is having surgery.

## 2022-04-30 LAB — MIC

## 2022-04-30 LAB — AEROBIC CULTURE

## 2022-05-03 ENCOUNTER — Other Ambulatory Visit: Payer: Self-pay | Admitting: Primary Care

## 2022-05-03 ENCOUNTER — Other Ambulatory Visit: Payer: Self-pay

## 2022-05-03 MED ORDER — ENOXAPARIN SODIUM 40 MG/0.4ML IJ SOSY *I*
40.0000 mg | PREFILLED_SYRINGE | Freq: Every day | INTRAMUSCULAR | 0 refills | Status: DC
Start: 2022-05-03 — End: 2022-12-28

## 2022-05-03 MED ORDER — ENOXAPARIN SODIUM 40 MG/0.4ML IJ SOSY *I*
40.0000 mg | PREFILLED_SYRINGE | Freq: Every day | INTRAMUSCULAR | 0 refills | Status: DC
Start: 2022-05-03 — End: 2022-05-03

## 2022-05-03 MED ORDER — CIPROFLOXACIN HCL 500 MG PO TABS *I*
500.0000 mg | ORAL_TABLET | Freq: Two times a day (BID) | ORAL | 0 refills | Status: AC
Start: 2022-05-03 — End: 2022-05-13

## 2022-05-03 NOTE — Telephone Encounter (Signed)
Patient is having surgery on 05/12/2022, needs the Lovenox to bridge per note on 04/28/2022 per ENT.

## 2022-05-03 NOTE — Telephone Encounter (Signed)
Spoke to Julia Burgess and advised her to start Cipro 500 mg twice daily for 10 days.  A prescription was sent in her pharmacy.

## 2022-05-03 NOTE — Telephone Encounter (Signed)
Enoxaparin 40mg /0.77ml inj   [facility]

## 2022-05-10 ENCOUNTER — Other Ambulatory Visit: Payer: Self-pay | Admitting: Otolaryngology

## 2022-05-10 ENCOUNTER — Encounter: Payer: Medicare (Managed Care) | Admitting: Otolaryngology

## 2022-05-10 NOTE — Progress Notes (Signed)
Patient will use her oxy 10 postop  MJD      Confidential Drug Report  Search Terms: Jaretssi Brenneke, 1960-10-01  Search Date: 05/10/2022 14:33:35 PM  The Drug Utilization Report below displays all of the controlled substance prescriptions, if any, that your patient has filled in the last twelve months. The information displayed on this report is compiled from pharmacy submissions to the Department, and accurately reflects the information as submitted by the pharmacies.  This report was requested by: Mercy Moore  Reference #: [id number]  Practitioner Count: 1  Pharmacy Count: 2  Current Opioid Prescriptions: 1  Current Benzodiazepine Prescriptions: 0  Current Stimulant Prescriptions: 0        Patient Demographic Information [facility], [place])       [facility] First Name Last Name Birth Date Gender Street Address [place] Zip Code   A Marka Notch August 29, 1960 Female [address] [place] [address]   Leonard Schwartz Hazel Saffel 07/26/1960 Female [address] [place] [address]        Search:  [facility] My Rx Current Rx Drug Type Rx Written Rx Dispensed Drug Quantity Days Supply Prescriber Name Prescriber [facility] # Payment Method Dispenser   B Johny Shears 04/26/2022 04/28/2022 oxycodone hcl (ir) 10 mg tab 120 30 Roselyn Meier MD [id number] [facility]   A Almond Lint 03/29/2022 03/30/2022 oxycodone hcl (ir) 10 mg tab 120 30 Roselyn Meier MD [id number] Medicare [facility]   B N N O 02/25/2022 03/02/2022 oxycodone hcl (ir) 10 mg tab 120 30 Roselyn Meier MD [id number] [facility]   B Almond Lint 01/26/2022 01/31/2022 oxycodone hcl (ir) 10 mg tab 120 30 Roselyn Meier MD [id number] [facility]   B Almond Lint 12/29/2021 12/31/2021 oxycodone hcl (ir) 10 mg tab 120 30 Roselyn Meier MD [id number] [facility]   B Almond Lint 12/20/2021 12/24/2021 oxycodone hcl (ir) 10 mg tab 30 7 Marga Melnick [id number] [facility]   B N N O 11/23/2021 11/24/2021  oxycodone hcl (ir) 10 mg tab 120 30 Roselyn Meier MD [id number] [facility]   B Almond Lint 10/19/2021 10/25/2021 oxycodone hcl (ir) 10 mg tab 120 30 Raina Mina [place] [id number] [facility]   B Almond Lint 09/20/2021 09/25/2021 oxycodone hcl (ir) 10 mg tab 120 30 Roselyn Meier MD [id number] [facility]   B Almond Lint 08/24/2021 08/27/2021 oxycodone hcl (ir) 10 mg tab 120 30 Roselyn Meier MD [id number] [facility]   B Almond Lint 07/23/2021 07/28/2021 oxycodone hcl (ir) 10 mg tab 120 30 Roselyn Meier MD [id number] [facility]   B Almond Lint 07/02/2021 07/03/2021 oxycodone hcl (ir) 10 mg tab 100 25 Roselyn Meier MD [id number] [facility]   B Almond Lint 06/03/2021 06/08/2021 oxycodone hcl (ir) 10 mg tab 72 18 Roselyn Meier MD [id number] [facility] #[facility]   B N N O 06/03/2021 06/03/2021 oxycodone hcl (ir) 10 mg tab 28 7 Roselyn Meier MD [id number] [facility]     Showing 1 to 14 of 14 entries     * - Details of Drug Type : O = Opioid, B = Benzodiazepine, S = Stimulant  * - Drugs marked with an asterisk are compound drugs. If the compound  drug is made up of more than one controlled substance, then each controlled substance will be a separate row in the table.   2024 [facility]

## 2022-05-11 ENCOUNTER — Encounter: Payer: Medicare (Managed Care) | Admitting: Otolaryngology

## 2022-05-11 DIAGNOSIS — K148 Other diseases of tongue: Secondary | ICD-10-CM

## 2022-05-12 ENCOUNTER — Encounter: Payer: Self-pay | Admitting: Gastroenterology

## 2022-05-13 ENCOUNTER — Telehealth: Payer: Self-pay | Admitting: Primary Care

## 2022-05-13 ENCOUNTER — Ambulatory Visit: Payer: Medicare (Managed Care) | Admitting: Otolaryngology

## 2022-05-13 NOTE — Telephone Encounter (Signed)
Yes she can drop off her urine sample to check her urine or she can go to an urgent care over the weekend

## 2022-05-13 NOTE — Telephone Encounter (Signed)
She said she is still having uti symptoms.  She was in on 04/28/22 to see Dr. Asa Saunas and a script was sent in on 05/03/22.  She wants to know if we can send another order for a urine sample to [facility] in [facility] to recheck urine    Phone: [phone]    Fax: [phone]

## 2022-05-14 LAB — SURGICAL PATHOLOGY

## 2022-05-16 ENCOUNTER — Telehealth: Payer: Self-pay

## 2022-05-16 MED ORDER — LIDOCAINE VISCOUS 2 % MT SOLN *I*
30.0000 mL | 1 refills | Status: DC | PRN
Start: 2022-05-16 — End: 2023-06-02

## 2022-05-16 NOTE — Telephone Encounter (Signed)
Patient is calling and asking if you can call her, she still can't eat well, she is in pain , she also sees her results in my chart and is asking if you can review with her please

## 2022-05-16 NOTE — Telephone Encounter (Signed)
Appointment scheduled.

## 2022-05-16 NOTE — Telephone Encounter (Signed)
I spoke with Julia Burgess.  No evidence of the any malignancy or dysplasia on her specimen.  Appears to be a chronic inflammation which she thinks is probably related to that tooth.  She is probably going to get that sanded down.  She will discuss if it needs to be removed, but given her anticoagulation removing a tooth is somewhat complicated.  She is having some persistent pain in the area so I have prescribed some BMX.  I reassured her that this should get better over time.  Routine recheck in 3 months.    Please schedule the patient a routine follow-up with either me or Annice Pih in about 3 months.  Call her with date and time

## 2022-05-18 ENCOUNTER — Telehealth: Payer: Self-pay

## 2022-05-18 DIAGNOSIS — N390 Urinary tract infection, site not specified: Secondary | ICD-10-CM

## 2022-05-18 NOTE — Telephone Encounter (Addendum)
Patient called and stated she was advised to have her urine tested after she had completed her antibiotic. She states she has completed the medication and would like to know if an order can be placed

## 2022-05-19 NOTE — Telephone Encounter (Signed)
Lm to advise patient order was placed

## 2022-05-24 ENCOUNTER — Other Ambulatory Visit: Payer: Self-pay | Admitting: Primary Care

## 2022-05-24 NOTE — Telephone Encounter (Signed)
I-Stop completed, last filled: 04/28/22  Last office visit: 04/13/2022  Scheduled office visit: Future Encounters      08/17/2022 10:00 AM Sch    FOLLOW UP VISIT    PCW    Persaud, [name], MD

## 2022-05-24 NOTE — Telephone Encounter (Signed)
Medication: oxyCODONE (ROXICODONE) 10 mg immediate release tablet     Send to:   [facility], [place] - [address]  [address] [place] [address]  Phone: [phone] Fax: [phone]      , please change if needed    Additional message:     Last office visit:   04/13/2022  Last telemedicine visit:  Visit date not found  Last PA office visit:  Visit date not found  Patients upcoming appointments:  Future Appointments   Date Time Provider Department Center   08/15/2022  1:00 PM Donella Stade, MD ENO None   08/17/2022 10:00 AM Viona Gilmore, MD PCW None     Recent Lab results:  GENERAL CHEMISTRY   Recent Labs     02/08/22  0752 02/03/22  0929 11/05/21  1156   NA 133* 123* 137   K 5.2* 5.0 4.7   CL 95* 82* 98   CO2 30* 25 24   GAP 8 16 15    UN 9 10 7*   CREAT 0.6 0.6 0.7   GLU 127* 132* 108*   CA 9.4 9.7 9.6      LIPID PROFILE   Recent Labs     02/03/22  0929   CHOL 297*   TRIG 264*   HDL 59   LDLC 185*      LIVER PROFILE   Recent Labs     02/03/22  0929 11/05/21  1156   ALT 36* 61*   AST 34* 41*   ALK 114* 164*   TB 0.6 <0.3      DIABETES THYROID   No value within the past 365 days No value within the past 365 days      Pending/Orders Labs:  Lab Frequency Next Occurrence   Lipid Panel (Reflex to Direct  LDL if Triglycerides more than 400) Once 11/23/2021   Comprehensive metabolic panel Once 11/23/2021   CBC Once 11/23/2021   TSH Once 11/23/2021   Hemoglobin A1c Once 11/23/2021   Urinalysis with reflex to culture Once 11/23/2021   CT LUNG SCREEN - ANNUAL -LungRADS 1 or 2 Once 12/21/2022   Sleep Study Once 04/08/2022   Lipid Panel (Reflex to Direct  LDL if Triglycerides more than 400) Once 04/13/2022   Comprehensive metabolic panel Once 04/13/2022   CBC Once 04/13/2022   Urinalysis with reflex to culture Once 04/13/2022   Hemoglobin A1c Once 04/13/2022   Urinalysis with reflex to culture Once 05/18/2022

## 2022-05-26 ENCOUNTER — Other Ambulatory Visit: Payer: Self-pay | Admitting: Primary Care

## 2022-05-26 LAB — HEMOGLOBIN A1C: Hemoglobin A1C: 6.6 %

## 2022-05-26 LAB — CBC
Hematocrit: 44.7 % (ref 37.5–47.7)
Hemoglobin: 14.4 g/dL (ref 12.1–15.8)
MCH: 31 pg (ref 26.4–33.6)
MCHC: 32.2 g/dL (ref 31.9–37.3)
MCV: 96 fL — ABNORMAL HIGH (ref 80–93)
Mean Platelet Volume: 9.2 fL — ABNORMAL LOW (ref 10.1–10.4)
Nucl RBC # K/uL: 0 10*3/uL (ref 0.0–0.012)
Nucl RBC %: 0 % (ref 0.0–0.2)
Platelets: 394 10*3 — ABNORMAL HIGH (ref 144–366)
RBC Distribution Width-SD: 53.4 fL — ABNORMAL HIGH (ref 41.0–45.3)
RBC: 4.65 X10 6 (ref 4.16–5.34)
RDW: 14.9 % — ABNORMAL HIGH (ref 12.8–14.2)
WBC: 9.4 10*3 (ref 4.3–11.0)

## 2022-05-26 LAB — LIPID PANEL
Chol/HDL Ratio: 3.53
Cholesterol: 208 MG/DL — ABNORMAL HIGH (ref 107–200)
HDL: 59 MG/DL (ref 35–86)
LDL Calculated: 91 MG/DL (ref 0–100)
Triglycerides: 288 MG/DL — ABNORMAL HIGH (ref 35–150)

## 2022-05-26 LAB — URINALYSIS WITH REFLEX TO MICROSCOPIC
Bilirubin,Ur: NEGATIVE
Blood,UA: NEGATIVE
Glucose, Ur: NEGATIVE
Ketones, UA: NEGATIVE
Leuk Esterase,UA: NEGATIVE
Nitrite,UA: NEGATIVE
Protein,UA: NEGATIVE
Specific Gravity,UA: 1.013 (ref 1.005–1.025)
Urobilinogen,UA: 0.2 (ref 0.2–1.0)
pH,UR: 6 (ref 5.0–8.0)

## 2022-05-26 LAB — COMPREHENSIVE METABOLIC PANEL
ALT: 27 U/L (ref 0–33)
AST: 24 U/L (ref 0–40)
Albumin: 4.5 GM/DL (ref 3.5–5.2)
Alk Phos: 106 U/L — ABNORMAL HIGH (ref 35–104)
Anion Gap: 14
Bilirubin,Total: 0.3 MG/DL (ref 0.0–1.0)
CO2: 24 MMOL/L (ref 22–29)
Calcium: 10.3 MG/DL (ref 8.6–10.4)
Chloride: 97 MMOL/L — ABNORMAL LOW (ref 98–107)
Creatinine: 0.8 MG/DL (ref 0.5–0.9)
Glucose: 99 MG/DL (ref 70–100)
Lab: 9 MG/DL (ref 8–23)
Potassium: 5.1 MMOL/L (ref 3.5–5.1)
Sodium: 135 MMOL/L — ABNORMAL LOW (ref 136–145)
Total Protein: 6.8 GM/DL (ref 6.6–8.7)
eGFR BY CREAT: 84

## 2022-05-26 NOTE — Telephone Encounter (Signed)
Patient called again about this medication request

## 2022-05-27 MED ORDER — OXYCODONE HCL 10 MG PO TABS *I*
10.0000 mg | ORAL_TABLET | Freq: Four times a day (QID) | ORAL | 0 refills | Status: DC | PRN
Start: 2022-05-27 — End: 2022-06-24

## 2022-05-27 NOTE — Telephone Encounter (Signed)
Patient called again, Please address

## 2022-05-31 ENCOUNTER — Encounter: Payer: Self-pay | Admitting: Gastroenterology

## 2022-06-23 ENCOUNTER — Other Ambulatory Visit: Payer: Self-pay | Admitting: Primary Care

## 2022-06-24 NOTE — Telephone Encounter (Signed)
I-Stop completed, last filled: 05/28/22  Last office visit: 04/13/2022  Scheduled office visit: Future Encounters      08/17/2022 10:00 AM Sch    FOLLOW UP VISIT    PCW    Persaud, [name], MD

## 2022-07-08 NOTE — Progress Notes (Unsigned)
FF [place] STUDY POST TEST QUESTIONNAIRE      Patient Name:        Julia Burgess    MRN #  [id number]      DOB:   [birth year]    What was your approximate bedtime? _________________________       How long did it take you to fall asleep? _________________________      How many times did you get up? _________________________      Approximately what time? / How long were you awake?    _____________/_______________    _____________/_______________    At what time did you get up for the day? __________________________    Any problems or comments?  __________________________  6.  Do you currently use a CPAP machine?   Y  N  7.  Do you use oxygen at night?   Y  N   A)  If yes, did you use it last night?  Y  N  LPM ____  8.  Have you ever had surgery for Sleep Apnea? Y  N   A)  If yes, what was done?  _____________________________________        ___________________________________________________________  9.  Did you wear dental appliance while doing your Home Study?  Y  N                       Please Complete this page  &  Return with Sleep Study Case.Marland KitchenMarland KitchenThank You!                         [facility] SLEEP [facility]ENTER    HOME SLEEP TESTING EQUIPMENT AGREEMENT FORM    PATIENT NAME:  Julia Burgess    PATIENT DOB:   1961/01/23     MRN #:    [id number]           PHONE NUMBER:   [phone]     ADDRESS:   [address] [place] [address]    I have received a Home Sleep Apnea Testing device (MediByte Jr.) from the [place] and have been instructed how to use it. _______    Device Number:______Bedtime_________Sp02______HR______    Direct MD________________________    I have agreed to return this monitor to [facility] on  ______________ between 8:00 AM and 10:00 AM.    Anyone can drop-off HSAT device for a patient  To coordinate a later drop-off time please ask your sleep tech       This monitor is the property of [place].  The value of this equipment is  $2,495.  If you fail to return it by the date stated above, I understand that the Hospital's legal authorities will be notified.  Failure to return this monitor is considered theft of hospital property,      Signed:  ________________________   Date: _________      Witness:  _______________________   Date: _________                                  [place] RECEIPT    Patient name: Julia Burgess  [birth year]  MRN # :  [id number]  I have returned the [place] PSG Monitor,  Device Number:______ belonging to the [place] located at [address], [place], [place]    ________________________________________________________________        (DATE)    (TIME)    _____________________________    Patient signature    _____________________________    [place] Staff          Please do not smoke near or while wearing the Home Sleep Testing Equipment    Please do not allow pets on or near the [place]    In the event of an Emergency Dial 911 immediately [place]      Patient Name:        Julia Burgess    MRN #  [id number]      DOB:   [birth year]    What was your approximate bedtime? _________________________       How long did it take you to fall asleep? _________________________      How many times did you get up? _________________________      Approximately what time? / How long were you awake?    _____________/_______________    _____________/_______________    At what time did you get up for the day? __________________________    Any problems or comments?  __________________________  6.  Do you currently use a CPAP machine?   Y  N  7.  Do you use oxygen at night?   Y  N   A)  If yes, did you use it last night?  Y  N  LPM ____  8.  Have you ever had surgery for Sleep Apnea? Y  N   A)  If yes, what was done?  _____________________________________         ___________________________________________________________  9.  Did you wear dental appliance while doing your Home Study?  Y  N                       Please Complete this page  &  Return with Sleep Study Case.Marland KitchenMarland KitchenThank You!                         [facility] SLEEP CENTER    HOME SLEEP TESTING EQUIPMENT AGREEMENT FORM    PATIENT NAME:  Julia Burgess    PATIENT DOB:   February 03, 1961     MRN #:    [id number]           PHONE NUMBER:   [phone]     ADDRESS:   [place] [place] [address]    I have received a Home Sleep Apnea Testing device (MediByte Jr.) from the [place] and have been instructed how to use it. _______    Device Number:______Bedtime_________Sp02______HR______    Direct MD________________________    I have agreed to return this monitor to [facility] on  ______________ between 8:00 AM and 10:00 AM.    Anyone can drop-off HSAT device for a patient  To coordinate a later drop-off time please ask your sleep tech       This monitor is the property of [place].  The value of this equipment is $2,495.  If you fail to return it by the date stated above, I understand that the Hospital's legal authorities  will be notified.  Failure to return this monitor is considered theft of hospital property,      Signed:  ________________________   Date: _________      Witness:  _______________________   Date: _________                                  [place] RECEIPT    Patient name: Julia Burgess  08-17-1960  MRN # :  [id number]      I have returned the [place] PSG Monitor,  Device Number:______ belonging to the [place] located at [address] [place], [place]    ________________________________________________________________        (DATE)    (TIME)    _____________________________    Patient signature    _____________________________    [place] Staff          Please do not  smoke near or while wearing the Home Sleep Testing Equipment    Please do not allow pets on or near the [place]    In the event of an Emergency Dial 911 immediately

## 2022-07-11 NOTE — Progress Notes (Unsigned)
FF [place] STUDY POST TEST QUESTIONNAIRE      Patient Name:        Julia Burgess    MRN #  [id number]      DOB:   Dec 11, 1960    What was your approximate bedtime? _________________________       How long did it take you to fall asleep? _________________________      How many times did you get up? _________________________      Approximately what time? / How long were you awake?    _____________/_______________    _____________/_______________    At what time did you get up for the day? __________________________    Any problems or comments?  __________________________  6.  Do you currently use a CPAP machine?   Y  N  7.  Do you use oxygen at night?   Y  N   A)  If yes, did you use it last night?  Y  N  LPM ____  8.  Have you ever had surgery for Sleep Apnea? Y  N   A)  If yes, what was done?  _____________________________________        ___________________________________________________________  9.  Did you wear dental appliance while doing your Home Study?  Y  N                       Please Complete this page  &  Return with Sleep Study Case.Marland KitchenMarland KitchenThank You!                         [facility] SLEEP [facility]    HOME SLEEP TESTING EQUIPMENT AGREEMENT FORM    PATIENT NAME:  Julia Burgess    PATIENT DOB:   18-May-1960     MRN #:    [id number]           PHONE NUMBER:   [phone]     ADDRESS:   [address] [place] [address]    I have received a Home Sleep Apnea Testing device (MediByte Jr.) from the [place] and have been instructed how to use it. _______    Device Number:______Bedtime_________Sp02______HR______    Direct MD________________________    I have agreed to return this monitor to [facility] on  ______________ between 8:00 AM and 10:00 AM.    Anyone can drop-off HSAT device for a patient  To coordinate a later drop-off time please ask your sleep tech       This monitor is the property of [place].  The value of this equipment is  $2,495.  If you fail to return it by the date stated above, I understand that the Hospital's legal authorities will be notified.  Failure to return this monitor is considered theft of hospital property,      Signed:  ________________________   Date: _________      Witness:  _______________________   Date: _________                                  [place] RECEIPT    Patient name: Julia Burgess  08/24/1960  MRN # :  [id number]  I have returned the [place] PSG Monitor,  Device Number:______ belonging to the [place] located at [address], [place], [place]    ________________________________________________________________        (DATE)    (TIME)    _____________________________    Patient signature    _____________________________    [place] Staff          Please do not smoke near or while wearing the Home Sleep Testing Equipment    Please do not allow pets on or near the [place]    In the event of an Emergency Dial 911 immediately [place]      Patient Name:        Julia Burgess    MRN #  [id number]      DOB:   07-19-1960    What was your approximate bedtime? _________________________       How long did it take you to fall asleep? _________________________      How many times did you get up? _________________________      Approximately what time? / How long were you awake?    _____________/_______________    _____________/_______________    At what time did you get up for the day? __________________________    Any problems or comments?  __________________________  6.  Do you currently use a CPAP machine?   Y  N  7.  Do you use oxygen at night?   Y  N   A)  If yes, did you use it last night?  Y  N  LPM ____  8.  Have you ever had surgery for Sleep Apnea? Y  N   A)  If yes, what was done?  _____________________________________         ___________________________________________________________  9.  Did you wear dental appliance while doing your Home Study?  Y  N                       Please Complete this page  &  Return with Sleep Study Case.Marland KitchenMarland KitchenThank You!                         [facility] SLEEP CENTER    HOME SLEEP TESTING EQUIPMENT AGREEMENT FORM    PATIENT NAME:  Julia Burgess    PATIENT DOB:   Oct 14, 1960     MRN #:    [id number]           PHONE NUMBER:   [phone]     ADDRESS:   [address] [address] [place] [address]    I have received a Home Sleep Apnea Testing device (MediByte Jr.) from the [place] and have been instructed how to use it. _______    Device Number:______Bedtime_________Sp02______HR______    Direct MD________________________    I have agreed to return this monitor to [facility] on  ______________ between 8:00 AM and 10:00 AM.    Anyone can drop-off HSAT device for a patient  To coordinate a later drop-off time please ask your sleep tech       This monitor is the property of [place].  The value of this equipment is $2,495.  If you fail to return it by the date stated above, I understand that the Hospital's legal authorities  will be notified.  Failure to return this monitor is considered theft of hospital property,      Signed:  ________________________   Date: _________      Witness:  _______________________   Date: _________                                  [place] RECEIPT    Patient name: Julia Burgess  10-29-1960  MRN # :  [id number]      I have returned the [place] PSG Monitor,  Device Number:______ belonging to the [place] located at [address], [place], [place]    ________________________________________________________________        (DATE)    (TIME)    _____________________________    Patient signature    _____________________________    [place] Staff          Please do not  smoke near or while wearing the Home Sleep Testing Equipment    Please do not allow pets on or near the [place]    In the event of an Emergency Dial 911 immediately

## 2022-07-12 ENCOUNTER — Ambulatory Visit: Payer: Medicare (Managed Care)

## 2022-07-12 ENCOUNTER — Other Ambulatory Visit: Payer: Self-pay

## 2022-07-12 DIAGNOSIS — R0683 Snoring: Secondary | ICD-10-CM

## 2022-07-12 DIAGNOSIS — G479 Sleep disorder, unspecified: Secondary | ICD-10-CM

## 2022-07-12 DIAGNOSIS — R5383 Other fatigue: Secondary | ICD-10-CM

## 2022-07-18 ENCOUNTER — Telehealth: Payer: Self-pay

## 2022-07-18 NOTE — Telephone Encounter (Signed)
Oximeter off too much and reading low when on so test is unreliable.  Msg to Tech to call pt to repeat study.

## 2022-07-23 ENCOUNTER — Other Ambulatory Visit: Payer: Self-pay | Admitting: Primary Care

## 2022-07-25 ENCOUNTER — Other Ambulatory Visit: Payer: Self-pay

## 2022-07-25 ENCOUNTER — Telehealth: Payer: Self-pay

## 2022-07-25 ENCOUNTER — Ambulatory Visit: Payer: Medicare (Managed Care)

## 2022-07-25 NOTE — Telephone Encounter (Signed)
Returned pt call, she is coming here for a repeat HST x 2 nights, BT 2200.

## 2022-07-25 NOTE — Addendum Note (Signed)
Addended by: Sudie Grumbling on: 07/25/2022 01:22 PM     Modules accepted: Orders

## 2022-07-25 NOTE — Telephone Encounter (Signed)
Refill request    Medication: oxycodone    Confirm that the patient wants the medication sent to:   [facility], [place] - [address].  [address] [place] [address]  Phone: [phone] Fax: [phone]        Additional message: she is due for this med tomorrow, 07/26/22    Relevant labs:    Last visit: 04/13/2022   Next visit: Future Encounters      08/17/2022 10:00 AM Sch    FOLLOW UP VISIT    PCW    Persaud, [name], MD                     Additional information to be completed by clinical staff:  If the request is for a controlled substance please paste the iStop results below:      **If the refill protocol indicates they are due for labs please order them and tell the patient to have them drawn**

## 2022-07-25 NOTE — Telephone Encounter (Signed)
Julia Burgess was last seen: 04/13/2022  Next scheduled office visit: Future Encounters      08/17/2022 10:00 AM Sch    FOLLOW UP VISIT    PCW    Persaud, [name], MD

## 2022-07-25 NOTE — Progress Notes (Unsigned)
FF [place] STUDY POST TEST QUESTIONNAIRE      Patient Name:        Julia Burgess    MRN #  [id number]      DOB:   Dec 11, 1960    What was your approximate bedtime? _________________________       How long did it take you to fall asleep? _________________________      How many times did you get up? _________________________      Approximately what time? / How long were you awake?    _____________/_______________    _____________/_______________    At what time did you get up for the day? __________________________    Any problems or comments?  __________________________  6.  Do you currently use a CPAP machine?   Y  N  7.  Do you use oxygen at night?   Y  N   A)  If yes, did you use it last night?  Y  N  LPM ____  8.  Have you ever had surgery for Sleep Apnea? Y  N   A)  If yes, what was done?  _____________________________________        ___________________________________________________________  9.  Did you wear dental appliance while doing your Home Study?  Y  N                       Please Complete this page  &  Return with Sleep Study Case.Marland KitchenMarland KitchenThank You!                         [facility] SLEEP [facility]    HOME SLEEP TESTING EQUIPMENT AGREEMENT FORM    PATIENT NAME:  [name]    PATIENT DOB:   18-May-1960     MRN #:    [id number]           PHONE NUMBER:   [phone]     ADDRESS:   [address] [address] [place] [address]    I have received a Home Sleep Apnea Testing device (MediByte Jr.) from the [place] and have been instructed how to use it. _______    Device Number:______Bedtime_________Sp02______HR______    Direct MD________________________    I have agreed to return this monitor to [facility] on  ______________ between 8:00 AM and 10:00 AM.    Anyone can drop-off HSAT device for a patient  To coordinate a later drop-off time please ask your sleep tech       This monitor is the property of [place].  The value of this equipment is  $2,495.  If you fail to return it by the date stated above, I understand that the Hospital's legal authorities will be notified.  Failure to return this monitor is considered theft of hospital property,      Signed:  ________________________   Date: _________      Witness:  _______________________   Date: _________                                  [place] RECEIPT    Patient name: [name]  08/24/1960  MRN # :  [id number]  I have returned the [place] PSG Monitor,  Device Number:______ belonging to the [place] located at [address], [place], [place]    ________________________________________________________________        (DATE)    (TIME)    _____________________________    Patient signature    _____________________________    [place] Staff          Please do not smoke near or while wearing the Home Sleep Testing Equipment    Please do not allow pets on or near the [place]    In the event of an Emergency Dial 911 immediately

## 2022-07-26 MED ORDER — OXYCODONE HCL 10 MG PO TABS *I*
10.0000 mg | ORAL_TABLET | Freq: Four times a day (QID) | ORAL | 0 refills | Status: DC | PRN
Start: 2022-07-26 — End: 2022-08-17

## 2022-07-26 MED ORDER — LEVALBUTEROL TARTRATE 45 MCG/ACT IN AERO *I*
2.0000 | INHALATION_SPRAY | RESPIRATORY_TRACT | 2 refills | Status: DC | PRN
Start: 2022-07-26 — End: 2022-07-27

## 2022-07-27 ENCOUNTER — Telehealth: Payer: Self-pay

## 2022-07-27 MED ORDER — LEVALBUTEROL TARTRATE 45 MCG/ACT IN AERO *I*
2.0000 | INHALATION_SPRAY | RESPIRATORY_TRACT | 2 refills | Status: DC | PRN
Start: 2022-07-27 — End: 2022-11-11

## 2022-07-27 NOTE — Telephone Encounter (Signed)
Levalbuterol 45 mcg/act inhailer  This was sent to [facility] in [place], patient would like this to go to [facility]

## 2022-08-02 ENCOUNTER — Other Ambulatory Visit: Payer: Self-pay | Admitting: Primary Care

## 2022-08-02 NOTE — Telephone Encounter (Signed)
Julia Burgess was last seen: 04/13/2022  Next scheduled office visit: Future Encounters      08/17/2022 10:00 AM Sch    FOLLOW UP VISIT    PCW    Persaud, Krishna V, MD

## 2022-08-15 ENCOUNTER — Other Ambulatory Visit: Payer: Self-pay

## 2022-08-15 ENCOUNTER — Encounter: Payer: Self-pay | Admitting: Otolaryngology

## 2022-08-15 ENCOUNTER — Ambulatory Visit: Payer: Medicare (Managed Care) | Admitting: Otolaryngology

## 2022-08-15 VITALS — BP 142/96 | Temp 98.3°F | Ht 59.0 in | Wt 165.0 lb

## 2022-08-15 DIAGNOSIS — K148 Other diseases of tongue: Secondary | ICD-10-CM
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
## 2022-08-17 ENCOUNTER — Other Ambulatory Visit: Payer: Self-pay

## 2022-08-17 ENCOUNTER — Encounter: Payer: Self-pay | Admitting: Primary Care

## 2022-08-17 ENCOUNTER — Ambulatory Visit: Payer: Medicare (Managed Care) | Attending: Primary Care | Admitting: Primary Care

## 2022-08-17 VITALS — BP 126/76 | HR 85 | Temp 97.7°F | Ht 59.0 in | Wt 177.6 lb

## 2022-08-17 DIAGNOSIS — M545 Low back pain, unspecified: Secondary | ICD-10-CM | POA: Insufficient documentation

## 2022-08-17 DIAGNOSIS — I10 Essential (primary) hypertension: Secondary | ICD-10-CM | POA: Insufficient documentation

## 2022-08-17 DIAGNOSIS — E039 Hypothyroidism, unspecified: Secondary | ICD-10-CM | POA: Insufficient documentation

## 2022-08-17 DIAGNOSIS — J449 Chronic obstructive pulmonary disease, unspecified: Secondary | ICD-10-CM | POA: Insufficient documentation

## 2022-08-17 DIAGNOSIS — G35 Multiple sclerosis: Secondary | ICD-10-CM | POA: Insufficient documentation

## 2022-08-17 DIAGNOSIS — Z1239 Encounter for other screening for malignant neoplasm of breast: Secondary | ICD-10-CM | POA: Insufficient documentation

## 2022-08-17 DIAGNOSIS — E785 Hyperlipidemia, unspecified: Secondary | ICD-10-CM | POA: Insufficient documentation

## 2022-08-17 DIAGNOSIS — Z79899 Other long term (current) drug therapy: Secondary | ICD-10-CM

## 2022-08-17 LAB — PAIN CLINIC PROFILE
Amphetamine,UR: NEGATIVE
Benzodiazepinen,UR: NEGATIVE
Cocaine/Metab,UR: NEGATIVE
Opiates,UR: NEGATIVE
Oxycodone/Oxymorphone,UR: POSITIVE
THC Metabolite,UR: NEGATIVE

## 2022-08-17 MED ORDER — OXYCODONE HCL 10 MG PO TABS *I*
10.0000 mg | ORAL_TABLET | Freq: Every day | ORAL | 0 refills | Status: DC
Start: 2022-08-17 — End: 2022-09-19

## 2022-08-17 MED ORDER — NALOXONE HCL 4 MG/0.1ML NA LIQD *I*
NASAL | 1 refills | Status: AC
Start: 2022-08-17 — End: ?

## 2022-08-17 NOTE — Progress Notes (Signed)
Julia Burgess is a 62 y.o. female (05/26/1960)    Nurses note:   Follow-up (4 month check up ) and Colon Cancer Screening (Due )      CC: Julia Burgess presents today for a follow-up evaluation and would like to discuss her pain medication.  She is in need of a screening colonoscopy.    HPI: Julia Burgess is a 62 year old female patient with a significant past medical history of MS, hypertension, hyperlipidemia, lumbar stenosis status post lumbar spine surgery, hypothyroidism, osteoporosis, stress fracture of the sacrum, osteoarthritis of the hip and is status post left hip arthroplasty, reflux disease, and many other medical issues.  Current medicines includes Xarelto, levalbuterol, enoxaparin, olmesartan, calcium carbonate, Repatha, oxybutynin, baclofen, abaloparatide, gabapentin, atorvastatin, Metamucil, vitamin D, vitamin C, vitamin B12, multivitamins, omeprazole, dronabinol, levothyroxine, oxycodone 10 mg 4 times daily.  The patient admits to increasing low back pain and peripheral pain and often takes an extra pain medication in the middle of the night.  She is asking to increase her current pain medicine by 1 extra pill.  Already taking gabapentin several times during the course of the day and baclofen 2 or 3 times daily.  She remains at high risk for future worsening low back pain.  I did review her recent blood test result that shows a slightly elevated total and LDL cholesterol.  She had an elevated triglyceride of 288 mg/dL was urged to adhere to a strict low-fat low-cholesterol diet.  Sodium appears to be slightly diminished.  I reviewed her care gaps and find the patient needs a cervical cancer screening several updated immunizations.  She denies any lightheadedness, dizziness, headaches or chest pain, abdominal pains, nausea, vomiting, diarrhea or constipation or change in bladder function.  She has had no unexplained fevers, chills or night sweats.  No recent labs to review.  Still smoking.  ROS  As per HPI      Lab  results: 05/26/22  1128   Cholesterol 208*   HDL 59   LDL Calculated 91   Triglycerides 288*   Chol/HDL Ratio 3.53     No components found with this basename: "[employer]"        Lab results: 05/26/22  1128   Sodium 135*   Potassium 5.1   Chloride 97*   CO2 24   UN 9   Creatinine 0.8   Glucose 99   Calcium 10.3   Total Protein 6.8   Albumin 4.5   ALT 27   AST 24   Alk Phos 106*   Bilirubin,Total 0.3         Allergies   Allergen Reactions    Bee Venom Anaphylaxis    Copaxone [Glatiramer Acetate] Anaphylaxis    Glatiramer Anaphylaxis    Ketorolac Other (See Comments)     Muscle spasms    Meperidine Nausea And Vomiting and Other (See Comments)    Prochlorperazine Other (See Comments)     myoclonic        Tilactase Other (See Comments)     GI upset     Current Outpatient Medications   Medication    XARELTO 20 MG tablet    levalbuterol 45 MCG/ACT inhaler    enoxaparin (LOVENOX) 40 mg/0.62mL injection    olmesartan (BENICAR) 20 mg tablet    calcium carbonate 600 MG chewable tablet    evolocumab (REPATHA) 140 mg/mL prefilled syringe    oxybutynin (DITROPAN) 5 mg tablet    baclofen (LIORESAL) 5 mg tablet    abaloparatide (TYMLOS) 3120  MCG/1.56ML injection pen    gabapentin (NEURONTIN) 300 mg capsule    atorvastatin (LIPITOR) 40 mg tablet    psyllium (METAMUCIL) 28.3 % POWD powder    cholecalciferol (VITAMIN D) 1,000 unit tablet    ascorbic acid (VITAMIN C) 100 MG tablet    Cyanocobalamin (VITAMIN B12) 1000 MCG TBCR    Multiple Vitamins-Minerals (CENTRUM SILVER 50+WOMEN) TABS    omeprazole (PRILOSEC OTC) 20 MG tablet    dronabinol (MARINOL) 2.5 MG capsule    levothyroxine (SYNTHROID, LEVOTHROID) 88 mcg tablet    oxyCODONE (ROXICODONE) 10 mg immediate release tablet    naloxone (NARCAN) 4 mg/0.1 mL nasal spray    BMX (diphenhydrAMINE:Lidocaine:Maalox) compounded suspension    albuterol HFA (PROVENTIL, VENTOLIN, PROAIR HFA) 108 (90 Base) MCG/ACT inhaler     No current facility-administered medications for this visit.       Past  Medical History:   Diagnosis Date    Arthritis     Asthma     Chronic hypokalemia     Chronic low back pain     Depression     DVT (deep venous thrombosis)     Dysphagia     GERD (gastroesophageal reflux disease)     Heart disease     Hypercholesterolemia     Hypertension     Hypothyroidism     Multiple sclerosis     Multiple sclerosis, primary progressive     Osteoarthritis     Tobacco abuse     Tongue ulcer      Social History     Socioeconomic History    Marital status: Married   Tobacco Use    Smoking status: Every Day     Packs/day: 0.50     Years: 35.00     Additional pack years: 0.00     Total pack years: 17.50     Types: Cigarettes     Start date: 04/08/1980     Passive exposure: Current    Smokeless tobacco: Never   Substance and Sexual Activity    Alcohol use: Yes     Alcohol/week: 2.0 - 3.0 standard drinks of alcohol     Types: 2 - 3 Standard drinks or equivalent per week    Drug use: Never    Sexual activity: Not Currently     Partners: Male     Birth control/protection: Post-menopausal         Vitals:    08/17/22 1020   BP: 126/76   Pulse: 85   Temp: 36.5 C (97.7 F)   Weight: 80.6 kg (177 lb 9.6 oz)   Height: 1.499 m (4\' 11" )     Body mass index is 35.87 kg/m.    Physical Exam   On examination, blood pressure is 126/76 with a pulse of 85 and the patient has a temperature of 97.7 degree.  She weighs 177 pounds and has a BMI of 35%.  Her head and neck exam shows no oral lesion or thyromegaly bruits or adenopathy.  Chest shows decreased breath sounds but no wheezes or rhonchi or rales and heart exam is regular in rate and rhythm without murmurs, rubs or gallops.  Her abdomen is soft without tenderness rebound or guarding extremity shows no clubbing, cyanosis or edema.  She uses a rolling walker for ambulation.    Assessment/Plan    1. Encounter for long-term (current) use of high-risk medication  Presents for urine drug screen.  - Pain clinic profile    2. HBP (high blood  pressure)  Blood pressure stable  and controlled with current medication.  Encourage low-salt diet and to continue current medicines    3. Dyslipidemia  Stable dyslipidemia.  Will check lipid profile and recommend she continue with Repatha.    4. Hypothyroidism, unspecified type  History of hypothyroidism.  Will check thyroid function studies and continue current dose of levothyroxine.  - TSH; Future    5. Multiple sclerosis  She has MS.  She will continue to follow-up with neurology continue abaloparatide, baclofen and oral pain medication.    6. Chronic obstructive pulmonary disease, unspecified COPD type  Chronic obstructive lung disease.  Advised to stop smoking she will continue with albuterol as needed.    7. Low back pain  History of chronic low back pain status post low back surgery.  Will continue with oral pain medication 4-5 times daily.  I did address the issue of increasing her pain medication and I do understand that her MS is worsening and her chronic low back pain is also worsening.  She has additional hip and other pains.  I will do a trial of increasing pain medication.  She may benefit from reconsultation with pain management in the future.    8. Encounter for screening for malignant neoplasm of breast, unspecified screening modality  She was urged to have a mammography.  A request was placed.  - Mammography screening BILATERAL; Future      No follow-ups on file.

## 2022-08-26 ENCOUNTER — Encounter: Payer: Self-pay | Admitting: Gastroenterology

## 2022-08-26 NOTE — Progress Notes (Signed)
Orthopedic Surgery Follow Up Visit Clinical Note    Chief Complaint: Follow up Left posterior THA dated 01/2021    Interval history: Julia Burgess is a 62 y.o. female who follows up for the above listed surgical procedure. Today, the patient reports that they are doing okay.  She is no longer having any pain in the left hip but does continue to have issues related to her lumbar spine surgery as well as her underlying multiple sclerosis.  She continues to ambulate with a rolling walker.  She notes no falls or injuries.  Continues to have intermittent neurologic symptoms in the left lower extremity but her foot drop has largely resolved.    Patient's past medical history, medications, allergies, surgical history, review of systems, social history, and family history are documented in the orthopaedic intake sheet, reviewed by myself and scanned into the Electronic Medical Record.     Physical Examination: The patients height, weight and body-mass index are documented in the orthopaedic intake sheet, reviewed by me and scanned into the electronic medical record.     Constitutional: General appearance: appears stated age, well built, in no apparent distress    Musculoskeletal: Examination of the left lower Extremity reveals a healed incision. There is no erythema, drainage or concerns for infection. No areas that are tender to palpation. Tolerates a normal, gentle arc of motion without pain. Appropriate strength in all planes of motion. Able to dorsiflex and plantarflex the ankle and toes. Neurovascularly intact in all distributions. Palpable distal pulses    BMI: There is no height or weight on file to calculate BMI.    Imaging: Weightbearing AP radiograph of the pelvis obtained at today's visit and personally reviewed.  Demonstrate well aligned, well-fixed uncemented prosthetic components.  Also present is lumbar hardware    Assessment and Plan: Julia Burgess is a 62 y.o. female who follows up after a left posterolateral  total hip arthroplasty performed in September 2022 who is doing reasonably well.  Her pain continues to remain absent.  She has no changes in her radiographic studies.  Her neurologic symptoms have largely resolved.  She continues to struggle with chronic pelvic pain as well as low back pain.  At this point in time I see no reason to change her treatment plan.  Will see her back in 1 to 2 years       Follow up: 1-2 years    Electronically signed by:    08/26/2022, 4:14 PM        Madelaine Etienne. Marjo Bicker, MD  Orthopedic Surgeon  [facility]

## 2022-09-19 ENCOUNTER — Encounter: Payer: Self-pay | Admitting: Primary Care

## 2022-09-19 ENCOUNTER — Other Ambulatory Visit: Payer: Self-pay | Admitting: Primary Care

## 2022-09-19 NOTE — Telephone Encounter (Signed)
error 

## 2022-09-19 NOTE — Addendum Note (Signed)
Addended by: Mervyn Gay on: 09/19/2022 02:58 PM     Modules accepted: Orders

## 2022-09-19 NOTE — Telephone Encounter (Signed)
I-Stop completed, last filled: 08/26/22  Last office visit: 08/17/2022  Scheduled office visit: Future Encounters      12/19/2022 10:00 AM Sch    FOLLOW UP VISIT    PCW    Persaud, [name], MD

## 2022-09-19 NOTE — Telephone Encounter (Signed)
Refill request    Medication: oxycodone    Confirm that the patient wants the medication sent to:   [facility], [place] - [address]  [address] [address]  Phone: [phone] Fax: [phone]        Additional message:     Relevant labs:    Last visit: 08/17/2022   Next visit: Future Encounters      12/19/2022 10:00 AM Sch    FOLLOW UP VISIT    PCW    Persaud, [name], MD                     Additional information to be completed by clinical staff:  If the request is for a controlled substance please paste the iStop results below:      **If the refill protocol indicates they are due for labs please order them and tell the patient to have them drawn**

## 2022-09-20 ENCOUNTER — Other Ambulatory Visit: Payer: Self-pay | Admitting: Primary Care

## 2022-09-20 LAB — TSH: TSH: 0.58 u[IU]/mL (ref 0.27–4.20)

## 2022-09-20 MED ORDER — OXYCODONE HCL 10 MG PO TABS *I*
10.0000 mg | ORAL_TABLET | Freq: Every day | ORAL | 0 refills | Status: DC
Start: 2022-09-20 — End: 2022-09-22

## 2022-09-21 NOTE — Progress Notes (Unsigned)
RETURNED HSAT DEVICE AT [facility]    Data quality: Adequate Data - Analysis in progress    2nd Night: Yes - 2 nights for data purposes - 1st night of better data quality    Patient Questionnaire Info: Completed    Follow up date: Visit date not found / D/R PROVIDER F/U    [facility]         [facility] HSAT PATIENT QUESTIONNAIRE    Patient Sleep Diary Entries    1)   What was your approximate bedtime? Night 1, 2335, Night 2, 2200    2)   How long did it take you to fall asleep? Night 1, 35 min, Night 2, 1 hr    3)  How many times do you remember waking up?  Night 1, 1 time, Night 2, twice     A) Approx. Time ? / How long were you awake?  Night 1 0430 10 min, Night 2, 2345 - 0100 d/t leg cramps, 0300 - 0345      4) What time did you finally wake up for the day? Night 1 0730, Night 2 0700     5)  Any problems or Comments?    Blinking green, yellow, red off and on both nights     Shenitra Buggs Nolia Tschantz on 09/27/2022 at 11:24 AM  [facility]       HOME SLEEP STUDY POST TEST QUESTIONNAIRE      Patient Name:        Julia Burgess    MRN #  [id number]      What was your approximate bedtime? _________________________       How long did it take you to fall asleep? _________________________      How many times did you wake up? _________________________      Approximately what time? / How long were you awake?    _____________/_______________    _____________/_______________    _____________/_______________    _____________/_______________    At what time did you get up for the day? __________________________    Any problems or comments?  __________________________               Patient signature:______________________   Date:_______________            Please Complete this page  &  Return with Sleep Study Case.Marland KitchenMarland KitchenThank You!                                                           [facility]  HOME SLEEP TESTING EQUIPMENT LOAN AGREEMENT  Please help [place] care for your  family, friends, and neighbors by returning the equipment on-time.  If you don't return the equipment by the date stated below, you may delay another patient's diagnosis and treatment.      PATIENT NAME:  Julia Burgess    MRN#:   [id number]  PHONE NUMBER: [phone]  I have received the Home Sleep Monitor ("Equipment") identified below from the [place] [facility]") and have been instructed on how to use it. I agree that if I have questions on use of the equipment to call [facility] at [phone]; iN AN EMERGENCY I WILL CALL 911.  Serial Number:    [id number]  I agree to return the equipment to [facility], in the case provided, by________________________, between 8:00 AM and 3:30 PM, TO the SLEEP CENTER'S FRONT DESK directly to a staff member at [address] [place], [place] [address]  I agree to return the Equipment promptly and in the same condition that I borrowed it by the date stated above. I understand the Equipment is and at all times shall remain [place]'s property. I acknowledge the cost to replace the Equipment is at least $3,000. I agree to be responsible for the cost of repairing or replacing the Equipment, if it is damaged, lost, confiscated, or stolen, until it is returned to [facility].    I understand and agree that if I fail to return the Equipment, including because it is lost, stolen, or confiscated, by the date stated above, or if the Equipment is damaged when returned, that [facility] may: i) bill me for the cost of the equipment, including by adding the cost to any outstanding balance; ii) sue me to recover the cost of replacing or repairing the Equipment; and iii) take steps to terminate my physician-patient relationship with the Sleep Center. I agree to pay the [facility] costs, including reasonable attorneys' fees, for collecting the cost of repairing or replacing the Equipment.   IF YOU CANNOT RETURN THE EQUIPMENT BY THE DATE STATED ABOVE OR IF THE EQUPMENT IS  lost, stolen or damaged, you must immediately contact us AT [phone].                          [facility]  HOME SLEEP TESTING EQUIPMENT LOAN AGREEMENT  Please help [place] care for your family, friends, and neighbors by returning the equipment on-time.  If you don't return the equipment by the date stated below, you may delay another patient's diagnosis and treatment.      PATIENT NAME:  Julia Burgess     MRN#:   [id number]   PHONE NUMBER: [phone]    I have received the Home Sleep Monitor ("Equipment") identified below from the [place] [facility]") and have been instructed on how to use it. I agree that if I have questions on use of the equipment to call [facility] at [phone]; iN AN EMERGENCY I WILL CALL 911.   Serial Number:   [id number]  I agree to return the equipment to [facility], in the case provided, by________________________, between 8:00 AM and 4:00 PM, TO the SLEEP CENTER'S FRONT DESK at [address] [place], [place] [address]  I agree to return the Equipment promptly and in the same condition that I borrowed it by the date stated above. I understand the Equipment is and at all times shall remain [place]'s property. I acknowledge the cost to replace the Equipment is at least $3,000. I agree to be responsible for the cost of repairing or replacing the Equipment, if it is damaged, lost, confiscated, or stolen, until it is returned to [facility].    I understand and agree that if I fail to return the Equipment, including because it is lost, stolen, or confiscated, by the date stated above, or if the Equipment is damaged when returned, that [facility] may: i) bill me for the cost of the equipment, including by adding the cost to any outstanding balance; ii) sue me to recover the cost of replacing or repairing the Equipment; and  iii) take steps to terminate my physician-patient relationship with the Sleep Center. I agree to pay the [facility] costs, including  reasonable attorneys' fees, for collecting the cost of repairing or replacing the Equipment.   IF YOU CANNOT RETURN THE EQUIPMENT BY THE DATE STATED ABOVE OR IF THE EQUPMENT IS lost, stolen or damaged, you must immediately contact us AT [phone].    Signed:  ____________________________  Date: _________      Name: Bary Leriche     Ordering Provider: _____________________________________      Primary Care: ________________________________________     Artis Delay:  MC or Appt or Direct     Height: ________  Weight:_____________  Neck:____________      HS:_________    X: 1 or 2     Epworth: __________      SpO2 _________   HR ____________        AASM     CMS

## 2022-09-22 ENCOUNTER — Other Ambulatory Visit: Payer: Self-pay | Admitting: Primary Care

## 2022-09-22 NOTE — Telephone Encounter (Signed)
Refill request    Medication: oxyCODONE (ROXICODONE) 10 mg immediate release tablet     Confirm that the patient wants the medication sent to:   [facility] [place], [place] - [address]  [address] [place] [address]  Phone: [phone] Fax: [phone]        Additional message: patient stated that script is not due to be picked up until Sunday, but the pharmacy is not open on Sunday's. Needs to give permission to pharmacy to dispense on Saturday    Relevant labs:    Last visit: 08/17/2022   Next visit: Future Encounters      12/19/2022 10:00 AM Sch    FOLLOW UP VISIT    PCW    Persaud, [name], MD                     Additional information to be completed by clinical staff:  If the request is for a controlled substance please paste the iStop results below:      **If the refill protocol indicates they are due for labs please order them and tell the patient to have them drawn**

## 2022-09-22 NOTE — Telephone Encounter (Signed)
I-Stop completed, last filled: 08/26/22  Last office visit: 08/17/2022  Scheduled office visit: Future Encounters      12/19/2022 10:00 AM Sch    FOLLOW UP VISIT    PCW    Persaud, Krishna V, MD

## 2022-09-23 ENCOUNTER — Other Ambulatory Visit: Payer: Self-pay

## 2022-09-23 ENCOUNTER — Telehealth: Payer: Self-pay | Admitting: Primary Care

## 2022-09-23 ENCOUNTER — Ambulatory Visit: Payer: Medicare (Managed Care) | Admitting: Sleep Medicine

## 2022-09-23 DIAGNOSIS — G4733 Obstructive sleep apnea (adult) (pediatric): Secondary | ICD-10-CM

## 2022-09-23 DIAGNOSIS — R0683 Snoring: Secondary | ICD-10-CM

## 2022-09-23 MED ORDER — OXYCODONE HCL 10 MG PO TABS *I*
10.0000 mg | ORAL_TABLET | Freq: Every day | ORAL | 0 refills | Status: DC
Start: 2022-09-23 — End: 2022-10-24

## 2022-09-23 NOTE — Telephone Encounter (Signed)
She can't get her oxycodone filled until Sunday and the pharmacy is closed on Sundays.  Can someone call or send a note to the pharmacy for her to get it filled on Sat. 09/24/22

## 2022-09-23 NOTE — Telephone Encounter (Signed)
Called pharmacy they will fill for her on Saturday

## 2022-09-27 NOTE — Procedures (Signed)
Unattended HSAT Summary Report  Patient Name: Julia Burgess, Julia Burgess Patient ID: [id number]   Date of Birth: 1960-07-19 Chart Code: 7/24   Weight: 176.0 lbs Study Date: 09/23/22   Height: 4\' 11"  150 cm Age: 62   BMI: 35.6 Neck Circumference: 16" Sex:  Female   Waist:  0" Hip:  0" ESS = 7/24 Referring Physician: Donella Stade   Comments:  CMS      Total Recording Time (TRT):  388.4 minutes Cardiac   Respiratory and Snoring Events Total# Index Duration (sec.) Avg HR: 62.8 Min HR: 56.0 Max HR: 81.0      Mean Min Max         Central Apneas 0 0.0 0.0 0.0 0.0 Oximetry   Obstructive.Apneas 7 1.1 10.4 10.0 12.4 Mean SpO2 95.0%   Mixed Apneas 0 0.0 0.0 0.0 0.0 Min SpO2 87.0%   Hypopneas 13 2.0 15.6 10.0 34.0 Max SpO2 100.0%   Apnea + Hypopnea 20 3.1 13.8 10.0 34.0 SpO2 Range % Minutes         90-100 % 93.9% 368.4         80-89 % 6.0% 23.7   Desaturations 16 2.5 83.8 19.5 212.1 70-79 % 0.0% 0.0         60-69% 0.0% 0.0   Snoring 5240 809.6 0.8 0.2 4.2 50-59% 0.0% 0.0         < 50 % 0.1% 0.3      Body Position Supine Prone Left Side Right Side Total Non Supine   % Time in Position 0.0% 0.0% 99.9% 0.0% 99.9%   Snoring events 0 0 5235 0 5235   Apnea + Hypopnea events 0 0 20 0 20   Apnea + Hypopnea Index 0.0 0.0 3.1 0.0 3.1               SPO2    PULSE    RESPIRATORY   #ERR#   BODYPOS    BAD         Total Recording Time: 388.4 minutes   Patient   Julia Burgess 3.1         SCORING INFORMATION:  Data from this sleep study was acquired, scored, and interpreted based on rules, terminology and technical specification for the scoring of sleep and associated events as published in the American Academy of Sleep Medicine Manual for Scoring Sleep, October 2012. This interpretation reflects an epoch by epoch review of the entire raw data recording. Hypopneas were defined as at least a 30% reduction in air flow lasting at least 10 seconds and associated with a 4% oxygen desaturation.    IMPRESSION:    [facility] - Home Sleep  Apnea Testing Report  ---------------------------------------------------------------------------------------    This is a report for a nocturnal, unattended home sleep apnea test (HSAT) completed on Sep 23, 2022.    This study utilized a 6-channel Type III home sleep respiratory recorder.  The following parameters were recorded: snoring (high frequency vibrations in airflow), oronasal pressure airflow, RIP chest effort, SpO2, pulse rate, and body position.  There was no EEG monitoring during this study, as such we were not able to discern between periods of sleep and wake. As a result, HSATs tend to underestimate the actual severity of sleep disordered breathing.     The quality of this recording was good.    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 She did not meet criteria for a  diagnosis of obstructive sleep apnea. Her unattended apnea hypopnea index (uAHI) was 3.1 respiratory events per hour of recording time (using CMS/Medicare/4% desaturation criteria) . Her oxygen saturation nadir was 87%. Her baseline SpO2 was 95%.    This study was electronically signed by Donne Hazel, MD on 09/30/2022 at 3:19 PM.

## 2022-10-05 ENCOUNTER — Telehealth: Payer: Self-pay | Admitting: Otolaryngology

## 2022-10-05 NOTE — Telephone Encounter (Signed)
I spoke with the patient.  Her home sleep test done 09/23/2022 shows AHI 3.1, low sat 87%, no evidence of sleep apnea.  We discussed checking an in lab polysomnogram but she is not interested in that at this time.  If she thinks anything changes in the future or she wants to get tested again she will let us know

## 2022-10-17 DIAGNOSIS — E559 Vitamin D deficiency, unspecified: Secondary | ICD-10-CM

## 2022-10-17 HISTORY — DX: Vitamin D deficiency, unspecified: E55.9

## 2022-10-24 ENCOUNTER — Other Ambulatory Visit: Payer: Self-pay | Admitting: Primary Care

## 2022-10-24 MED ORDER — OXYCODONE HCL 10 MG PO TABS *I*
10.0000 mg | ORAL_TABLET | Freq: Every day | ORAL | 0 refills | Status: DC
Start: 2022-10-24 — End: 2022-11-16

## 2022-10-24 NOTE — Telephone Encounter (Signed)
Refill request    Medication:     oxyCODONE (ROXICODONE) 10 mg immediate release tablet       Confirm that the patient wants the medication sent to:   [facility] [place], [place] - [address]  [address] [place] [address]  Phone: [phone] Fax: [phone]        Additional message: [facility] will be [facility] today, and [facility] at [facility]    Relevant labs:    Last visit: 08/17/2022   Next visit: Future Encounters      12/19/2022 10:00 AM Sch    FOLLOW UP VISIT    PCW    Persaud, [name], MD                     Additional information to be completed by clinical staff:  If the request is for a controlled substance please paste the iStop results below:      **If the refill protocol indicates they are due for labs please order them and tell the patient to have them drawn**

## 2022-10-24 NOTE — Telephone Encounter (Signed)
I-Stop completed, last filled: 09/24/22  Last office visit: 08/17/2022  Scheduled office visit: Future Encounters      12/19/2022 10:00 AM Sch    FOLLOW UP VISIT    PCW    Persaud, [name], MD

## 2022-11-11 ENCOUNTER — Other Ambulatory Visit: Payer: Self-pay | Admitting: Primary Care

## 2022-11-11 NOTE — Telephone Encounter (Signed)
Julia Burgess was last seen: 08/17/2022  Next scheduled office visit: Future Encounters      12/19/2022 10:00 AM Sch    FOLLOW UP VISIT    PCW    Persaud, [name], MD

## 2022-11-15 ENCOUNTER — Encounter: Payer: Self-pay | Admitting: Gastroenterology

## 2022-11-16 ENCOUNTER — Other Ambulatory Visit: Payer: Self-pay | Admitting: Primary Care

## 2022-11-16 NOTE — Telephone Encounter (Signed)
Refill request    Medication: oxyCODONE (ROXICODONE) 10 mg immediate release tablet     Confirm that the patient wants the medication sent to:   [facility] [place], [place] - [address]  [address] [place] [address]  Phone: [phone] Fax: [phone]        Additional message:     Relevant labs:    Last Office Visit       Date Provider Department Visit Type Primary Dx    08/17/2022 Viona Gilmore, MD [facility] Visit Encounter for long-term (current) use of high-risk medication            Last Telemedicine Visit       Date Provider Department Visit Type Primary Dx    03/02/2020 Frances Maywood, NP [facility] Telemedicine Visit Lumbar disc herniation          Next visit: Future Encounters      12/19/2022 10:00 AM Sch    FOLLOW UP VISIT    PCW    Persaud, [name], MD                     Additional information to be completed by clinical staff:  If the request is for a controlled substance please paste the iStop results below:      **If the refill protocol indicates they are due for labs please order them and tell the patient to have them drawn**

## 2022-11-16 NOTE — Telephone Encounter (Signed)
I-Stop completed, last filled: 10/24/22  Last office visit: 08/17/2022  Scheduled office visit: Future Encounters      12/19/2022 10:00 AM Sch    FOLLOW UP VISIT    PCW    Persaud, [name], MD

## 2022-11-18 MED ORDER — OXYCODONE HCL 10 MG PO TABS *I*
10.0000 mg | ORAL_TABLET | Freq: Every day | ORAL | 0 refills | Status: DC
Start: 2022-11-18 — End: 2022-11-21

## 2022-11-18 NOTE — Telephone Encounter (Signed)
Patient called and stated that this medication needs to be sent to [facility] as [place] said they do not have it. I have changed the pharmacy on the script that is pended

## 2022-11-21 ENCOUNTER — Other Ambulatory Visit: Payer: Self-pay | Admitting: Primary Care

## 2022-11-21 NOTE — Telephone Encounter (Signed)
Patient called again stating that her pharmacy is unable to fill prescription for oxycodne due to limited supply. Patient also clariified dosage needed is 5mg  not 2.5mg . Patient would like prescription sent to [facility] [address], [place], [place] [address].

## 2022-11-21 NOTE — Telephone Encounter (Signed)
Patient called stating pharmacy is unable to fill this dosage but would be able to fill if the prescriptionis changed to 2.5mg  5 times daily. Can we send a new prescription please.

## 2022-11-21 NOTE — Addendum Note (Signed)
Addended by: Mervyn Gay on: 11/21/2022 03:48 PM     Modules accepted: Orders

## 2022-11-21 NOTE — Telephone Encounter (Signed)
Script is all set to sign, I set it up for the 5mg  tablet but kept the quantity of 150 tablets

## 2022-11-22 MED ORDER — OXYCODONE HCL 5 MG PO TABS *I*
10.0000 mg | ORAL_TABLET | Freq: Every day | ORAL | 0 refills | Status: DC
Start: 2022-11-22 — End: 2022-12-19

## 2022-11-22 NOTE — Telephone Encounter (Signed)
She called back to let us know the pharmacy did fill her scripts and does not need this sent in again

## 2022-11-24 ENCOUNTER — Other Ambulatory Visit: Payer: Self-pay | Admitting: Gastroenterology

## 2022-11-24 LAB — HEPATIC FUNCTION PANEL
ALT: 34 U/L — ABNORMAL HIGH (ref 0–33)
AST: 27 U/L (ref 0–40)
Albumin: 4.6 GM/DL (ref 3.5–5.2)
Alk Phos: 92 U/L (ref 35–104)
Bilirubin,Direct: 0.1 MG/DL (ref 0.0–0.3)
Bilirubin,Total: 0.3 MG/DL (ref 0.0–1.0)
Total Protein: 6.5 GM/DL — ABNORMAL LOW (ref 6.6–8.7)

## 2022-11-24 LAB — HEPATITIS A ANTIBODY, IGM: Hep A IgM: NONREACTIVE

## 2022-11-30 LAB — MISCELLANEOUS TEST HANDBILL

## 2022-12-08 ENCOUNTER — Encounter: Payer: Self-pay | Admitting: Gastroenterology

## 2022-12-13 ENCOUNTER — Other Ambulatory Visit: Payer: Self-pay | Admitting: Primary Care

## 2022-12-13 NOTE — Telephone Encounter (Signed)
Julia Burgess was last seen: 08/17/2022  Next scheduled office visit: Future Encounters      12/28/2022 11:20 AM Sch    FOLLOW UP VISIT    PCW    Persaud, [name], MD

## 2022-12-19 ENCOUNTER — Other Ambulatory Visit: Payer: Self-pay

## 2022-12-19 ENCOUNTER — Ambulatory Visit: Payer: Medicare (Managed Care) | Admitting: Primary Care

## 2022-12-19 MED ORDER — OXYCODONE HCL 5 MG PO TABS *I*
10.0000 mg | ORAL_TABLET | Freq: Every day | ORAL | 0 refills | Status: DC
Start: 2022-12-19 — End: 2023-01-05

## 2022-12-19 NOTE — Telephone Encounter (Signed)
I-Stop completed, last filled: 11/22/22  Last office visit: 08/17/2022  Scheduled office visit: Future Encounters      12/28/2022 11:20 AM Sch    FOLLOW UP VISIT    PCW    Persaud, [name], MD

## 2022-12-19 NOTE — Telephone Encounter (Signed)
LOV 08/17/22    oxyCODONE (ROXICODONE) 5 mg immediate release tablet   --  11/22/22  --  Viona Gilmore, MD     Take 2 tablets (10 mg total) by mouth 5 times daily. Max daily dose: 50 mg       [employer]

## 2022-12-26 ENCOUNTER — Ambulatory Visit: Payer: Medicare (Managed Care)

## 2022-12-28 ENCOUNTER — Other Ambulatory Visit
Admission: RE | Admit: 2022-12-28 | Discharge: 2022-12-28 | Disposition: A | Discharge status: Home / Self Care | Payer: Medicare (Managed Care) | Source: Ambulatory Visit | Attending: Primary Care | Admitting: Primary Care

## 2022-12-28 ENCOUNTER — Encounter: Payer: Self-pay | Admitting: Primary Care

## 2022-12-28 ENCOUNTER — Ambulatory Visit: Payer: Medicare (Managed Care) | Admitting: Primary Care

## 2022-12-28 ENCOUNTER — Other Ambulatory Visit: Payer: Self-pay

## 2022-12-28 VITALS — BP 118/76 | HR 74 | Temp 97.5°F | Ht 59.0 in | Wt 176.2 lb

## 2022-12-28 DIAGNOSIS — E119 Type 2 diabetes mellitus without complications: Secondary | ICD-10-CM

## 2022-12-28 DIAGNOSIS — R3129 Other microscopic hematuria: Secondary | ICD-10-CM

## 2022-12-28 DIAGNOSIS — R300 Dysuria: Secondary | ICD-10-CM

## 2022-12-28 DIAGNOSIS — E785 Hyperlipidemia, unspecified: Secondary | ICD-10-CM

## 2022-12-28 DIAGNOSIS — Z0000 Encounter for general adult medical examination without abnormal findings: Secondary | ICD-10-CM

## 2022-12-28 DIAGNOSIS — I10 Essential (primary) hypertension: Secondary | ICD-10-CM

## 2022-12-28 LAB — COMPREHENSIVE METABOLIC PANEL
ALT: 35 U/L (ref 0–35)
AST: 31 U/L (ref 0–35)
Albumin: 4.4 g/dL (ref 3.5–5.2)
Alk Phos: 85 U/L (ref 35–105)
Anion Gap: 16 (ref 7–16)
Bilirubin,Total: <0.2 mg/dL (ref 0.0–1.2)
CO2: 21 mmol/L (ref 20–28)
Calcium: 9.8 mg/dL (ref 8.6–10.2)
Chloride: 94 mmol/L — ABNORMAL LOW (ref 96–108)
Creatinine: 0.61 mg/dL (ref 0.51–0.95)
Glucose: 113 mg/dL — ABNORMAL HIGH (ref 60–99)
Lab: 6 mg/dL (ref 6–20)
Potassium: 4.5 mmol/L (ref 3.3–5.1)
Sodium: 131 mmol/L — ABNORMAL LOW (ref 133–145)
Total Protein: 6.3 g/dL (ref 6.3–7.7)
eGFR BY CREAT: 101 *

## 2022-12-28 LAB — POCT URINALYSIS DIPSTICK
Bilirubin,Ur: NEGATIVE
Glucose,UA POCT: NORMAL mg/dL
Ketones,UA POCT: NEGATIVE mg/dL
Leuk Esterase,UA POCT: NEGATIVE
Lot #: 70153302
Nitrite,UA POCT: NEGATIVE
PH,UA POCT: 5 (ref 5–8)
Protein,UA POCT: NEGATIVE mg/dL
Specific gravity,UA POCT: 1.005 (ref 1.002–1.030)
Urobilinogen,UA: NORMAL mg/dL

## 2022-12-28 LAB — CBC
Hematocrit: 42 % (ref 34–49)
Hemoglobin: 13.8 g/dL (ref 11.2–16.0)
MCV: 100 fL (ref 75–100)
Platelets: 360 10*3/uL (ref 150–450)
RBC: 4.2 MIL/uL (ref 4.0–5.5)
RDW: 13.2 % (ref 0.0–15.0)
WBC: 10.5 10*3/uL (ref 3.5–11.0)

## 2022-12-28 LAB — TSH: TSH: 0.15 u[IU]/mL — ABNORMAL LOW (ref 0.27–4.20)

## 2022-12-28 LAB — LIPID PANEL
Chol/HDL Ratio: 3.2
Cholesterol: 176 mg/dL
HDL: 55 mg/dL (ref 40–60)
LDL Calculated: 68 mg/dL
Non HDL Cholesterol: 121 mg/dL
Triglycerides: 263 mg/dL — AB
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
## 2022-12-28 NOTE — Patient Instructions (Signed)
Thank you for completing your Follow-up (4 month check up ), Urinary Symptoms (? UTI ), and Subsequent Annual Medicare Visit   with [place] today.     The purpose of this visits was to:    Screen for disease  Assess risk of future medical problems  Help develop a healthy lifestyle  Update vaccines  Get to know your doctor in case of an illness    Patient Care Team:  Viona Gilmore, MD as PCP - General (Primary Care)  Pablo Lawrence, MD (Neurology)  Ulla Gallo, MD as Provider Team (Gastroenterology)     Medicare 5 Year Plan    The following items were identified as areas of concern during your screening today:  Smoking- This is a risk factor for many kids of Cancer, Heart Attack, Stroke, Kidney Problems, Eye Problems, Asthma, COPD, and can cause an overall decrease in energy.   BMI greater than 25 - This is a risk for Heart Attack, Stroke, High Blood Pressure, Diabetes, High Cholesterol and other complications.       The Health Maintenance table below identifies screening tests and immunizations recommended by your health care team:  Health Maintenance: These screening recommendations are based on USPSTF, [employer], and [place] state guidelines   Topic Date Due   . HIV Screening  Never done   . DTaP/Tdap/Td Vaccines (1 - Tdap) Never done   . Cervical Cancer Screening / HPV Testing  Never done   . Shingles Vaccine (1 of 2) Never done   . COVID-19 Vaccine (1 - 2023-24 season) Never done   . Breast Cancer Screening  02/26/2022   . Lung Cancer Screening  12/20/2022   . Flu Shot (1) 01/08/2023   . Depression Screen Yearly  12/28/2023   . Colon Cancer Screening  12/01/2026   . Osteoporosis Screening  04/08/2027   . Hepatitis C Screening  Completed   . Pneumococcal Vaccination  Completed   . Hepatitis B Vaccine  Aged Out   . HIB Vaccine  Aged Out   . HPV Vaccine  Aged Out   . Meningococcal Vaccine  Aged Out   . Rotavirus Vaccine  Aged Out     In addition, goals and orders placed to address these recommendations are listed  in the "Today's Visit" section.    We wish you the best of health and look forward to seeing you again next year for your Annual Medicare Wellness Visit.     If you have any health care concerns before then, please do not hesitate to contact us.

## 2022-12-30 ENCOUNTER — Other Ambulatory Visit: Payer: Self-pay | Admitting: Primary Care

## 2022-12-30 MED ORDER — OMEPRAZOLE 40 MG PO CPDR *I*
40.0000 mg | DELAYED_RELEASE_CAPSULE | Freq: Two times a day (BID) | ORAL | 1 refills | Status: DC
Start: 2022-12-30 — End: 2024-03-27

## 2022-12-30 NOTE — Telephone Encounter (Signed)
Julia Burgess was last seen: 12/28/2022  Next scheduled office visit: Future Encounters      04/25/2023 11:00 AM Sch    FOLLOW UP VISIT    PCW    Persaud, [name], MD

## 2023-01-05 ENCOUNTER — Other Ambulatory Visit: Payer: Self-pay | Admitting: Primary Care

## 2023-01-05 NOTE — Addendum Note (Signed)
Addended by: Mervyn Gay on: 01/05/2023 12:06 PM     Modules accepted: Orders

## 2023-01-05 NOTE — Telephone Encounter (Signed)
Refill request    Medication: oxycodone    Confirm that the patient wants the medication sent to:   [facility], [place] - [address]  [address] [place] [address]  Phone: [phone] Fax: [phone]        Additional message:     Relevant labs:    Last Office Visit       Date Provider Department Visit Type Primary Dx    12/28/2022 Viona Gilmore, MD [facility] Visit Preventative health care            Last Telemedicine Visit       Date Provider Department Visit Type Primary Dx    03/02/2020 Frances Maywood, NP [facility] Telemedicine Visit Lumbar disc herniation          Last Telemedicine Visit    None          Next visit: Future Encounters      04/25/2023 11:00 AM Sch    FOLLOW UP VISIT    PCW    Persaud, [name], MD                     Additional information to be completed by clinical staff:  If the request is for a controlled substance please paste the iStop results below:      **If the refill protocol indicates they are due for labs please order them and tell the patient to have them drawn**

## 2023-01-05 NOTE — Telephone Encounter (Signed)
I-Stop completed, last filled: 12/22/22 for 15 day supply   Last office visit: 12/28/2022  Scheduled office visit: Future Encounters      04/25/2023 11:00 AM Sch    FOLLOW UP VISIT    PCW    Persaud, [name], MD

## 2023-01-06 MED ORDER — OXYCODONE HCL 5 MG PO TABS *I*
10.0000 mg | ORAL_TABLET | Freq: Every day | ORAL | 0 refills | Status: DC
Start: 2023-01-06 — End: 2023-01-19

## 2023-01-11 ENCOUNTER — Other Ambulatory Visit: Payer: Self-pay | Admitting: Primary Care

## 2023-01-11 NOTE — Telephone Encounter (Signed)
Refill request    Medication:   Requested Prescriptions     Pending Prescriptions Disp Refills    levalbuterol (XOPENEX HFA) 45 MCG/ACT inhaler [Pharmacy Med Name: LEVALBUTEROL TAR HFA 45 Aerosol] 15 g 0     Sig: Inhale 2 puffs into the lungs every 4-6 hours as needed for Wheezing Shake well before each use.         Confirm that the patient wants the medication sent to:   [facility], [place] - [address]  [address] [place] [address]  Phone: [phone] Fax: [phone]        Additional message:     Relevant labs:    Last Office Visit       Date Provider Department Visit Type Primary Dx    12/28/2022 Viona Gilmore, MD [facility] Visit Preventative health care            Last Telemedicine Visit       Date Provider Department Visit Type Primary Dx    03/02/2020 Frances Maywood, NP [facility] Telemedicine Visit Lumbar disc herniation          Last PA Office Visit    None          Next visit: Future Encounters      04/25/2023 11:00 AM Sch    FOLLOW UP VISIT    PCW    Persaud, [name], MD

## 2023-01-19 ENCOUNTER — Other Ambulatory Visit: Payer: Self-pay | Admitting: Primary Care

## 2023-01-19 NOTE — Telephone Encounter (Signed)
I-Stop completed, last filled: 01/06/23  Last office visit: 12/28/2022  Scheduled office visit: Future Encounters      04/25/2023 11:00 AM Sch    FOLLOW UP VISIT    PCW    Persaud, [name], MD

## 2023-01-19 NOTE — Telephone Encounter (Signed)
Refill request    Medication: oxyCODONE (ROXICODONE) 5 mg immediate release tablet     Confirm that the patient wants the medication sent to:   [facility] [place], [place] - [address]  [address] [place] [address]  Phone: [phone] Fax: [phone]        Additional message: Patient is asking fo r10 mg prescription instead of 5 mg. Patient states pharmacy filled last script for 15 days.     Relevant labs:    Last Office Visit       Date Provider Department Visit Type Primary Dx    12/28/2022 Viona Gilmore, MD [facility] Visit Preventative health care            Last Telemedicine Visit       Date Provider Department Visit Type Primary Dx    03/02/2020 Frances Maywood, NP [facility] Telemedicine Visit Lumbar disc herniation          Last PA Office Visit    None          Next visit: Future Encounters      04/25/2023 11:00 AM Sch    FOLLOW UP VISIT    PCW    Persaud, [name], MD                     Additional information to be completed by clinical staff:  If the request is for a controlled substance please paste the iStop results below:      **If the refill protocol indicates they are due for labs please order them and tell the patient to have them drawn**

## 2023-01-21 MED ORDER — OXYCODONE HCL 5 MG PO TABS *I*
10.0000 mg | ORAL_TABLET | Freq: Every day | ORAL | 0 refills | Status: DC
Start: 2023-01-21 — End: 2023-06-02

## 2023-01-30 ENCOUNTER — Other Ambulatory Visit: Payer: Self-pay | Admitting: Pulmonology

## 2023-01-30 DIAGNOSIS — F17210 Nicotine dependence, cigarettes, uncomplicated: Secondary | ICD-10-CM

## 2023-01-30 DIAGNOSIS — Z122 Encounter for screening for malignant neoplasm of respiratory organs: Secondary | ICD-10-CM

## 2023-01-30 NOTE — Progress Notes (Signed)
error 

## 2023-02-02 ENCOUNTER — Other Ambulatory Visit: Payer: Self-pay | Admitting: Primary Care

## 2023-02-02 NOTE — Telephone Encounter (Signed)
I-Stop completed, last filled: 11/22/22  Last office visit: 12/28/2022  Scheduled office visit: Future Encounters      04/25/2023 11:00 AM Sch    FOLLOW UP VISIT    PCW    Persaud, [name], MD

## 2023-02-02 NOTE — Telephone Encounter (Signed)
Refill request    Medication: oxyCODONE (ROXICODONE) 10 mg immediate release tablet     Confirm that the patient wants the medication sent to:   [facility] [place], [place] - [address]  [address] [address]  Phone: [phone] Fax: [phone]        Additional message: patient would prefer the 10mg     Relevant labs:    Last Office Visit       Date Provider Department Visit Type Primary Dx    12/28/2022 Viona Gilmore, MD [facility] Visit Preventative health care            Last Telemedicine Visit       Date Provider Department Visit Type Primary Dx    03/02/2020 Frances Maywood, NP [facility] - [facility] Telemedicine Visit Lumbar disc herniation          Last PA Office Visit    None          Next visit: Future Encounters      04/25/2023 11:00 AM Sch    FOLLOW UP VISIT    PCW    Persaud, [name], MD                     Additional information to be completed by clinical staff:  If the request is for a controlled substance please paste the iStop results below:      **If the refill protocol indicates they are due for labs please order them and tell the patient to have them drawn**

## 2023-02-03 ENCOUNTER — Other Ambulatory Visit: Payer: Self-pay | Admitting: Primary Care

## 2023-02-06 ENCOUNTER — Other Ambulatory Visit: Payer: Self-pay | Admitting: Primary Care

## 2023-02-06 ENCOUNTER — Encounter: Payer: Self-pay | Admitting: Primary Care

## 2023-02-07 ENCOUNTER — Telehealth: Payer: Self-pay | Admitting: Primary Care

## 2023-02-07 NOTE — Telephone Encounter (Signed)
There is already a refill request back for this

## 2023-02-07 NOTE — Telephone Encounter (Signed)
Refill request    Medication: oxycodone    Confirm that the patient wants the medication sent to:   [facility], [place] - [address]  [address] [place] [address]  Phone: [phone] Fax: [phone]        Additional message:     Relevant labs:    Last Office Visit       Date Provider Department Visit Type Primary Dx    12/28/2022 Viona Gilmore, MD [facility] - [facility] Visit Preventative health care            Last Telemedicine Visit       Date Provider Department Visit Type Primary Dx    03/02/2020 Frances Maywood, NP [facility] - [facility] Telemedicine Visit Lumbar disc herniation          Last PA Office Visit    None          Next visit: Future Encounters      04/25/2023 11:00 AM Sch    FOLLOW UP VISIT    PCW    Persaud, [name], MD                     Additional information to be completed by clinical staff:  If the request is for a controlled substance please paste the iStop results below:      **If the refill protocol indicates they are due for labs please order them and tell the patient to have them drawn**

## 2023-02-07 NOTE — Telephone Encounter (Signed)
Last office visit: 12/28/2022  Scheduled office visit: Future Encounters      04/25/2023 11:00 AM Sch    FOLLOW UP VISIT    PCW    Persaud, [name], MD

## 2023-02-08 MED ORDER — OXYCODONE HCL 10 MG PO TABS *I*
10.0000 mg | ORAL_TABLET | Freq: Every day | ORAL | 0 refills | Status: DC
Start: 2023-02-08 — End: 2023-03-06

## 2023-02-08 NOTE — Telephone Encounter (Signed)
Patient called and stated she would like to speak to Dr. Asa Saunas about this request. Can you please call patient at [phone].

## 2023-02-08 NOTE — Telephone Encounter (Signed)
Patient called and stated she took her last pill at noon today. Please address asap

## 2023-02-13 ENCOUNTER — Ambulatory Visit
Admission: RE | Admit: 2023-02-13 | Discharge: 2023-02-13 | Disposition: A | Discharge status: Home / Self Care | Payer: Medicare (Managed Care) | Source: Ambulatory Visit | Attending: Pulmonology | Admitting: Pulmonology

## 2023-02-13 ENCOUNTER — Other Ambulatory Visit: Payer: Self-pay

## 2023-02-13 ENCOUNTER — Other Ambulatory Visit: Payer: Self-pay | Admitting: Gastroenterology

## 2023-02-13 DIAGNOSIS — Z122 Encounter for screening for malignant neoplasm of respiratory organs: Secondary | ICD-10-CM | POA: Insufficient documentation

## 2023-02-13 DIAGNOSIS — F17210 Nicotine dependence, cigarettes, uncomplicated: Secondary | ICD-10-CM | POA: Insufficient documentation

## 2023-02-13 LAB — MAGNESIUM: Magnesium: 1.8 mg/dL (ref 1.7–2.6)

## 2023-02-13 LAB — PROTIME-INR
INR: 1.4 — ABNORMAL HIGH (ref 0.8–1.2)
Protime: 16.9 s — ABNORMAL HIGH (ref 10.1–13.7)

## 2023-02-13 LAB — BASIC METABOLIC PANEL
Anion Gap: 14
CO2: 24 MMOL/L (ref 22–29)
Calcium: 9.6 mg/dL (ref 8.6–10.4)
Chloride: 92 MMOL/L — ABNORMAL LOW (ref 98–107)
Creatinine: 0.6 mg/dL (ref 0.5–0.9)
Glucose: 144 mg/dL — ABNORMAL HIGH (ref 70–100)
Lab: 7 mg/dL — ABNORMAL LOW (ref 8–23)
Potassium: 4.4 MMOL/L (ref 3.5–5.1)
Sodium: 130 MMOL/L — ABNORMAL LOW (ref 136–145)
eGFR BY CREAT: 101

## 2023-02-13 LAB — CBC AND DIFFERENTIAL
Baso # K/uL: 0.1 10*3/uL (ref 0.0–0.2)
Basophil %: 1 % (ref 0–2)
Eos # K/uL: 0.2 10*3/uL (ref 0.0–0.7)
Eosinophil %: 2 % (ref 0–8)
Hematocrit: 39.7 % (ref 37.5–47.7)
Hemoglobin: 13.3 g/dL (ref 12.1–15.8)
Immature Granulocytes Absolute: 0.06 10*3/uL — ABNORMAL HIGH (ref 0.0–0.031)
Immature Granulocytes: 0.7 % — ABNORMAL HIGH (ref 0.0–0.4)
Lymph # K/uL: 1.5 10*3/uL (ref 1.0–5.2)
Lymphocyte %: 19 % (ref 13–42)
MCH: 33.1 pg (ref 26.4–33.6)
MCHC: 33.5 g/dL (ref 31.9–37.3)
MCV: 99 fL — ABNORMAL HIGH (ref 80–93)
Mean Platelet Volume: 8.4 fL — ABNORMAL LOW (ref 10.1–10.4)
Mono # K/uL: 0.6 10*3/uL (ref 0.3–1.1)
Monocyte %: 7 % (ref 2–12)
Neut # K/uL: 5.7 10*3/uL (ref 2.1–8.0)
Nucl RBC # K/uL: 0 10*3/uL (ref 0.0–0.012)
Nucl RBC %: 0 % (ref 0.0–0.2)
Platelets: 352 10*3 (ref 144–366)
RBC Distribution Width-SD: 51.8 fL — ABNORMAL HIGH (ref 41.0–45.3)
RBC: 4.02 10*6 — ABNORMAL LOW (ref 4.16–5.34)
RDW: 14.3 % — ABNORMAL HIGH (ref 12.8–14.2)
Seg Neut %: 70 % (ref 44–79)
WBC: 8.1 10*3/uL (ref 4.3–11.0)

## 2023-02-13 LAB — HEPATIC FUNCTION PANEL
ALT: 48 U/L — ABNORMAL HIGH (ref 0–33)
AST: 40 U/L (ref 0–40)
Albumin: 4.4 g/dL (ref 3.5–5.2)
Alk Phos: 102 U/L (ref 35–104)
Bilirubin,Direct: 0.2 mg/dL (ref 0.0–0.3)
Bilirubin,Total: 0.5 mg/dL (ref 0.0–1.0)
Total Protein: 6.7 g/dL (ref 6.6–8.7)

## 2023-02-17 ENCOUNTER — Encounter: Payer: Self-pay | Admitting: Pulmonology

## 2023-02-17 ENCOUNTER — Other Ambulatory Visit: Payer: Self-pay | Admitting: Pulmonology

## 2023-02-17 DIAGNOSIS — Z122 Encounter for screening for malignant neoplasm of respiratory organs: Secondary | ICD-10-CM

## 2023-02-17 DIAGNOSIS — F17210 Nicotine dependence, cigarettes, uncomplicated: Secondary | ICD-10-CM

## 2023-02-22 LAB — MISCELLANEOUS TEST HANDBILL

## 2023-02-23 ENCOUNTER — Other Ambulatory Visit: Payer: Self-pay | Admitting: Primary Care

## 2023-02-24 ENCOUNTER — Other Ambulatory Visit: Payer: Self-pay | Admitting: Primary Care

## 2023-02-24 NOTE — Telephone Encounter (Signed)
Julia Burgess was last seen: 12/28/2022  Next scheduled office visit: Future Encounters      04/25/2023 11:00 AM Sch    FOLLOW UP VISIT    PCW    Persaud, [name], MD

## 2023-02-27 ENCOUNTER — Telehealth: Payer: Self-pay | Admitting: Primary Care

## 2023-02-27 NOTE — Telephone Encounter (Signed)
This is already pending.

## 2023-02-27 NOTE — Telephone Encounter (Signed)
Refill request    Medication: xarelto     Confirm that the patient wants the medication sent to:   [facility], [place] - [address]  [address] [place] [address]  Phone: [phone] Fax: [phone]        Additional message: she is all out and she said the pharmacy sent [place] a request    Relevant labs:    Last Office Visit       Date Provider Department Visit Type Primary Dx    12/28/2022 Viona Gilmore, MD [facility] - [facility] Visit Preventative health care            Last Telemedicine Visit       Date Provider Department Visit Type Primary Dx    03/02/2020 Frances Maywood, NP [facility] - [facility] Telemedicine Visit Lumbar disc herniation          Last PA Office Visit    None          Next visit: Future Encounters      04/25/2023 11:00 AM Sch    FOLLOW UP VISIT    PCW    Persaud, [name], MD                     Additional information to be completed by clinical staff:  If the request is for a controlled substance please paste the iStop results below:      **If the refill protocol indicates they are due for labs please order them and tell the patient to have them drawn**

## 2023-03-06 ENCOUNTER — Other Ambulatory Visit: Payer: Self-pay | Admitting: Primary Care

## 2023-03-06 MED ORDER — OXYCODONE HCL 10 MG PO TABS *I*
10.0000 mg | ORAL_TABLET | Freq: Every day | ORAL | 0 refills | Status: DC
Start: 2023-03-06 — End: 2023-04-10

## 2023-03-06 NOTE — Telephone Encounter (Signed)
Refill request    Medication: oxycodone    Confirm that the patient wants the medication sent to:   [facility], [place] - [address]  [address] [place] [address]  Phone: [phone] Fax: [phone]        Additional message:     Relevant labs:    Last Office Visit       Date Provider Department Visit Type Primary Dx    12/28/2022 Viona Gilmore, MD [facility] - [facility] Visit Preventative health care            Last Telemedicine Visit       Date Provider Department Visit Type Primary Dx    03/02/2020 Frances Maywood, NP [facility] - [facility] Telemedicine Visit Lumbar disc herniation          Last PA Office Visit    None          Next visit: Future Encounters      04/25/2023 11:00 AM Sch    FOLLOW UP VISIT    PCW    Persaud, [name], MD                     Additional information to be completed by clinical staff:  If the request is for a controlled substance please paste the iStop results below:      **If the refill protocol indicates they are due for labs please order them and tell the patient to have them drawn**

## 2023-03-06 NOTE — Telephone Encounter (Addendum)
I-Stop completed, last filled: 02/09/2023  Last office visit: 12/28/2022  Scheduled office visit: Future Encounters      04/25/2023 11:00 AM Sch    FOLLOW UP VISIT    PCW    Persaud, [name], MD

## 2023-03-06 NOTE — Addendum Note (Signed)
Addended by: Sharlotte Alamo on: 03/06/2023 12:06 PM     Modules accepted: Orders

## 2023-03-09 ENCOUNTER — Encounter: Payer: Self-pay | Admitting: Medical

## 2023-03-09 ENCOUNTER — Other Ambulatory Visit: Payer: Self-pay

## 2023-03-09 ENCOUNTER — Ambulatory Visit: Payer: Medicare (Managed Care) | Attending: Gastroenterology | Admitting: Medical

## 2023-03-09 ENCOUNTER — Telehealth: Payer: Self-pay | Admitting: Medical

## 2023-03-09 VITALS — BP 128/87 | HR 102 | Temp 97.9°F | Ht 59.0 in | Wt 180.0 lb

## 2023-03-09 DIAGNOSIS — B172 Acute hepatitis E: Secondary | ICD-10-CM

## 2023-03-09 NOTE — Telephone Encounter (Signed)
Notified provider

## 2023-03-09 NOTE — H&P (Signed)
[name]  [id number]     03/09/2023  Ulla Gallo, MD  [address]  [place],  [place] [address]      Chief Complaint/Reason for Office Visit: Hepatitis E    Dear Dr. Trisha Mangle,    I had the pleasure of seeing your patient, Julia Burgess, in the outpatient gastroenterology/hepatology clinic. As you know, she is a 62 y.o. adult with a past medical history significant for HTN, multiple sclerosis, osteoarthritis, HLD, GERD, osteoporosis who is referred to our office for hepatitis E.    The patient reports a new diagnosis of hepatitis E when having labs prior to starting Ocrevus infusions for multiple sclerosis. The Ocrevus infusion was done in July and planned for every 6 months.    03/2022 labs show positive Hep E IgM. She states she last hd blood work done this summer. We do not have results from the most recent labs/Hep E labs if also done at that time.     She has had no recent travel out of the country where hep E is endemic and no blood transfusion. She can only recall swimming in one of the [place] years ago after which she became very ill with symptoms of fever, abdominal pain, diarrhea.     She denies fever, chills, nausea, vomiting, unintentional weight loss, loss of appetite, jaundice, icterus, edema of the lower extremities, ascites, pruritus, easy bruising/bleeding, abdominal pain    ROS:   As per HPI    Vitals:   Vitals:    03/09/23 1429   BP: 128/87   Pulse: 102   Temp: 36.6 C (97.9 F)   Weight: 81.6 kg (180 lb)   Height: 149.9 cm (4\' 11" )     Body mass index is 36.36 kg/m.      Physical Exam:   General: 62 y.o. she adult sitting comfortably in the exam room in NAD, well developed, well nourished  Skin:  No rashes, jaundice.  Warm and dry.   HEENT:  No icterus.  No oropharyngeal abnormalities.    Neck:  No masses or tracheal deviation.  Supple.  Lungs: Normal respiratory effort.    Cor:  RRR without murmur.    Abdomen:  Normal bowel sounds, no bruits.  Non-tender, non-distended. Periumbilcal hernia  present  Extremities:  Warm, no edema.   Neuro:  Alert and oriented x 3 with appropriate affect.  Ambulatory with cane.     Labs/Imaging:    Latest Reference Range & Units 02/13/23 09:38   WBC 4.3 - 11.0 X10 3u/L 8.1   RBC 4.16 - 5.34 X10 6 4.02 (L)   Hemoglobin 12.1 - 15.8 g/dL 81.1   Hematocrit 91.4 - 47.7 % 39.7   MCV 80 - 93 fL 99 (H)   MCH 26.4 - 33.6 pg 33.1   MCHC 31.9 - 37.3 g/dL 78.2   RDW 95.6 - 21.3 % 14.3 (H)   RBC Distribution Width-SD 41.0 - 45.3 fL 51.8 (H)   Platelets 144 - 366 X10 3 352   Mean Platelet Volume 10.1 - 10.4 fL 8.4 (L)   Neut # K/uL 2.1 - 8.0 X10 3/uL 5.7   Lymph # K/uL 1.0 - 5.2 X10 3/uL 1.5   Mono # K/uL 0.3 - 1.1 X10 3/uL 0.6   Eos # K/uL 0.0 - 0.7 X10 3/uL 0.2   Baso # K/uL 0.0 - 0.2 X10 3/uL 0.1   Immature Granulocytes Absolute 0.0 - 0.031 X10 3/uL 0.060 (H)   Nucl  RBC # K/uL 0.0 - 0.012 x10 3/uL 0.000   Seg Neut % 44 - 79 % 70   Lymphocyte % 13 - 42 % 19   Monocyte % 2 - 12 % 7   Eosinophil % 0 - 8 % 2   Basophil % 0 - 2 % 1   Immature Granulocytes 0.0 - 0.4 % 0.7 (H)   Nucl RBC % 0.0 - 0.2 % 0.0   (L): Data is abnormally low  (H): Data is abnormally high     Latest Reference Range & Units 02/13/23 09:38   Sodium 136 - 145 MMOL/L 130 (L)   Potassium 3.5 - 5.1 MMOL/L 4.4   Chloride 98 - 107 MMOL/L 92 (L)   CO2 22 - 29 MMOL/L 24   Anion Gap  14   UN 8 - 23 MG/DL 7 (L)   Creatinine 0.5 - 0.9 MG/DL 0.6   eGFR BY CREAT  161   Glucose 70 - 100 MG/DL 096 (H)   Calcium 8.6 - 10.4 MG/DL 9.6   Magnesium 1.7 - 2.6 MG/DL 1.8   Total Protein 6.6 - 8.7 GM/DL 6.7   Albumin 3.5 - 5.2 GM/DL 4.4   ALT 0 - 33 U/L 48 (H)   AST 0 - 40 U/L 40   Alk Phos 35 - 104 U/L 102   Bilirubin,Direct 0.0 - 0.3 MG/DL 0.2   Bilirubin,Total 0.0 - 1.0 MG/DL 0.5   (L): Data is abnormally low  (H): Data is abnormally high    03/2022        Impression(s)/Recommendation(s): [name] is a 62 y.o. adult with a past medical history significant for  HTN, multiple sclerosis, osteoarthritis, HLD, GERD, osteoporosis   presenting for evaluation of hepatitis E. Labs during work up prior to starting [employer] revealed a positive Hep E IgM 03/2022. Unclear when/how this was acquired. Hepatitis E is typically self limited and should clear without treatment. We will plan to update labs now. Her liver enzymes fluctuate between normal and mild elevations. There is no sign of synthetic liver dysfunction on labs. She is clinically asymptomatic from a liver disease standpoint.     Hepatitis E  -Repeat labs now: Hep E IgM and Hep E IgG    Follow up pending results of updated blood work. Discussed with Dr. Alois Cliche    30 minutes were spent reviewing the EMR and management of this patient.      Thank you for allowing [place] to participate in this patient's care.  Please do not hesitate to contact us with any questions or concerns at [phone].      Corey Harold, [place]

## 2023-03-09 NOTE — Patient Instructions (Signed)
-  Blood work now   -Follow up to be determined

## 2023-03-09 NOTE — Telephone Encounter (Signed)
Copied from CRM #[id number]. Topic: Appointments - Appointment Directions  >> Mar 09, 2023  2:08 PM Lelan Pons wrote:  Julia Burgess, Julia Burgess, called to report a late arrival.    Current appointment time: 2:15  Expected arrival time: 2:20-2:30- Julia Burgess in garage now  Julia Burgess was informed of late policy? yes    If necessary, Julia Burgess can be reached at [phone].

## 2023-03-10 ENCOUNTER — Encounter: Payer: Self-pay | Admitting: Medical

## 2023-03-15 ENCOUNTER — Telehealth: Payer: Self-pay | Admitting: Otolaryngology

## 2023-03-15 NOTE — Telephone Encounter (Signed)
Pt with Hx of a tongue lesion requests a sooner FUV appt, due to difficulty. Pt not sure waiting to 04/2023 is beneficial.      Pt requests nursing to call to discuss.    Please be advised, thank you.

## 2023-03-15 NOTE — Telephone Encounter (Signed)
She found lump under the tongue  and sore , complete tongue sore , spicy foods bother her to . She has seen Dr for tongue lesions before. Can we use 04/04/23 afternoon

## 2023-03-16 NOTE — Telephone Encounter (Signed)
Apt made

## 2023-03-21 ENCOUNTER — Encounter: Payer: Self-pay | Admitting: Gastroenterology

## 2023-03-21 LAB — MISCELLANEOUS TEST HANDBILL

## 2023-03-22 ENCOUNTER — Telehealth: Payer: Self-pay

## 2023-03-22 NOTE — Telephone Encounter (Signed)
*  patient would like UA to rule out UTI due to frequency and unable to completely empty bladder.    *Patient saw Neurologist wants her to see a Urologist due to her frequent UTI's that they believe are associated with her MS

## 2023-03-27 ENCOUNTER — Other Ambulatory Visit: Payer: Self-pay | Admitting: Primary Care

## 2023-03-27 DIAGNOSIS — R300 Dysuria: Secondary | ICD-10-CM

## 2023-03-29 ENCOUNTER — Encounter: Payer: Self-pay | Admitting: Gastroenterology

## 2023-03-29 ENCOUNTER — Other Ambulatory Visit: Payer: Self-pay | Admitting: Primary Care

## 2023-03-29 ENCOUNTER — Encounter: Payer: Self-pay | Admitting: Medical

## 2023-03-29 DIAGNOSIS — B172 Acute hepatitis E: Secondary | ICD-10-CM

## 2023-03-29 LAB — URINALYSIS WITH REFLEX TO MICROSCOPIC
Bilirubin,Ur: NEGATIVE mg/dL
Blood,UA: NEGATIVE mg/dL
Epithelial Cells: 257 /[LPF] — ABNORMAL HIGH (ref 0–5)
Glucose, Ur: NEGATIVE mg/dL
Ketones, UA: NEGATIVE mg/dL
Leuk Esterase,UA: 75 Leu/ul — ABNORMAL HIGH
Nitrite,UA: NEGATIVE
Protein,UA: NEGATIVE mg/dL
RBC,UA: 1 /[HPF] (ref 0–2)
Specific Gravity,UA: 1.008 (ref 1.005–1.025)
U NON-Squamous Epithelial: 2 /[LPF] (ref 0–5)
Urobilinogen,UA: NEGATIVE mg/dL
WBC,UA: 11 /[HPF] — ABNORMAL HIGH (ref 0–5)
pH,UR: 7 (ref 5.0–8.0)

## 2023-03-30 ENCOUNTER — Telehealth: Payer: Self-pay | Admitting: Primary Care

## 2023-03-30 ENCOUNTER — Other Ambulatory Visit: Payer: Self-pay | Admitting: Primary Care

## 2023-03-30 DIAGNOSIS — R350 Frequency of micturition: Secondary | ICD-10-CM

## 2023-03-30 DIAGNOSIS — N390 Urinary tract infection, site not specified: Secondary | ICD-10-CM

## 2023-03-30 LAB — UNABLE TO PERFORM ADD-ON TESTING 1

## 2023-03-30 NOTE — Telephone Encounter (Signed)
I did order urine culture for her.  Advised her to go to ED if she is having fever with urinary symptoms

## 2023-03-30 NOTE — Telephone Encounter (Signed)
Spoke to Julia Burgess per her report she has some urinary symptoms with fever.  She says today her fever was 101.  She was advised to go to emergency department immediately for possible pyelonephritis.

## 2023-03-30 NOTE — Telephone Encounter (Signed)
Her urine is in the chart but it was only ordered for micro I added the order to add on culture.  Please sign

## 2023-03-30 NOTE — Telephone Encounter (Signed)
Patient called and stated that she is running a fever now. Can you please address for Dr. Asa Saunas

## 2023-03-30 NOTE — Telephone Encounter (Signed)
Lab or imaging result question:    Question about the following lab(s) or imaging test(s): Urine results    Additional information:  Approximate date test was done: 03/29/23  Who ordered the test (i.e. PCP or another provider/speciality): Dr. Asa Saunas  Was test done at a [facility] site:no  If patient has MyChart okay to respond using it: no  Wants to knw if we can send in an antibiotic

## 2023-03-31 LAB — GRAM POS PANEL45

## 2023-03-31 LAB — AEROBIC CULTURE

## 2023-03-31 MED ORDER — LEVOFLOXACIN 500 MG PO TABS *I*
500.0000 mg | ORAL_TABLET | Freq: Every day | ORAL | 0 refills | Status: DC
Start: 2023-03-31 — End: 2023-06-02

## 2023-03-31 NOTE — Telephone Encounter (Signed)
Please advise this patient to send her another urine sample for culture as yesterday we could not add this urine culture and her previous sample.    I have sent a prescription for Levaquin 500 mg daily 1 tablet for 7 days

## 2023-03-31 NOTE — Telephone Encounter (Signed)
Patient called and stated she refused to go to the emergency room last night. She has managed the fever with tylenol. Can we please send in an antibiotic for her

## 2023-04-03 ENCOUNTER — Encounter: Payer: Self-pay | Admitting: Gastroenterology

## 2023-04-03 NOTE — Telephone Encounter (Signed)
Faxed order to the lab

## 2023-04-04 ENCOUNTER — Encounter: Payer: Self-pay | Admitting: Otolaryngology

## 2023-04-04 ENCOUNTER — Ambulatory Visit: Payer: Medicare (Managed Care) | Admitting: Otolaryngology

## 2023-04-04 ENCOUNTER — Other Ambulatory Visit: Payer: Self-pay

## 2023-04-04 VITALS — BP 150/69 | Temp 98.9°F | Ht 59.0 in | Wt 178.0 lb

## 2023-04-04 DIAGNOSIS — K146 Glossodynia: Secondary | ICD-10-CM

## 2023-04-04 DIAGNOSIS — K148 Other diseases of tongue: Secondary | ICD-10-CM

## 2023-04-04 MED ORDER — CLOBETASOL PROPIONATE 0.05 % EX GEL *A*
CUTANEOUS | 1 refills | Status: DC
Start: 2023-04-04 — End: 2023-12-22
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
## 2023-04-10 ENCOUNTER — Other Ambulatory Visit: Payer: Self-pay | Admitting: Primary Care

## 2023-04-10 NOTE — Telephone Encounter (Signed)
Refill request    Medication: oxyCODONE (ROXICODONE) 10 mg immediate release tablet     Confirm that the patient wants the medication sent to:   [facility] [place], [place] - [address]  [address] [place] [address]  Phone: [phone] Fax: [phone]        Additional message:     Relevant labs:    Last Office Visit       Date Provider Department Visit Type Primary Dx    12/28/2022 Viona Gilmore, MD [facility] Visit Preventative health care            Last Telemedicine Visit       Date Provider Department Visit Type Primary Dx    03/02/2020 Frances Maywood, NP [facility] - [facility] Telemedicine Visit Lumbar disc herniation          Last PA Office Visit    None          Next visit: Future Encounters      04/25/2023 11:00 AM Sch    FOLLOW UP VISIT    PCW    Persaud, [name], MD                     Additional information to be completed by clinical staff:  If the request is for a controlled substance please paste the iStop results below:      **If the refill protocol indicates they are due for labs please order them and tell the patient to have them drawn**

## 2023-04-10 NOTE — Telephone Encounter (Signed)
I-Stop completed, last filled: 03/11/2023  Last office visit: 12/28/2022  Scheduled office visit: Future Encounters      04/25/2023 11:00 AM Sch    FOLLOW UP VISIT    PCW    Persaud, [name], MD

## 2023-04-11 ENCOUNTER — Encounter: Payer: Self-pay | Admitting: Gastroenterology

## 2023-04-11 NOTE — Telephone Encounter (Signed)
Patient called and stated she took her last pill this morning

## 2023-04-11 NOTE — Telephone Encounter (Signed)
Patient called and stated she is out of this medication as of this morning and would like to know if this can be sent in before 5pm as that is when the pharmacy closes

## 2023-04-12 MED ORDER — OXYCODONE HCL 10 MG PO TABS *I*
10.0000 mg | ORAL_TABLET | Freq: Every day | ORAL | 0 refills | Status: DC
Start: 2023-04-12 — End: 2023-05-08

## 2023-04-13 ENCOUNTER — Encounter: Payer: Self-pay | Admitting: Gastroenterology

## 2023-04-14 ENCOUNTER — Telehealth: Payer: Self-pay

## 2023-04-14 DIAGNOSIS — N390 Urinary tract infection, site not specified: Secondary | ICD-10-CM

## 2023-04-14 NOTE — Telephone Encounter (Signed)
Patient called and would like to know if someone can please call her regarding most recent UA

## 2023-04-14 NOTE — Telephone Encounter (Signed)
The urinalysis result is from November 21 which is already treated.    Spoke to Julia Burgess and per her report she dropped a urine sample Monday or Tuesday in her lab but I do not see any new order for her to get any urinalysis done.  She was advised to make an appointment for evaluation if she has already treated herself with Levaquin for 7 days and she has not feeling better she should be getting evaluated she was advised to make an appointment as soon as possible and a UA reflex to culture was also ordered today for her to do it today or at her earliest convenience.

## 2023-04-18 ENCOUNTER — Other Ambulatory Visit: Payer: Self-pay

## 2023-04-18 LAB — BASIC METABOLIC PANEL
Anion Gap: 12
CO2: 26 MMOL/L (ref 22–29)
Calcium: 9.8 mg/dL (ref 8.6–10.4)
Chloride: 98 MMOL/L (ref 98–107)
Creatinine: 0.6 mg/dL (ref 0.5–0.9)
Glucose: 112 mg/dL — ABNORMAL HIGH (ref 70–100)
Lab: 7 mg/dL — ABNORMAL LOW (ref 8–23)
Potassium: 4.7 MMOL/L (ref 3.5–5.1)
Sodium: 136 MMOL/L (ref 136–145)
eGFR BY CREAT: 101

## 2023-04-18 LAB — TSH REFLEX GROUP: THYROID STIMULAT HORM REFLEX: 0.42 u[IU]/mL (ref 0.27–4.20)

## 2023-04-18 LAB — VITAMIN D: Vitamin D 25 Hydroxy: 18 ng/mL — ABNORMAL LOW (ref 30–100)

## 2023-04-20 ENCOUNTER — Ambulatory Visit: Payer: Medicare (Managed Care) | Admitting: Otolaryngology

## 2023-04-25 ENCOUNTER — Ambulatory Visit: Payer: Medicare (Managed Care) | Admitting: Primary Care

## 2023-05-08 ENCOUNTER — Other Ambulatory Visit: Payer: Self-pay | Admitting: Primary Care

## 2023-05-08 NOTE — Telephone Encounter (Signed)
Refill request    Medication:     oxyCODONE (ROXICODONE) 10 mg immediate release tablet     Confirm that the patient wants the medication sent to:   [facility] [place], [place] - [address]  [address] [place] [address]  Phone: [phone] Fax: [phone]        Additional message:     Relevant labs:    Last Office Visit       Date Provider Department Visit Type Primary Dx    12/28/2022 Viona Gilmore, MD [facility] Visit Preventative health care            Last Telemedicine Visit       Date Provider Department Visit Type Primary Dx    03/02/2020 Frances Maywood, NP [facility] Telemedicine Visit Lumbar disc herniation          Last PA Office Visit    None          Next visit: Future Encounters      05/19/2023  1:20 PM Sch    FOLLOW UP VISIT    PCW    Persaud, [name], MD                     Additional information to be completed by clinical staff:  If the request is for a controlled substance please paste the iStop results below:      **If the refill protocol indicates they are due for labs please order them and tell the patient to have them drawn**

## 2023-05-09 MED ORDER — OXYCODONE HCL 10 MG PO TABS *I*
10.0000 mg | ORAL_TABLET | Freq: Every day | ORAL | 0 refills | Status: DC
Start: 2023-05-09 — End: 2023-06-06

## 2023-05-09 NOTE — Telephone Encounter (Signed)
I-Stop completed, last filled: 04/12/23  Last office visit: 12/28/2022  Scheduled office visit: Future Encounters      05/19/2023  1:20 PM Sch    FOLLOW UP VISIT    PCW    Persaud, [name], MD

## 2023-05-15 ENCOUNTER — Encounter: Payer: Self-pay | Admitting: Gastroenterology

## 2023-05-17 NOTE — Addendum Note (Signed)
Addended by: Lorenz Coaster on: 05/17/2023 07:52 AM     Modules accepted: Orders

## 2023-05-19 ENCOUNTER — Ambulatory Visit: Payer: Medicare (Managed Care) | Admitting: Primary Care

## 2023-05-22 ENCOUNTER — Other Ambulatory Visit: Payer: Self-pay | Admitting: Primary Care

## 2023-05-22 NOTE — Telephone Encounter (Signed)
Julia Burgess was last seen: 12/28/2022  Next scheduled office visit: Future Encounters      05/30/2023  3:40 PM Sch    FOLLOW UP VISIT    PCW    Persaud, [name], MD

## 2023-05-30 ENCOUNTER — Ambulatory Visit: Payer: Medicare (Managed Care) | Admitting: Primary Care

## 2023-06-02 ENCOUNTER — Encounter: Payer: Self-pay | Admitting: Primary Care

## 2023-06-02 ENCOUNTER — Ambulatory Visit: Payer: Medicare (Managed Care) | Attending: Primary Care | Admitting: Primary Care

## 2023-06-02 ENCOUNTER — Other Ambulatory Visit: Payer: Self-pay

## 2023-06-02 VITALS — BP 138/86 | HR 81 | Temp 97.2°F | Ht 59.0 in | Wt 185.8 lb

## 2023-06-02 DIAGNOSIS — N390 Urinary tract infection, site not specified: Secondary | ICD-10-CM | POA: Insufficient documentation

## 2023-06-02 DIAGNOSIS — Z79899 Other long term (current) drug therapy: Secondary | ICD-10-CM | POA: Insufficient documentation

## 2023-06-02 DIAGNOSIS — M810 Age-related osteoporosis without current pathological fracture: Secondary | ICD-10-CM | POA: Insufficient documentation

## 2023-06-02 DIAGNOSIS — G35 Multiple sclerosis: Secondary | ICD-10-CM | POA: Insufficient documentation

## 2023-06-02 DIAGNOSIS — Z0000 Encounter for general adult medical examination without abnormal findings: Secondary | ICD-10-CM | POA: Insufficient documentation

## 2023-06-02 DIAGNOSIS — Z23 Encounter for immunization: Secondary | ICD-10-CM | POA: Insufficient documentation

## 2023-06-02 DIAGNOSIS — Z2882 Immunization not carried out because of caregiver refusal: Secondary | ICD-10-CM | POA: Insufficient documentation

## 2023-06-02 DIAGNOSIS — E039 Hypothyroidism, unspecified: Secondary | ICD-10-CM | POA: Insufficient documentation

## 2023-06-02 DIAGNOSIS — E785 Hyperlipidemia, unspecified: Secondary | ICD-10-CM | POA: Insufficient documentation

## 2023-06-02 DIAGNOSIS — K429 Umbilical hernia without obstruction or gangrene: Secondary | ICD-10-CM | POA: Insufficient documentation

## 2023-06-02 DIAGNOSIS — I10 Essential (primary) hypertension: Secondary | ICD-10-CM | POA: Insufficient documentation

## 2023-06-02 DIAGNOSIS — R300 Dysuria: Secondary | ICD-10-CM | POA: Insufficient documentation

## 2023-06-02 DIAGNOSIS — M545 Low back pain, unspecified: Secondary | ICD-10-CM | POA: Insufficient documentation

## 2023-06-02 LAB — POCT URINALYSIS DIPSTICK
Bilirubin,Ur: NEGATIVE
Blood,UA POCT: NEGATIVE
Glucose,UA POCT: NORMAL mg/dL
Ketones,UA POCT: NEGATIVE mg/dL
Lot #: 78306202
Nitrite,UA POCT: NEGATIVE
PH,UA POCT: 6 (ref 5–8)
Specific gravity,UA POCT: 1.015 (ref 1.002–1.030)
Urobilinogen,UA: NORMAL mg/dL

## 2023-06-02 LAB — PAIN CLINIC PROFILE
Amphetamine,UR: NEGATIVE
Benzodiazepinen,UR: NEGATIVE
Cocaine/Metab,UR: NEGATIVE
Opiates,UR: NEGATIVE
Oxycodone/Oxymorphone,UR: POSITIVE
THC Metabolite,UR: NEGATIVE

## 2023-06-02 NOTE — Patient Instructions (Signed)
 Thank you for completing your Follow-up, Subsequent Annual Medicare Visit, and Urinary Tract Infection (Frequent urination and feels pressure.)   with [place] today.     The purpose of this visits was to:    Screen for disease  Assess risk of future medical problems  Help develop a healthy lifestyle  Update vaccines  Get to know your doctor in case of an illness    Patient Care Team:  Viona Gilmore, MD as PCP - General (Primary Care)  Pablo Lawrence, MD (Neurology)  Ulla Gallo, MD as Provider Team (Gastroenterology)     Medicare 5 Year Plan    The following items were identified as areas of concern during your screening today:  Smoking- This is a risk factor for many kids of Cancer, Heart Attack, Stroke, Kidney Problems, Eye Problems, Asthma, COPD, and can cause an overall decrease in energy.   BMI greater than 25 - This is a risk for Heart Attack, Stroke, High Blood Pressure, Diabetes, High Cholesterol and other complications.       The Health Maintenance table below identifies screening tests and immunizations recommended by your health care team:  Health Maintenance: These screening recommendations are based on USPSTF, [employer], and [place] state guidelines   Topic Date Due   . HIV Screening  Never done   . DTaP/Tdap/Td Vaccines (1 - Tdap) Never done   . Cervical Cancer Screening / HPV Testing  Never done   . Shingles Vaccine (1 of 2) Never done   . Hepatitis B Vaccine (1 of 3 - Risk 3-dose series) Never done   . Breast Cancer Screening  02/26/2022   . Flu Shot (1) Never done   . COVID-19 Vaccine (1 - 2024-25 season) Never done   . Lung Cancer Screening  02/13/2024   . Depression - Yearly  06/01/2024   . Colon Cancer Screening  12/01/2026   . Osteoporosis Screening  04/08/2027   . Hepatitis C Screening  Completed   . Pneumococcal Vaccination  Completed   . HIB Vaccine  Aged Out   . HPV Vaccine  Aged Out   . Meningococcal Vaccine  Aged Out   . Rotavirus Vaccine  Aged Out     In addition, goals and orders  placed to address these recommendations are listed in the "Today's Visit" section.    We wish you the best of health and look forward to seeing you again next year for your Annual Medicare Wellness Visit.     If you have any health care concerns before then, please do not hesitate to contact us.

## 2023-06-02 NOTE — Progress Notes (Signed)
 Julia Burgess is a 63 y.o. adult (June 29, 1960)    Nurses note:   Follow-up, Subsequent Annual Medicare Visit, and Urinary Tract Infection (Frequent urination and feels pressure.)      CC: Julia Burgess presents today for a office visit and is also need of an annual wellness visit.  She is concerned about a possible urinary tract infection.    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  Recently seen by endocrinology and diagnosed with severe osteoporosis and remains at high risk for bone fracture.  Recommended to change from Tymlos to Reclast in the near future.  Recommend high-dose calcium supplementation.  Her other medicines includes baclofen, Xarelto, oxycodone, omeprazole, levothyroxine, olmesartan, naloxone, calcium carbonate, Repatha, oxybutynin, gabapentin, atorvastatin, vitamin D3, vitamin C, vitamin B12, multivitamins, Marinol, leave albuterol, and omeprazole.  On today's visit she denies any headaches or lightheadedness or dizziness, chest pain or shortness of breath or abdominal pains, changes in bowel or bladder function, fevers, chills or night sweats.  She admits to chronic pain involving her spine and lower extremities.  She uses a seated rolling walker for ambulation and has difficulty climbing up stairs.  She is a 1 pack/day smoker but denies any alcohol usage.  ROS  As per HPI    Allergies   Allergen Reactions    Bee Venom Anaphylaxis    Copaxone [Glatiramer Acetate] Anaphylaxis    Glatiramer Anaphylaxis    Ketorolac Other (See Comments)     Muscle spasms    Meperidine Nausea And Vomiting and Other (See Comments)    Prochlorperazine Other (See Comments)     myoclonic        Tilactase Other (See Comments)     GI upset      Current Outpatient Medications   Medication    baclofen 10 mg tablet    oxyCODONE (ROXICODONE) 10 mg immediate release tablet    clobetasol (TEMOVATE) 0.05 % gel    XARELTO 20 MG tablet    omeprazole (PRILOSEC) 40 mg capsule    GNP ULTICARE PEN NEEDLES 32G X 6 MM    levothyroxine (SYNTHROID, LEVOTHROID) 88 mcg tablet    naloxone (NARCAN) 4 mg/0.1 mL nasal spray    olmesartan (BENICAR) 20 mg tablet    calcium carbonate 600 MG chewable tablet    evolocumab (REPATHA) 140 mg/mL prefilled syringe    oxybutynin (DITROPAN) 5 mg tablet    abaloparatide (TYMLOS) 3120 MCG/1.56ML injection pen    gabapentin (NEURONTIN) 300 mg capsule    atorvastatin (LIPITOR) 40 mg tablet    psyllium (METAMUCIL) 28.3 % POWD powder    cholecalciferol (VITAMIN D) 1,000 unit tablet    ascorbic acid (VITAMIN C) 100 MG tablet    Cyanocobalamin (VITAMIN B12) 1000 MCG TBCR    Multiple Vitamins-Minerals (CENTRUM SILVER 50+WOMEN) TABS    dronabinol (MARINOL) 2.5 MG capsule    LEVALBUTEROL 45 MCG/ACT inhaler    levoFLOXacin (LEVAQUIN) 500 mg tablet    oxyCODONE (ROXICODONE) 5 mg immediate release tablet    EPINEPHrine (EPIPEN) 0.3 mg/0.3 mL auto-injector    BMX (diphenhydrAMINE:Lidocaine:Maalox) compounded suspension    albuterol HFA (PROVENTIL, VENTOLIN, PROAIR HFA) 108 (90 Base) MCG/ACT inhaler    omeprazole (PRILOSEC OTC) 20 MG tablet     No current facility-administered medications for this visit.  Past Medical History:   Diagnosis Date    Arthritis     Asthma     Chronic hypokalemia     Chronic low back pain     COPD (chronic obstructive pulmonary disease)     Depression     DVT (deep venous thrombosis)     Dysphagia     GERD (gastroesophageal reflux disease)     Heart disease     Hypercholesterolemia     Hypertension     Hypothyroidism     Multiple sclerosis     Multiple sclerosis, primary progressive     Osteoarthritis     Tobacco abuse     Tongue ulcer      Social History     Socioeconomic History    Marital status: Married   Tobacco  Use    Smoking status: Every Day     Packs/day: 0.50     Years: 35.00     Additional pack years: 0.00     Total pack years: 17.50     Types: Cigarettes     Start date: 04/08/1980     Passive exposure: Current    Smokeless tobacco: Never   Substance and Sexual Activity    Alcohol use: Yes     Alcohol/week: 2.0 - 3.0 standard drinks of alcohol     Types: 2 - 3 Standard drinks or equivalent per week    Drug use: Never    Sexual activity: Not Currently     Partners: Male     Birth control/protection: Post-menopausal         Vitals:    06/02/23 1400   BP: 138/86   Pulse: 81   Temp: 36.2 C (97.2 F)   Weight: 84.3 kg (185 lb 12.8 oz)   Height: 1.499 m (4\' 11" )     Body mass index is 37.53 kg/m.    Physical Exam   On examination, blood pressure is 138/86 with a pulse of 81 the patient is afebrile and weighs 185 pounds with a BMI of 37%.  Head and neck examination shows no oral lesions or thyromegaly bruits or adenopathy.  Chest exam is clear to auscultation and heart exam is regular in rate and rhythm without murmurs, rubs or gallops.  Her abdomen is soft, slightly obese without any tenderness rebound or guarding and her extremity shows no clubbing, cyanosis nor edema and pulses are noted and equal bilaterally.    Assessment/Plan    1. Parent refuses immunizations  Refuses immunizations.  - Influenza Trivalent PF 19mo-20yr ([id number])    2. Dyslipidemia  History of dyslipidemia.  Will continue Repatha.  Will check lipid profile.  Recommend a low-cholesterol diet    3. HBP (high blood pressure)  History of hypertension.  She will continue with olmesartan and low-salt diet.    4. Hypothyroidism, unspecified type  Stable hypothyroidism we will continue current dose of levothyroxine    5. Multiple sclerosis  History of stable MS.  She will follow-up with neurology recommend to continue dronabinol, gabapentin.    6. Osteoporosis, unspecified osteoporosis type, unspecified pathological fracture presence  Severe osteoporosis and  will continue high-dose of calcium, vitamin D and Tymlos.  Will be switched to Reclast in the near future.  Will need a repeat DEXA scan.    7. Low back pain  History of chronic low back pain for which she takes oxycodone and muscle relaxants on a regular basis.  Her urine drug screen will be obtained on today's visit.  8. Immunization due  Offered but refused all vaccinations.    9. Preventative health care  She is in need of an annual wellness visit which was performed today and documented elsewhere.      Follow up in about 1 year (around 06/01/2024).                    Visit performed as:            Office Visit, met with patient in person    Today we reviewed and updated Julia Burgess's smoking status, activities of daily living, depression screen, fall risk, medications and allergies.   I have counseled the patient in the above areas.     Subjective:     Chief Complaint: Julia Burgess is a 63 y.o. adult here for a/an Follow-up, Subsequent Annual Medicare Visit, and Urinary Tract Infection (Frequent urination and feels pressure.)    In general, Julia Burgess rates their overall health as:  fair      Patient Care Team:  Viona Gilmore, MD as PCP - General (Primary Care)  Pablo Lawrence, MD (Neurology)  Ulla Gallo, MD as Provider Team (Gastroenterology)     Current Outpatient Medications on File Prior to Visit   Medication Sig Dispense Refill    baclofen 10 mg tablet Take 1 tablet (10 mg total) by mouth 2 times daily.      oxyCODONE (ROXICODONE) 10 mg immediate release tablet Take 1 tablet (10 mg total) by mouth 5 times daily. Max daily dose: 50 mg 150 tablet 0    clobetasol (TEMOVATE) 0.05 % gel Use on left side of tongue twice daily as directed. 15 g 1    XARELTO 20 MG tablet TAKE 1 TABLET BY MOUTH ONCE A DAY 90 tablet 1    omeprazole (PRILOSEC) 40 mg capsule Take 1 capsule (40 mg total) by mouth 2 times daily. 180 capsule 1    GNP ULTICARE PEN NEEDLES 32G X 6 MM       levothyroxine (SYNTHROID, LEVOTHROID) 88  mcg tablet Take 1 tablet (88 mcg total) by mouth daily.      naloxone (NARCAN) 4 mg/0.1 mL nasal spray Instill 1 spray in 1 nostril once for opioid reversal. Repeat in alternating nostrils with new package every 2-3

## 2023-06-04 LAB — AEROBIC CULTURE: Aerobic Culture: 0 — AB

## 2023-06-05 ENCOUNTER — Other Ambulatory Visit: Payer: Self-pay | Admitting: Primary Care

## 2023-06-05 LAB — CONFIRM OPIATES: Confirm Opiates: POSITIVE

## 2023-06-05 MED ORDER — SULFAMETHOXAZOLE-TRIMETHOPRIM 800-160 MG PO TABS *I*
1.0000 | ORAL_TABLET | Freq: Two times a day (BID) | ORAL | 0 refills | Status: AC
Start: 2023-06-05 — End: 2023-06-10

## 2023-06-06 ENCOUNTER — Other Ambulatory Visit: Payer: Self-pay | Admitting: Primary Care

## 2023-06-06 MED ORDER — OXYCODONE HCL 10 MG PO TABS *I*
10.0000 mg | ORAL_TABLET | Freq: Every day | ORAL | 0 refills | Status: DC
Start: 2023-06-06 — End: 2023-07-03

## 2023-06-06 NOTE — Telephone Encounter (Signed)
 Refill request    Medication:   oxyCODONE (ROXICODONE) 10 mg immediate release tablet   Confirm that the patient wants the medication sent to:   [facility] [place], [place] - [address]  [address] [place] [address]  Phone: [phone] Fax: [phone]        Additional message:     Relevant labs:    Last Office Visit       Date Provider Department Visit Type Primary Dx    06/02/2023 Viona Gilmore, MD [facility] Visit Parent refuses immunizations            Last Telemedicine Visit       Date Provider Department Visit Type Primary Dx    03/02/2020 Frances Maywood, NP [facility] Telemedicine Visit Lumbar disc herniation          Last PA Office Visit    None          Next visit:            Additional information to be completed by clinical staff:  If the request is for a controlled substance please paste the iStop results below:      **If the refill protocol indicates they are due for labs please order them and tell the patient to have them drawn**

## 2023-06-06 NOTE — Telephone Encounter (Signed)
 I-Stop completed, last filled: 05/11/2023  Last office visit: 06/02/2023  Scheduled office visit:

## 2023-06-09 ENCOUNTER — Encounter: Payer: Self-pay | Admitting: Gastroenterology

## 2023-06-19 ENCOUNTER — Encounter: Payer: Self-pay | Admitting: Medical

## 2023-06-19 DIAGNOSIS — B172 Acute hepatitis E: Secondary | ICD-10-CM

## 2023-06-21 ENCOUNTER — Encounter: Payer: Self-pay | Admitting: Gastroenterology

## 2023-06-21 ENCOUNTER — Telehealth: Payer: Self-pay | Admitting: Primary Care

## 2023-06-21 ENCOUNTER — Other Ambulatory Visit: Payer: Self-pay | Admitting: Primary Care

## 2023-06-21 MED ORDER — ENOXAPARIN SODIUM 40 MG/0.4ML IJ SOSY *I*
40.0000 mg | PREFILLED_SYRINGE | Freq: Every day | INTRAMUSCULAR | 0 refills | Status: DC
Start: 2023-06-21 — End: 2023-08-22

## 2023-06-21 NOTE — Telephone Encounter (Signed)
Patient is having dental surgery with [facility] on 07/04/23.  She needs orders to hold her Xarelto.  I spoke with Dr. Asa Saunas and he said to hold her Xarelto 5 days before surgery.  He sent in Lovenox for her to bridge with.  She will start the Lovenox on the 20th and take her last dose the day of surgery.  She is then to resume Xarelto the next day after surgery.  I called and spoke to Evvie and she stated understanding of the instructions.

## 2023-06-23 ENCOUNTER — Telehealth: Payer: Self-pay

## 2023-07-03 ENCOUNTER — Other Ambulatory Visit: Payer: Self-pay | Admitting: Primary Care

## 2023-07-03 NOTE — Telephone Encounter (Signed)
 Refill request    Medication:   oxyCODONE (ROXICODONE) 10 mg immediate release tablet   Confirm that the patient wants the medication sent to:   [facility] [place], [place] - [address]  [address] [place] [address]  Phone: [phone] Fax: [phone]        Additional message:     Relevant labs:    Last Office Visit       Date Provider Department Visit Type Primary Dx    06/02/2023 Viona Gilmore, MD [facility] Visit Parent refuses immunizations            Last Telemedicine Visit       Date Provider Department Visit Type Primary Dx    03/02/2020 Frances Maywood, NP [facility] Telemedicine Visit Lumbar disc herniation          Last PA Office Visit    None          Next visit:            Additional information to be completed by clinical staff:  If the request is for a controlled substance please paste the iStop results below:      **If the refill protocol indicates they are due for labs please order them and tell the patient to have them drawn**

## 2023-07-03 NOTE — Telephone Encounter (Signed)
 I-Stop completed, last filled: 06/10/23  Last office visit: 06/02/2023  Scheduled office visit:

## 2023-07-05 ENCOUNTER — Other Ambulatory Visit: Payer: Self-pay

## 2023-07-05 LAB — INFLUENZA  A & B/RSV PCR
Influenza A PCR: DETECTED — AB
Influenza B PCR: NOT DETECTED
RSV PCR: NOT DETECTED

## 2023-07-05 LAB — UNMAPPED LAB RESULTS
Basophil # (HT): 0 10 3/uL (ref 0.0–0.1)
Basophil % (HT): 0 % (ref 0–3)
Eosinophil # (HT): 0 10 3/uL (ref 0.0–0.8)
Eosinophil % (HT): 0 % (ref 0–5)
Hematocrit (HT): 38 % (ref 38–48)
Hemoglobin (HGB) (HT): 13.3 g/dL (ref 11.9–15.9)
Lymphocyte # (HT): 0.9 10 3/uL (ref 0.6–3.3)
Lymphocyte % (HT): 17 % (ref 15–45)
MCHC (HT): 34.7 g/dL — ABNORMAL HIGH (ref 31.1–34.5)
MCV (HT): 96 fL (ref 80–97)
Mean Corpuscular Hemoglobin (MCH) (HT): 33.2 pg — ABNORMAL HIGH (ref 26.6–30.6)
Mean Platelet Volume (HT): 8.1 fL — ABNORMAL LOW (ref 9.0–12.2)
Monocyte # (HT): 0.5 10 3/uL (ref 0.1–1.1)
Monocyte % (HT): 10 % (ref 0–15)
Neutrophil # (HT): 4 10 3/uL (ref 2.0–7.1)
Platelets (HT): 254 10 3/uL (ref 150–450)
RBC (HT): 4.01 10 6/uL — ABNORMAL LOW (ref 4.04–5.48)
RDW (HT): 14 % (ref 12.9–16.3)
Seg Neut % (HT): 73 % (ref 45–75)
WBC (HT): 5.4 10 3/uL (ref 4.8–10.4)

## 2023-07-05 LAB — COMPREHENSIVE METABOLIC PANEL
ALT: 37 U/L (ref 15–60)
AST: 47 U/L — ABNORMAL HIGH (ref 15–37)
Albumin: 4 g/dL (ref 3.5–5.0)
Alk Phos: 73 U/L (ref 39–117)
Anion Gap: 12 mmol/L (ref 3–18)
Bilirubin,Total: 0.4 mg/dL (ref 0.1–1.1)
CO2: 22 meq/L (ref 22–31)
Calcium: 8.8 mg/dL (ref 8.4–10.0)
Chloride: 99 mmol/L — ABNORMAL LOW (ref 100–110)
Creatinine: 0.6 mg/dL — ABNORMAL LOW (ref 0.8–1.3)
Globulin: 3.1 g/dL (ref 2.6–3.2)
Glucose: 141 mg/dL — ABNORMAL HIGH (ref 61–100)
Lab: 8 mg/dL (ref 7–21)
Potassium: 3.1 mmol/L — ABNORMAL LOW (ref 3.5–5.2)
Sodium: 133 mmol/L — ABNORMAL LOW (ref 136–145)
Total Protein: 7.1 g/dL (ref 6.4–8.2)

## 2023-07-05 LAB — CBC AND DIFFERENTIAL
Baso # K/uL: 0 10*3/uL (ref 0.0–0.1)
Basophil %: 0 % (ref 0–3)
Eos # K/uL: 0 10*3/uL (ref 0.0–0.8)
Eosinophil %: 0 % (ref 0–5)
Hematocrit: 38 % (ref 38–48)
Hemoglobin: 13.3 g/dL (ref 11.9–15.9)
Lymph # K/uL: 0.9 10*3/uL (ref 0.6–3.3)
Lymphocyte %: 17 % (ref 15–45)
MCH: 33.2 pg — ABNORMAL HIGH (ref 26.6–30.6)
MCHC: 34.7 g/dL — ABNORMAL HIGH (ref 31.1–34.5)
MCV: 96 fL (ref 80–97)
Mean Platelet Volume: 8.1 fL — ABNORMAL LOW (ref 9.0–12.2)
Mono # K/uL: 0.5 10*3/uL (ref 0.1–1.1)
Monocyte %: 10 % (ref 0–15)
Neut # K/uL: 4 10*3/uL (ref 2.0–7.1)
Platelets: 254 10*3/uL (ref 150–450)
RBC: 4.01 10*6/uL — ABNORMAL LOW (ref 4.04–5.48)
RDW: 14 % (ref 12.9–16.3)
Seg Neut %: 73 % (ref 45–75)
WBC: 5.4 10*3/uL (ref 4.8–10.4)

## 2023-07-05 LAB — MAGNESIUM: Magnesium: 1.7 mg/dL (ref 1.7–2.2)

## 2023-07-05 LAB — COVID-19 NAAT (PCR): COVID-19 NAAT (PCR): NOT DETECTED

## 2023-07-05 LAB — TROPONIN I, HS, 0 HR: TROPONIN I, HS, 0 HR: 5 pg/mL (ref 0–50)

## 2023-07-05 LAB — EGFR CKD-EPI REFIT: eCFR CKD-EPI REFIT: >60 mL/min

## 2023-07-05 MED ORDER — OXYCODONE HCL 10 MG PO TABS *I*
10.0000 mg | ORAL_TABLET | Freq: Every day | ORAL | 0 refills | Status: DC
Start: 2023-07-05 — End: 2023-08-10

## 2023-07-06 DIAGNOSIS — J101 Influenza due to other identified influenza virus with other respiratory manifestations: Secondary | ICD-10-CM | POA: Insufficient documentation

## 2023-07-06 HISTORY — DX: Influenza due to other identified influenza virus with other respiratory manifestations: J10.1

## 2023-07-06 LAB — UNMAPPED LAB RESULTS
Hematocrit (HT): 35 % — ABNORMAL LOW (ref 38–48)
Hemoglobin (HGB) (HT): 11.7 g/dL — ABNORMAL LOW (ref 11.9–15.9)
MCHC (HT): 33.8 g/dL (ref 31.1–34.5)
MCV (HT): 98 fL — ABNORMAL HIGH (ref 80–97)
Mean Corpuscular Hemoglobin (MCH) (HT): 33 pg — ABNORMAL HIGH (ref 26.6–30.6)
Mean Platelet Volume (HT): 8.2 fL — ABNORMAL LOW (ref 9.0–12.2)
Platelets (HT): 217 10 3/uL (ref 150–450)
RBC (HT): 3.55 10 6/uL — ABNORMAL LOW (ref 4.04–5.48)
RDW (HT): 14.1 % (ref 12.9–16.3)
WBC (HT): 6.3 10 3/uL (ref 4.8–10.4)

## 2023-07-06 LAB — HEPATIC FUNCTION PANEL
ALT: 30 U/L (ref 15–60)
AST: 36 U/L (ref 15–37)
Albumin: 2.9 g/dL — ABNORMAL LOW (ref 3.5–5.0)
Alk Phos: 65 U/L (ref 39–117)
Bilirubin,Direct: <0.1 mg/dL (ref 0.0–0.3)
Bilirubin,Total: 0.6 mg/dL (ref 0.1–1.1)
Globulin: 2.9 g/dL (ref 2.6–3.2)
Total Protein: 5.8 g/dL — ABNORMAL LOW (ref 6.4–8.2)

## 2023-07-06 LAB — CBC
Hematocrit: 35 % — ABNORMAL LOW (ref 38–48)
Hemoglobin: 11.7 g/dL — ABNORMAL LOW (ref 11.9–15.9)
MCH: 33 pg — ABNORMAL HIGH (ref 26.6–30.6)
MCHC: 33.8 g/dL (ref 31.1–34.5)
MCV: 98 fL — ABNORMAL HIGH (ref 80–97)
Mean Platelet Volume: 8.2 fL — ABNORMAL LOW (ref 9.0–12.2)
Platelets: 217 10*3/uL (ref 150–450)
RBC: 3.55 10*6/uL — ABNORMAL LOW (ref 4.04–5.48)
RDW: 14.1 % (ref 12.9–16.3)
WBC: 6.3 10*3/uL (ref 4.8–10.4)

## 2023-07-06 LAB — URINE MICROSCOPIC (IQ200)

## 2023-07-06 LAB — BASIC METABOLIC PANEL
Anion Gap: 7 mmol/L (ref 3–18)
CO2: 22 meq/L (ref 22–31)
Calcium: 8.4 mg/dL (ref 8.4–10.0)
Chloride: 102 mmol/L (ref 100–110)
Creatinine: 0.5 mg/dL — ABNORMAL LOW (ref 0.8–1.3)
Glucose: 121 mg/dL — ABNORMAL HIGH (ref 61–100)
Lab: 12 mg/dL (ref 7–21)
Potassium: 4.3 mmol/L (ref 3.5–5.2)
Sodium: 131 mmol/L — ABNORMAL LOW (ref 136–145)

## 2023-07-06 LAB — URINALYSIS REFLEX TO CULTURE
Blood,UA: NEGATIVE
Glucose,UA: NEGATIVE mg/dL
Ketones, UA: NEGATIVE mg/dL
Leuk Esterase,UA: NEGATIVE
Nitrite,UA: POSITIVE — AB
Specific Gravity,UA: 1.02 (ref 1.003–1.030)
pH,UA: 6 (ref 5.0–8.0)

## 2023-07-06 LAB — HEMOGLOBIN A1C
Est Avg Glucose: 143 mg/dL — ABNORMAL HIGH (ref 68–126)
Hemoglobin A1C: 6.6 % — ABNORMAL HIGH (ref 4.2–5.6)

## 2023-07-06 LAB — TROPONIN I, HIGH SENS., 1 HR
TROP T 0-1 HR DELTA High Sensitivity: -1 pg/mL — ABNORMAL LOW (ref 0–9)
TROPONIN I, HS. 1 HR: 4 pg/mL (ref 0–50)

## 2023-07-06 LAB — PHOSPHORUS: Phosphorus: 3.2 mg/dL (ref 2.5–4.5)

## 2023-07-06 LAB — TSH WITH REFLEX FT4: TSH: 0.28 u[IU]/mL — ABNORMAL LOW (ref 0.30–4.90)

## 2023-07-06 LAB — T4, FREE: Free T4: 1 ng/dL (ref 0.6–1.4)

## 2023-07-06 LAB — MAGNESIUM: Magnesium: 1.7 mg/dL (ref 1.7–2.2)

## 2023-07-06 LAB — EGFR CKD-EPI REFIT: eCFR CKD-EPI REFIT: >60 mL/min

## 2023-07-07 ENCOUNTER — Other Ambulatory Visit: Payer: Self-pay

## 2023-07-07 NOTE — Progress Notes (Signed)
 Out of System Transitions Care Management Documentation:  (copied from hospital summary)         Hospital Admission Date/Discharge Date:     07/05/2023 3:41 PM EST - 07/06/2023 1:50 PM EST      Discharge Diagnosis at the time of discharge:      Influenza A (Primary Dx);   Shortness of breath;   Difficulty breathing;   Wheezing;   Acute cystitis with hematuria;   Cystitis;   Nicotine dependence, uncomplicated, unspecified nicotine product type      Hospital Area Discharged From:     [facility] Emergency Dept   [address]   [place], [place] [address]   [phone]      Discharge Disposition:  home    Course of Hospital Stay:        Detailed Problem Based Hospital Summary:  63 year old community dwelling female with PMHx of MS admitted with influenza A. Also found to have a UTI. Patient was nonhypoxic on room air, and was able to maintain SpO2 of 95% on examination. She was discharged with Tamiflu and a prednisone taper. She was given Vantin to treat UTI.    CODE status: Full Code   DISCHARGE DAY SERVICE   HPI & ROS:   Sitting up in bed. Endorses cough. Asking to go home.    PHYSICAL EXAMINATION:   Vitals:   07/06/23 0235 07/06/23 0242 07/06/23 0552 07/06/23 1302   BP: 122/65 (!) 150/69 140/67   Pulse: 64 69 67 79   Resp: 24 20 24 18    Temp: 36.7 C (98.1 F) 36.4 C (97.6 F) 36.7 C (98 F)   TempSrc: Oral Oral   SpO2: 95% 94% 96% 94%   Weight:   Height:     Estimated body mass index is 35.53 kg/m as calculated from the following:  Height as of this encounter: 4\' 10"  (1.473 m).  Weight as of this encounter: 170 lb (77.1 kg).   Physical Exam  Constitutional:   Appearance: She is obese.   HENT:   Head: Normocephalic and atraumatic.   Nose: Nose normal.   Mouth/Throat:   Mouth: Mucous membranes are moist.   Pharynx: Oropharynx is clear.   Eyes:   Extraocular Movements: Extraocular movements intact.   Pupils: Pupils are equal, round, and reactive to light.   Cardiovascular:   Rate and Rhythm: Normal  rate and regular rhythm.   Pulses: Normal pulses.   Heart sounds: Normal heart sounds.   Pulmonary:   Effort: Pulmonary effort is normal.   Breath sounds: Wheezing and rhonchi present.   Abdominal:   Palpations: Abdomen is soft.   Musculoskeletal:   General: Normal range of motion.   Cervical back: Normal range of motion and neck supple.   Skin:  General: Skin is warm and dry.   Neurological:   Mental Status: She is alert and oriented to person, place, and time.   Psychiatric:   Mood and Affect: Mood normal.   Behavior: Behavior normal.     Functional and cognitive status back to baseline? yes   DISCHARGE MEDICATIONS:   Please refer to AVS (after visit summary) for updated Discharge medication list (included in DC packge)   LABORATORY AND IMAGING DATA:   I have reviewed the laboratory and imaging data as documented in results review section of care connect at the time of discharge.  ALL labs from last 24h:   Results for orders placed or performed during the hospital encounter of 07/05/23 (from  the past 24 hour(s))   CBC and differential   Collection Time: 07/05/23 5:06 PM   Result Value   WBC 5.4   RBC 4.01 (L)   HGB 13.3   HCT 38   MCV 96   MCH 33.2 (H)   MCHC 34.7 (H)   RDW 14.0   PLATELET COUNT 254   MEAN PLATELET VOLUME 8.1 (L)   NEUTROPHILS 73   LYMPHOCYTES 17   MONOCYTES 10   EOSINOPHILS 0   BASOPHILS 0   NEUTROPHIL # 4.0   LYMPHOCYTE # 0.9   MONOCYTE # 0.5   EOSINOPHIL # 0.0   BASOPHIL # 0.0   Narrative   Release to patient->Immediate  Unit Collect  Source->Nasopharyngeal  Is this test for diagnosis or screening?->Diagnosis of ill  patient  Reason for COVID-19 test->Symptomatic patient  Unit Collect  Source->Nasopharyngeal  Unit Collect   Troponin I, High Sens, 0HR   Collection Time: 07/05/23 5:06 PM   Result Value   TROPONIN I, HIGH SENS, 0HR 5   Narrative   Release to patient->Immediate  Unit Collect  Source->Nasopharyngeal  Is this test for diagnosis or screening?->Diagnosis of ill  patient  Reason for  COVID-19 test->Symptomatic patient  Unit Collect  Source->Nasopharyngeal  Unit Collect   CMP   Collection Time: 07/05/23 5:06 PM   Result Value   SODIUM 133 (L)   POTASSIUM 3.1 (L)   CHLORIDE 99 (L)   CO2 22   ANION GAP 12   BUN 8   CREATININE 0.6 (L)   GLUCOSE 141 (H)   CALCIUM 8.8   TOTAL PROTEIN 7.1   ALBUMIN 4.0   GLOBULIN 3.1   BILI, TOTAL 0.4   AST 47 (H)   ALT 37   ALK PHOS 73   Narrative   Release to patient->Immediate  Unit Collect  Source->Nasopharyngeal  Is this test for diagnosis or screening?->Diagnosis of ill  patient  Reason for COVID-19 test->Symptomatic patient  Unit Collect  Source->Nasopharyngeal  Unit Collect   Mg   Collection Time: 07/05/23 5:06 PM   Result Value   MAGNESIUM 1.7   Narrative   Release to patient->Immediate  Unit Collect  Source->Nasopharyngeal  Is this test for diagnosis or screening?->Diagnosis of ill  patient  Reason for COVID-19 test->Symptomatic patient  Unit Collect  Source->Nasopharyngeal  Unit Collect   SARS-COV2 by Real Time-PCR   Collection Time: 07/05/23 5:06 PM   Specimen: Nasopharyngeal   Result Value   SOURCE Nasopharyngeal   SARS-CoV2 BY REAL TIME-PCR Not detected   Narrative   Release to patient->Immediate  Unit Collect  Source->Nasopharyngeal  Is this test for diagnosis or screening?->Diagnosis of ill  patient  Reason for COVID-19 test->Symptomatic patient  Unit Collect  Source->Nasopharyngeal  Unit Collect   Rapid Flu/RSV by PCR   Collection Time: 07/05/23 5:06 PM   Specimen: Nasopharyngeal   Result Value   INFLUENZA A PCR DETECTED (A)   INFLUENZA B PCR Not detected   RAPID RSV BY PCR Not detected   Narrative   Release to patient->Immediate  Unit Collect  Source->Nasopharyngeal  Is this test for diagnosis or screening?->Diagnosis of ill  patient  Reason for COVID-19 test->Symptomatic patient  Unit Collect  Source->Nasopharyngeal  Unit Collect   Differential   Collection Time: 07/05/23 5:06 PM   Result Value   DIFFERENTIAL AUTOMATED   Narrative   Release to  patient->Immediate  Unit Collect  Source->Nasopharyngeal  Is this test for diagnosis or screening?->Diagnosis of ill  patient  Reason for COVID-19 test->Symptomatic patient  Unit Collect  Source->Nasopharyngeal  Unit Collect   eGFR CKD-EPI REFIT   Collection Time: 07/05/23 5:06 PM   Result Value   eGFR CKD-EPI REFIT >60   Narrative   Release to patient->Immediate  Unit Collect  Source->Nasopharyngeal  Is this test for diagnosis or screening?->Diagnosis of ill  patient  Reason for COVID-19 test->Symptomatic patient  Unit Collect  Source->Nasopharyngeal  Unit Collect   Troponin I, High Sens, 1HR   Collection Time: 07/06/23 6:26 AM   Result Value   TROPONIN I, HIGH SENS, 1HR 4   TROPONIN I, HS, DELTA 0-1HR -1 (L)   Narrative   Release to patient->Immediate  Unit Collect   Hemoglobin A1C   Collection Time: 07/06/23 6:26 AM   Result Value   EAVERAGE GLUCOSE 143 (H)   HEMOGLOBIN A1C 6.6 (H)   Narrative   Release to patient->Immediate  Unit Collect   TSH with Reflex to Free T4   Collection Time: 07/06/23 6:26 AM   Result Value   TSH 0.28 (L)   Narrative   Release to patient->Immediate  Unit Collect   CBC   Collection Time: 07/06/23 6:26 AM   Result Value   WBC 6.3   RBC 3.55 (L)   HGB 11.7 (L)   HCT 35 (L)   MCV 98 (H)   MCH 33.0 (H)   MCHC 33.8   RDW 14.1   PLATELET COUNT 217   MEAN PLATELET VOLUME 8.2 (L)   Narrative   Release to patient->Immediate  Unit Collect   Basic Metabolic Panel   Collection Time: 07/06/23 6:26 AM   Result Value   SODIUM 131 (L)   POTASSIUM 4.3   CHLORIDE 102   CO2 22   ANION GAP 7   BUN 12   CREATININE 0.5 (L)   GLUCOSE 121 (H)   CALCIUM 8.4   Narrative   Release to patient->Immediate  Unit Collect   Magnesium   Collection Time: 07/06/23 6:26 AM   Result Value   MAGNESIUM 1.7   Narrative   Release to patient->Immediate  Unit Collect   Phosphorus   Collection Time: 07/06/23 6:26 AM   Result Value   PHOSPHORUS 3.2   Narrative   Release to patient->Immediate  Unit Collect   Hepatic Function Panel    Collection Time: 07/06/23 6:26 AM   Result Value   TOTAL PROTEIN 5.8 (L)   ALBUMIN 2.9 (L)   GLOBULIN 2.9   BILI, TOTAL 0.6   BILI, DIRECT <0.1   BILI, INDIRECT NC   AST 36   ALT 30   ALK PHOS 65   Narrative   Release to patient->Immediate  Unit Collect   eGFR CKD-EPI REFIT   Collection Time: 07/06/23 6:26 AM   Result Value   eGFR CKD-EPI REFIT >60   Narrative   Release to patient->Immediate  Unit Collect   Free Thyroxine   Collection Time: 07/06/23 6:26 AM   Result Value   FREE T4 1.0   Narrative   Release to patient->Immediate  Unit Collect   POCT Glucose Meter   Collection Time: 07/06/23 7:46 AM   Result Value   GLUCOSE,METER 126 (H)   Urinalysis W Reflex To Micro And Cul   Collection Time: 07/06/23 9:08 AM   Result Value   COLOR, UR AMBER (A)   APPEARANCE, UR CLEAR   GLUCOSE, UR NEG   KETONES, UR NEG   SPEC GRAVITY 1.020   BLOOD, UR NEG  PH, UR 6.0   PROTEIN, UR 30 (A)   NITRITES, UR POS (A)   LEUK ESTERASE, UR NEG   Narrative   Reason for Urinalysis?->Suspected UTI  Unit Collect   URINE MICROSCOPIC   Collection Time: 07/06/23 9:08 AM   Result Value   RBC, UR 0-2   WBC, UR 10-20 (A)   BACTERIA, UR PRESENT (A)   SQUAMOUS EPITH, UR PRESENT (A)   MUCUS, UR PRESENT (A)   Narrative   Reason for Urinalysis?->Suspected UTI  Unit Collect   POCT Glucose Meter   Collection Time: 07/06/23 11:51 AM   Result Value   GLUCOSE,METER 119 (H)       Medications: New, Changed, or Stopped:       Medications at Time of Discharge  - documented as of this encounter   Medications at Time of Discharge  Medication Sig Dispensed Refills Start Date End Date   abaloparatide (TYMLOS) 80 mcg (3,120 mcg/1.56 mL) SubQ PnIj   Indications: Osteoporosis, senile Inject 80 mcg into the skin daily. 1.56 mL  5 02/21/2023     acetaminophen 500 MG Oral tablet   Indications: S/P total left hip arthroplasty Take 2 tablets by mouth every 8 (eight) hours     01/29/2021     ascorbic acid, vitamin C, (VITAMIN C) 100 MG tablet  Take 1 tablet by mouth daily.            atorvastatin 20 MG Oral tablet  Take 1 tablet by mouth daily.           baclofen 10 MG Oral tablet  Take 0.5 tablets by mouth 3 (three) times daily.           calcium carbonate (TUMS) 200 mg calcium (500 mg) Oral chewable tablet   Indications: Other osteoporosis without current pathological fracture Take 1 tablet by mouth 2 (two) times daily. 60 tablet  5 03/27/2020     cefpodoxime 100 MG Oral tablet   Indications: URINARY TRACT INFECTION Take 1 tablet by mouth 2 (two) times daily with meals for 5 days. Indications: URINARY TRACT INFECTION 9 tablet    07/06/2023 07/11/2023   dronabinoL 2.5 MG Oral capsule  TAKE 1 CAPSULE BY MOUTH SIX TIMES DAILY . DO NOT EXCEED 6 PER 24 HOURS     05/20/2020     ergocalciferol (VITAMIN D2) 1,250 mcg (50,000 unit) Oral capsule   Indications: Vitamin D insufficiency Take 1 capsule by mouth once a week. 12 capsule    04/20/2023     gabapentin 300 MG Oral capsule  Take 4 capsules by mouth 3 (three) times daily.           lactase 9,000 unit Oral Tab   Indications: lactose intolerance Take 1 tablet by mouth 3 (three) times daily with meals. Indications: inability of body to handle lactose or milk sugar 90 tablet    02/05/2020     LEVOTHYROXINE 88 MCG Oral tablet   Indications: Hypothyroidism (acquired) TAKE 1 TABLET BY MOUTH ONCE A DAY 90 tablet  3 01/16/2023     mecobalamin (B12 ACTIVE ORAL)  Take by mouth.           multivit-min/iron/folic/lutein (CENTRUM SILVER WOMEN ORAL)  Take by mouth. Takes two daily           nicotine 21 mg/24 hr TD   Indications: Nicotine dependence, uncomplicated, unspecified nicotine product type Place 1 patch onto the skin daily. 1 patch    07/06/2023     olmesartan 20  MG Oral tablet  Take 1 tablet by mouth daily.           omeprazole 20 MG Oral tablet  Take 1 tablet by mouth as needed.           oseltamivir 75 MG Oral capsule   Indications: Influenza A Take 1 capsule by mouth every 12 (twelve) hours for 4 days. Indications: Influenza A 8 capsule     07/06/2023 07/10/2023   oxyCODONE 10 mg Oral immediate release tablet   Indications: Orthopedic aftercare Take 1.5 tablets by mouth every 6 (six) hours as needed for Pain. Max Daily Amount: 60 mg 30 tablet    03/02/2021     pen needle, diabetic (BD ULTRA-FINE MINI PEN NEEDLE) 31 gauge x 3/16" Misc Ndle   Indications: Osteoporosis, senile 1 each by Misc.(Non-Drug; Combo Route) route daily. 100 each  3 02/21/2023     predniSONE 10 mg Oral tablet pack   Indications: Influenza A Take 4 tablets by mouth daily for 4 days, THEN 3 tablets daily for 3 days, THEN 2 tablets daily for 2 days, THEN 1 tablet daily for 1 day. 30 tablet    07/06/2023 07/16/2023   psyllium husk (METAMUCIL ORAL)  Take by mouth. 2 teaspoons daily           REPATHA SURECLICK 140 mg/mL SubQ PnIj injection  Inject 1 mL into the skin every 14 (fourteen) days.     02/10/2022     rivaroxaban (XARELTO) 20 mg Oral Tab tablet   Indications: Anticoagulant long-term use Take 1 tablet by mouth daily with dinner. 30 tablet  5 12/03/2019     XOPENEX HFA 45 mcg/actuation Inhl inhaler   Indications: Uncomplicated asthma, unspecified asthma severity, unspecified whether persistent INHALE 2 PUFFS INTO LUNGS EVERY 4 TO 6 HOURS AS NEEDED 1 Inhaler    09/15/2017       Ordered Prescriptions  - documented in this encounter  Reconcile with Patient's Chart  Ordered Prescriptions  Prescription Sig Dispensed Refills Start Date End Date   predniSONE 10 mg Oral tablet pack   Indications: Influenza A Take 4 tablets by mouth daily for 4 days, THEN 3 tablets daily for 3 days, THEN 2 tablets daily for 2 days, THEN 1 tablet daily for 1 day. 30 tablet    07/06/2023 07/16/2023   oseltamivir 75 MG Oral capsule   Indications: Influenza A Take 1 capsule by mouth every 12 (twelve) hours for 4 days. Indications: Influenza A 8 capsule    07/06/2023 07/10/2023   cefpodoxime 100 MG Oral tablet   Indications: URINARY TRACT INFECTION Take 1 tablet by mouth 2 (two) times daily with meals for 5  days. Indications: URINARY TRACT INFECTION 9 tablet    07/06/2023 07/11/2023   nicotine 21 mg/24 hr TD   Indications: Nicotine dependence, uncomplicated, unspecified nicotine product type Place 1 patch onto the skin daily. 1 patch    07/06/2023     predniSONE 10 mg Oral tablet pack   Indications: Influenza A Take 4 tablets by mouth daily for 4 days, THEN 3 tablets daily for 3 days, THEN 2 tablets daily for 2 days, THEN 1 tablet daily for 1 day. 30 tablet    07/06/2023 07/06/2023                          Future Appointments: Liberti will call next week to make an appointment    PCP appointment scheduled: No  TCM POST DISCHARGE CONTACT 3 OUTREACH  Risk of Admission or ED Visit  Current as of 12 minutes ago        17% 40 to 100%: High Risk   20 to < 40%: Medium Risk   0 to < 20%: Low Risk     Last Change:           This score indicates an adult patient's 1-year risk, as a percentage, of a hospital admission or ED visit.             Current PCP:  Viona Gilmore, MD                         Care Management Intervention: Care Manager Intervention Completed       Spoke To:: patient      Are you feeling as good as you did when you left the hospital?: Yes         Has home care agency contacted patient yet?: N/A      If equipment was ordered at time of discharge, does the patient have this in the home?: N/A       Is the patient aware of pending orders / testing? : N/A      Do you have any questions about your discharge instructions?: No      Do you have an appointment scheduled with your PCP and know the date and time of that appointment?: No         Do you have all of your medications listed on your discharge summary and are you taking them as they are written on the bottle?: Yes            Do you have any questions or concerns about your medications?: No         If the patient is newly initiated on anticoagulation, are they aware who is managing this?: N/A      Reviewed medications with:: patient      Time spent on  the call with the patient:: 5 minutes      Patient Care Management assessment and plan of care/next steps:: Writer called and spoke to [place] today.  Daffney stated she feels like crap, she has been coughing and nothing seems to be helping with her cough.  Writer inquired if she has a puffer to take and cough medicine.  Laysha said she has taken them, but nothing seems to help.  Georganna was heard coughing during out phone call.  She does have the discharge medications. Danel said she would like to follow up with her provider, did not want to make a follow up at this time.  Let Diany know if she is still coughing or short of breath to go to Urgent Care over the weekend.  Arneta said she did originally go to Urgent Care and they sent her to the hospital.  She is concerned the admission may mess up her co-pay.  Encouraged her to call Clinton Sawyer if she needs anything.

## 2023-07-07 NOTE — Patient Instructions (Signed)
 We care about you and your health and are monitoring your health now that you're home from the hospital.  I will be your Transitional Care Manager and will be managing your care up to the next 30 days.   As I review your progress during this period of transition at home, you will see a Patient Outreach After Visit Summary in MyChart as a result of direct phone calls with you, or my review of your patient chart. This is a standard of care and there is no charge for this service.       Please feel free to reach out to me with any questions related to this.     If you have any urgent requests or issues, please call your doctor's office.     Katrine Coho, RN

## 2023-07-08 ENCOUNTER — Other Ambulatory Visit: Payer: Self-pay | Admitting: Primary Care

## 2023-07-08 LAB — AEROBIC CULTURE

## 2023-07-08 LAB — MIN INHIB CONC MICRODILUTION
Cefepime: 8 ug/mL — AB
Ceftazidime: 4 ug/mL — AB
Ciprofloxacin: <=0.25 ug/mL — AB
Levofloxacin: 1 ug/mL — AB
Tobramycin: <=1 ug/mL — AB

## 2023-07-09 NOTE — Telephone Encounter (Signed)
 Med Refill Note    Historical medication refill order pended     07/09/23  Julia Burgess  Oct 22, 1960  MRN: [id number]      Requested Prescriptions     Pending Prescriptions Disp Refills    levalbuterol (XOPENEX HFA) 45 MCG/ACT inhaler [Pharmacy Med Name: LEVALBUTEROL TAR HFA 45 Aerosol] 15 g 2     Sig: Inhale 2 puffs into the lungs every 4-6 hours as needed for Wheezing Shake well before each use.       Last Fill Date: 06/07/23  Last Order Date: 05/22/23      Last office visit with doctor:   06/02/2023  Last office visit with APP:   Visit date not found      Patients upcoming appointments:  Future Appointments   Date Time Provider Department Center   07/31/2023 10:00 AM Wilfred Curtis, MD [facility] None   08/02/2023  1:30 PM [facility] MOBILE BI 01 [facility] Mobile [facility]       Recent Lab results:  GENERAL CHEMISTRY   Recent Labs     07/06/23  0626 07/05/23  1706 04/18/23  1041   NA 131* 133* 136   K 4.3 3.1* 4.7   CL 102 99* 98   CO2 22 22 26    GAP 7 12 12    UN 12 8 7*   CREAT 0.5* 0.6* 0.6   GLU 121* 141* 112*   CA 8.4 8.8 9.8      LIPID PROFILE   Recent Labs     12/28/22  1209   CHOL 176   TRIG 263*   HDL 55   LDLC 68      LIVER PROFILE   Recent Labs     07/06/23  0626 07/05/23  1706 02/13/23  0938   ALT 30 37 48*   AST 36 47* 40   ALK 65 73 102   TB 0.6 0.4 0.5      DIABETES THYROID   Recent Labs     07/06/23  0626   HA1C 6.6*    Recent Labs     07/06/23  0626 12/28/22  1209 09/20/22  0942   TSH 0.28* 0.15* 0.58                   Yeshua Stryker, RN

## 2023-07-31 ENCOUNTER — Ambulatory Visit: Payer: Medicare (Managed Care) | Admitting: Urology

## 2023-07-31 ENCOUNTER — Telehealth: Payer: Self-pay | Admitting: Urology

## 2023-07-31 NOTE — Telephone Encounter (Unsigned)
 Copied from CRM #[id number]. Topic: Appointments - Reschedule Appointment  >> Jul 31, 2023  8:19 AM Sheilah Pigeon wrote:  Julia Burgess, patient,  is calling to cancel the patients  NPV appointment which is currently scheduled on 07/31/23 with Dr. Dorian Pod.    Reason for the cancellation: sick    Has the appointment been cancelled? yes    Has the appointment been rescheduled? no    Date of new appointment? N/A     Does the patient need a call back to reschedule?no    Patient can be reached at [phone]

## 2023-08-02 ENCOUNTER — Ambulatory Visit: Payer: Medicare (Managed Care)

## 2023-08-04 ENCOUNTER — Telehealth: Payer: Self-pay | Admitting: Primary Care

## 2023-08-04 NOTE — Telephone Encounter (Signed)
 Refill request    Medication:     oxyCODONE (ROXICODONE) 10 mg immediate release tablet     Confirm that the patient wants the medication sent to:     [facility] [address] [place], [place] - [address] [[id number]]    Additional message:     Relevant labs:    Last Office Visit       Date Provider Department Visit Type Primary Dx    06/02/2023 Viona Gilmore, MD [facility] Visit Parent refuses immunizations            Last Telemedicine Visit       Date Provider Department Visit Type Primary Dx    03/02/2020 Frances Maywood, NP [facility] Telemedicine Visit Lumbar disc herniation          Last PA Office Visit    None          Next visit:            Additional information to be completed by clinical staff:  If the request is for a controlled substance please paste the iStop results below:      **If the refill protocol indicates they are due for labs please order them and tell the patient to have them drawn**

## 2023-08-04 NOTE — Telephone Encounter (Signed)
 I-Stop completed, last filled: 07/10/23  Last office visit: 06/02/2023  Scheduled office visit:

## 2023-08-05 ENCOUNTER — Other Ambulatory Visit: Payer: Self-pay | Admitting: Primary Care

## 2023-08-10 ENCOUNTER — Other Ambulatory Visit: Payer: Self-pay | Admitting: Primary Care

## 2023-08-10 MED ORDER — OXYCODONE HCL 10 MG PO TABS *I*
10.0000 mg | ORAL_TABLET | Freq: Every day | ORAL | 0 refills | Status: DC
Start: 2023-08-10 — End: 2023-09-08

## 2023-08-10 NOTE — Telephone Encounter (Signed)
 Pain medication refilled.  She will need office visit prior to refilling this medicine in the future

## 2023-08-10 NOTE — Telephone Encounter (Signed)
 Patient called asking about prescription. She states that she will be out after tomorrow 08/11/23.

## 2023-08-14 ENCOUNTER — Other Ambulatory Visit: Payer: Self-pay

## 2023-08-15 ENCOUNTER — Ambulatory Visit: Payer: Medicare (Managed Care) | Admitting: Primary Care

## 2023-08-22 ENCOUNTER — Ambulatory Visit: Payer: Medicare (Managed Care) | Admitting: Primary Care

## 2023-08-22 ENCOUNTER — Other Ambulatory Visit: Payer: Self-pay

## 2023-08-22 ENCOUNTER — Encounter: Payer: Self-pay | Admitting: Primary Care

## 2023-08-22 VITALS — BP 126/86 | HR 80 | Temp 97.5°F | Ht 59.0 in | Wt 178.0 lb

## 2023-08-22 DIAGNOSIS — G35 Multiple sclerosis: Secondary | ICD-10-CM

## 2023-08-22 DIAGNOSIS — E785 Hyperlipidemia, unspecified: Secondary | ICD-10-CM

## 2023-08-22 DIAGNOSIS — R32 Unspecified urinary incontinence: Secondary | ICD-10-CM

## 2023-08-22 DIAGNOSIS — R300 Dysuria: Secondary | ICD-10-CM

## 2023-08-22 DIAGNOSIS — I10 Essential (primary) hypertension: Secondary | ICD-10-CM

## 2023-08-22 DIAGNOSIS — M545 Low back pain, unspecified: Secondary | ICD-10-CM

## 2023-08-22 DIAGNOSIS — E039 Hypothyroidism, unspecified: Secondary | ICD-10-CM

## 2023-08-22 LAB — POCT URINALYSIS DIPSTICK
Bilirubin,Ur: NEGATIVE
Blood,UA POCT: NEGATIVE
Glucose,UA POCT: NORMAL mg/dL
Ketones,UA POCT: NEGATIVE mg/dL
Lot #: 78306202
Nitrite,UA POCT: NEGATIVE
PH,UA POCT: 7 (ref 5–8)
Specific gravity,UA POCT: 1.005 (ref 1.002–1.030)
Urobilinogen,UA: NORMAL mg/dL

## 2023-08-22 MED ORDER — RIVAROXABAN 20 MG PO TABS *I*
20.0000 mg | ORAL_TABLET | Freq: Every day | ORAL | 1 refills | Status: DC
Start: 2023-08-22 — End: 2024-02-23

## 2023-08-22 NOTE — Progress Notes (Signed)
 Julia Burgess is a 63 y.o. adult (1960-09-02)    Nurses note:   Follow-up (3 month check up for medication )      CC: Julia Burgess presents today for a follow-up office visit.  Concerned about possible urinary tract infection.  Would like to refill her Xarelto.    HPI: Julia Burgess is a 63 year old female patient who suffers from hypertension, dyslipidemia, hypothyroidism, multiple sclerosis, status post lumbar spine fusion, bilateral hip replacement, osteoporosis, previous fracture of her pelvis, reflux disease.  Her current medicines includes oxycodone, baclofen, omeprazole, levothyroxine, olmesartan, calcium carbonate, Repatha, oxybutynin, Tymlos, gabapentin, atorvastatin, vitamin D, vitamin C, vitamin B12, multivitamins, Marinol and Xarelto.  The patient was seen in the emergency room because of influenza A infection, urinary tract infection and worsening shortness of breath.  Treated with oral antibiotics and antiviral agents.  Advised to continue current medicines.  Unfortunately, Tam still continues to smoke despite repeated requests to stop smoking.  Reviewed her care gaps and find that the patient needs a mammogram and cervical cancer screening.  She was encouraged to obtain the screening studies.  On today's visit, complains of some increased urinary frequency but denies any fevers, chills or night sweats, suprapubic or lower abdominal or flank pains.  A urinalysis done on today's visit shows some trace leukocytes but no elements of an active urinary tract infection.  ROS  As per HPI    Allergies[1]  Current Outpatient Medications   Medication    oxyCODONE (ROXICODONE) 10 mg immediate release tablet    LEVALBUTEROL 45 MCG/ACT inhaler    baclofen 10 mg tablet    clobetasol (TEMOVATE) 0.05 % gel    omeprazole (PRILOSEC) 40 mg capsule    EPINEPHrine (EPIPEN) 0.3 mg/0.3 mL auto-injector    GNP ULTICARE PEN NEEDLES 32G X 6 MM    levothyroxine (SYNTHROID, LEVOTHROID) 88 mcg tablet    naloxone (NARCAN) 4 mg/0.1 mL nasal spray     olmesartan (BENICAR) 20 mg tablet    calcium carbonate 600 MG chewable tablet    evolocumab (REPATHA) 140 mg/mL prefilled syringe    oxybutynin (DITROPAN) 5 mg tablet    albuterol HFA (PROVENTIL, VENTOLIN, PROAIR HFA) 108 (90 Base) MCG/ACT inhaler    abaloparatide (TYMLOS) 3120 MCG/1.56ML injection pen    gabapentin (NEURONTIN) 300 mg capsule    atorvastatin (LIPITOR) 40 mg tablet    psyllium (METAMUCIL) 28.3 % POWD powder    cholecalciferol (VITAMIN D) 1,000 unit tablet    ascorbic acid (VITAMIN C) 100 MG tablet    Cyanocobalamin (VITAMIN B12) 1000 MCG TBCR    Multiple Vitamins-Minerals (CENTRUM SILVER 50+WOMEN) TABS    omeprazole (PRILOSEC OTC) 20 MG tablet    dronabinol (MARINOL) 2.5 MG capsule    rivaroxaban (XARELTO) 20 mg tablet     No current facility-administered medications for this visit.       Past Medical History:   Diagnosis Date    Anxiety disorder 01/29/2021    Arthritis     Asthma     Chronic hypokalemia     Chronic low back pain     Constipation 01/29/2021    COPD (chronic obstructive pulmonary disease)     Depression     DVT (deep venous thrombosis)     Dysphagia     GERD (gastroesophageal reflux disease)     Heart disease     Hypercholesterolemia     Hypertension     Hypothyroidism     Influenza A 07/06/2023    Multiple sclerosis  Multiple sclerosis, primary progressive     Osteoarthritis     Recurrent major depression 01/29/2021    Tobacco abuse     Tongue ulcer     Uncomplicated asthma 01/29/2021    Vitamin D insufficiency 10/17/2022     Social History[2]      Vitals:    08/22/23 1503   BP: 126/86   Pulse: 80   Temp: 36.4 C (97.5 F)   Weight: 80.7 kg (178 lb)   Height: 1.499 m (4\' 11" )     Body mass index is 35.95 kg/m.    Physical Exam   On examination, blood pressure is 126/86 with a pulse of 80 and the patient is afebrile and weighs 178 pounds with a BMI of 35%.  Her head and neck exam shows no oral lesions, thyromegaly, bruits are not 3.  Chest exam shows scattered rhonchi with  decreased breath sounds bilaterally but no rales or wheezes and heart exam is regular rate and rhythm.  Her abdomen is soft without tenderness rebound or guarding and extremity shows no clubbing, cyanosis with trace to 1+ edema of the left lower leg.  Pulses are equal and present bilaterally.    Assessment/Plan    1. Dyslipidemia  History of dyslipidemia.  Will continue atorvastatin and Repatha and encouraged to have a lipid profile.  - Lipid Panel (Reflex to Direct  LDL if Triglycerides more than 400); Future    2. HBP (high blood pressure)  Blood pressure controlled with current medicines.  Encouraged a low-salt diet, check electrolytes renal function  - Comprehensive metabolic panel; Future  - CBC; Future    3. Hypothyroidism, unspecified type  Stable hypothyroidism we will continue current dose of levothyroxine.  Will repeat thyroid function studies  - Comprehensive metabolic panel; Future  - CBC; Future  - TSH; Future    4. Multiple sclerosis  History of multiple sclerosis for which she receives Tymlos on a regular basis and this will be continued.    5. Low back pain  Chronic low back pain.  Known history of previous back surgery.  Patient will continue with oral pain medication    6. Dysuria  Dysuria but no evidence of active urinary tract infection.  Recommend increasing intake of oral fluids.  - POCT urinalysis dipstick    7. Urine incontinence  Incontinence of urine and will increase oxybutynin to twice daily.  May benefit from long-acting oxybutynin in the future.      No follow-ups on file.                           [1]   Allergies  Allergen Reactions    Bee Venom Anaphylaxis    Copaxone [Glatiramer Acetate] Anaphylaxis    Glatiramer Anaphylaxis    Ketorolac Other (See Comments)     Muscle spasms    Meperidine Nausea And Vomiting and Other (See Comments)    Prochlorperazine Other (See Comments)     myoclonic        Tilactase Other (See Comments)     GI upset   [2]   Social History  Socioeconomic History     Marital status: Married   Tobacco Use    Smoking status: Every Day     Packs/day: 0.50     Years: 35.00     Additional pack years: 0.00     Total pack years: 17.50     Types: Cigarettes     Start  date: 04/08/1980     Passive exposure: Current    Smokeless tobacco: Never   Substance and Sexual Activity    Alcohol use: Yes     Alcohol/week: 2.0 - 3.0 standard drinks of alcohol     Types: 2 - 3 Standard drinks or equivalent per week    Drug use: Never    Sexual activity: Not Currently     Partners: Male     Birth control/protection: Post-menopausal

## 2023-09-06 ENCOUNTER — Other Ambulatory Visit: Payer: Self-pay | Admitting: Primary Care

## 2023-09-06 NOTE — Telephone Encounter (Signed)
 Julia Burgess was last seen: 08/22/2023  Next scheduled office visit:

## 2023-09-06 NOTE — Telephone Encounter (Signed)
 I-Stop completed, last filled: 08/10/23  Last office visit: 08/22/2023  Scheduled office visit:

## 2023-09-08 ENCOUNTER — Other Ambulatory Visit: Payer: Self-pay | Admitting: Primary Care

## 2023-09-08 MED ORDER — OXYCODONE HCL 10 MG PO TABS *I*
10.0000 mg | ORAL_TABLET | Freq: Every day | ORAL | 0 refills | Status: DC
Start: 2023-09-08 — End: 2023-10-06

## 2023-09-23 ENCOUNTER — Other Ambulatory Visit: Payer: Self-pay | Admitting: Internal Medicine

## 2023-09-23 ENCOUNTER — Other Ambulatory Visit: Payer: Self-pay | Admitting: Neurology

## 2023-09-23 LAB — COMPREHENSIVE METABOLIC PANEL
ALT: 47 U/L — ABNORMAL HIGH (ref 0–33)
AST: 73 U/L — ABNORMAL HIGH (ref 0–40)
Albumin: 4.2 g/dL (ref 3.5–5.2)
Alk Phos: 96 U/L (ref 35–104)
Anion Gap: 11
Bilirubin,Total: 0.5 mg/dL (ref 0.0–1.0)
CO2: 23 MMOL/L (ref 22–29)
Calcium: 9.9 mg/dL (ref 8.6–10.4)
Chloride: 101 MMOL/L (ref 98–107)
Creatinine: 0.6 mg/dL (ref 0.5–0.9)
Glucose: 122 mg/dL — ABNORMAL HIGH (ref 70–100)
Lab: 6 mg/dL — ABNORMAL LOW (ref 8–23)
Potassium: 5.2 MMOL/L — ABNORMAL HIGH (ref 3.5–5.1)
Sodium: 135 MMOL/L — ABNORMAL LOW (ref 136–145)
Total Protein: 6.6 g/dL (ref 6.6–8.7)
eGFR BY CREAT: 101

## 2023-09-23 LAB — LIPID PANEL
Chol/HDL Ratio: 2.25
Cholesterol: 128 mg/dL (ref 107–200)
HDL: 57 mg/dL (ref 35–86)
LDL Calculated: 47 mg/dL (ref 0–100)
Triglycerides: 121 mg/dL (ref 35–150)

## 2023-09-23 LAB — CBC AND DIFFERENTIAL
Baso # K/uL: 0.1 10*3/uL (ref 0.0–0.2)
Basophil %: 1 % (ref 0–2)
Eos # K/uL: 0.3 10*3/uL (ref 0.0–0.7)
Eosinophil %: 3 % (ref 0–8)
Hematocrit: 42.7 % (ref 37.5–47.7)
Hemoglobin: 13.9 g/dL (ref 12.1–15.8)
Immature Granulocytes Absolute: 0.03 10*3/uL (ref 0.0–0.031)
Immature Granulocytes: 0.3 % (ref 0.0–0.4)
Lymph # K/uL: 1.9 10*3/uL (ref 1.0–5.2)
Lymphocyte %: 22 % (ref 13–42)
MCH: 32.7 pg (ref 26.4–33.6)
MCHC: 32.6 g/dL (ref 31.9–37.3)
MCV: 101 fL — ABNORMAL HIGH (ref 80–93)
Mean Platelet Volume: 8.2 fL — ABNORMAL LOW (ref 10.1–10.4)
Mono # K/uL: 0.7 10*3/uL (ref 0.3–1.1)
Monocyte %: 8 % (ref 2–12)
Neut # K/uL: 5.8 10*3/uL (ref 2.1–8.0)
Nucl RBC # K/uL: 0 10*3/uL (ref 0.0–0.012)
Nucl RBC %: 0 % (ref 0.0–0.2)
Platelets: 487 10*3 — ABNORMAL HIGH (ref 144–366)
RBC Distribution Width-SD: 53.3 fL — ABNORMAL HIGH (ref 41.0–45.3)
RBC: 4.25 10*6 (ref 4.16–5.34)
RDW: 14.6 % — ABNORMAL HIGH (ref 12.8–14.2)
Seg Neut %: 66 % (ref 44–79)
WBC: 8.85 10*3 (ref 4.3–11.0)

## 2023-09-23 LAB — BILIRUBIN, DIRECT: Bilirubin,Direct: 0.2 mg/dL (ref 0.0–0.3)

## 2023-09-23 LAB — TSH: TSH: 0.88 u[IU]/mL (ref 0.27–4.20)

## 2023-09-25 LAB — CBC
Hematocrit: 42.7 % (ref 37.5–47.7)
Hemoglobin: 13.9 g/dL (ref 12.1–15.8)
MCH: 32.7 pg (ref 26.4–33.6)
MCHC: 32.6 g/dL (ref 31.9–37.3)
MCV: 101 fL — ABNORMAL HIGH (ref 80–93)
Mean Platelet Volume: 8.2 fL — ABNORMAL LOW (ref 10.1–10.4)
Nucl RBC # K/uL: 0 10*3/uL (ref 0.0–0.012)
Nucl RBC %: 0 % (ref 0.0–0.2)
Platelets: 487 10*3 — ABNORMAL HIGH (ref 144–366)
RBC Distribution Width-SD: 53.3 fL — ABNORMAL HIGH (ref 41.0–45.3)
RBC: 4.25 10*6 (ref 4.16–5.34)
RDW: 14.6 % — ABNORMAL HIGH (ref 12.8–14.2)
WBC: 8.85 10*3 (ref 4.3–11.0)

## 2023-10-06 ENCOUNTER — Other Ambulatory Visit: Payer: Self-pay | Admitting: Primary Care

## 2023-10-06 MED ORDER — OXYCODONE HCL 10 MG PO TABS *I*
10.0000 mg | ORAL_TABLET | Freq: Every day | ORAL | 0 refills | Status: DC
Start: 2023-10-06 — End: 2023-11-03

## 2023-10-06 NOTE — Telephone Encounter (Signed)
 I-Stop completed, last filled: 09/08/23  Last office visit: 08/22/2023  Scheduled office visit: Future Encounters      10/16/2023  3:20 PM Sch    FOLLOW UP VISIT    PCW    Persaud, Aisha Hove, MD

## 2023-10-06 NOTE — Telephone Encounter (Signed)
 Julia Burgess was last seen: 08/22/2023  Next scheduled office visit: Future Encounters      10/16/2023  3:20 PM Sch    FOLLOW UP VISIT    PCW    Persaud, Aisha Hove, MD

## 2023-10-11 ENCOUNTER — Other Ambulatory Visit: Payer: Self-pay | Admitting: Primary Care

## 2023-10-11 MED ORDER — ATORVASTATIN CALCIUM 40 MG PO TABS *I*
40.0000 mg | ORAL_TABLET | Freq: Every evening | ORAL | 3 refills | Status: AC
Start: 2023-10-11 — End: ?

## 2023-10-11 NOTE — Telephone Encounter (Signed)
 Patient is included in a value based care contract between [facility] and [employer]. Receive notification from [facility] that they are overdue for a refill of atorvastatin. Spoke with patient who shared her spouse had some leftover which she has been trying to use up. She is switching pharmacies and is in need of a refill so I have pended a new script.    She is also interested in switching Repatha to [facility] for free home delivery. I have reached out to the specialty pharmacy to begin the process of coordinating with her cardiologist.     Lurlean Salmons, PharmD, MS-CI, [facility]  Clinical Pharmacy Specialist  [facility]  [phone]

## 2023-10-12 ENCOUNTER — Other Ambulatory Visit: Payer: Self-pay | Admitting: Primary Care

## 2023-10-12 NOTE — Telephone Encounter (Signed)
\    Last office visit: 08/22/2023  Scheduled office visit: Future Encounters      10/16/2023  3:20 PM [facility]    FOLLOW UP VISIT    PCW    Persaud, Aisha Hove, MD

## 2023-10-16 ENCOUNTER — Ambulatory Visit: Payer: Medicare (Managed Care) | Admitting: Primary Care

## 2023-10-19 DIAGNOSIS — E875 Hyperkalemia: Secondary | ICD-10-CM | POA: Insufficient documentation

## 2023-10-31 ENCOUNTER — Encounter: Payer: Self-pay | Admitting: Primary Care

## 2023-10-31 ENCOUNTER — Other Ambulatory Visit: Payer: Self-pay

## 2023-10-31 ENCOUNTER — Ambulatory Visit: Payer: Medicare (Managed Care) | Admitting: Primary Care

## 2023-10-31 VITALS — BP 108/78 | HR 84 | Temp 97.7°F | Ht 59.0 in | Wt 168.0 lb

## 2023-10-31 DIAGNOSIS — L570 Actinic keratosis: Secondary | ICD-10-CM

## 2023-10-31 DIAGNOSIS — G35 Multiple sclerosis: Secondary | ICD-10-CM

## 2023-10-31 DIAGNOSIS — J449 Chronic obstructive pulmonary disease, unspecified: Secondary | ICD-10-CM

## 2023-10-31 DIAGNOSIS — E669 Obesity, unspecified: Secondary | ICD-10-CM

## 2023-10-31 DIAGNOSIS — E039 Hypothyroidism, unspecified: Secondary | ICD-10-CM

## 2023-10-31 DIAGNOSIS — E785 Hyperlipidemia, unspecified: Secondary | ICD-10-CM

## 2023-10-31 DIAGNOSIS — I10 Essential (primary) hypertension: Secondary | ICD-10-CM

## 2023-10-31 DIAGNOSIS — H6123 Impacted cerumen, bilateral: Secondary | ICD-10-CM

## 2023-10-31 DIAGNOSIS — E119 Type 2 diabetes mellitus without complications: Secondary | ICD-10-CM

## 2023-10-31 NOTE — Progress Notes (Addendum)
 Julia Burgess is a 63 y.o. adult ([birth year])    Nurses note:   Skin Problem (Spot on the left side of the forehead )      CC: Julia Burgess presents today with concerns to's spots that her left temple and forehead area.  The patient is also requesting ear flushes because of decreased hearing.    HPI: Julia Burgess is a 63 year old female patient.  History noted for hypertension, dyslipidemia, mild intermittent asthma, chronic tobacco use disorder, multiple sclerosis, spinal stenosis of the lumbar spine and is status post lumbar surgery.  Patient is also status post total left hip arthroplasty with incomplete healing, osteoporosis, osteoarthritis, reflux disease and previous stress fracture of the sacrum.  Unfortunately, Julia Burgess continues to smoke approximately 1 pack/day despite repeated request to stop smoking.  I reviewed her care gaps and find that she needs a cervical cancer screening and updated mammography.  Current medicines includes Repatha, levalbuterol , atorvastatin , rivaroxaban , baclofen, omeprazole , levothyroxine , olmesartan , oxybutynin, calcium  carbonate, abaloparatide, gabapentin, vitamin D , vitamin C, vitamin B12, omeprazole  and dronabinol.  Reviewed her recent blood test result that shows a normal thyroid  function studies.  She had a slightly reduced serum sodium level, potassium of 5.2 and a fasting glucose of 122 mg/dL with a slight elevated liver transaminases.  Her lipid profiles appear to be acceptable.  She denies any shortness of breath, chest pains, headaches, lightheadedness or dizziness, nausea, vomiting, diarrhea constipation or abdominal pains.  No fevers, chills or night sweat.  She had admits to chronic low back pain and diffuse muscle pains.  Admits to some weakness of her lower extremities.  She is concerned about some spots to her left forehead and temple areas.  She would like her ears to be flushed because of decreased hearing.  ROS  As per HPI        Lab results: 09/23/23  1132   TSH 0.88          Lab results: 09/23/23  1132   Sodium 135*   Potassium 5.2*   Chloride 101   CO2 23   UN 6*   Creatinine 0.6   Glucose 122*   Calcium  9.9   Total Protein 6.6   Albumin 4.2   ALT 47*   AST 73*   Alk Phos 96   Bilirubin,Total 0.5         Lab results: 09/23/23  1132   Cholesterol 128   HDL 57   LDL Calculated 47   Triglycerides 121   Chol/HDL Ratio 2.25     No components found with this basename: NHLDC      Allergies[1]  Current Outpatient Medications   Medication    REPATHA SURECLICK 140 MG/ML pen    LEVALBUTEROL  45 MCG/ACT inhaler    atorvastatin  (LIPITOR) 40 mg tablet    oxyCODONE  (ROXICODONE ) 10 mg immediate release tablet    rivaroxaban  (XARELTO ) 20 mg tablet    baclofen 10 mg tablet    clobetasol  (TEMOVATE ) 0.05 % gel    omeprazole  (PRILOSEC) 40 mg capsule    EPINEPHrine (EPIPEN) 0.3 mg/0.3 mL auto-injector    GNP ULTICARE PEN NEEDLES 32G X 6 MM    levothyroxine  (SYNTHROID , LEVOTHROID) 88 mcg tablet    naloxone  (NARCAN ) 4 mg/0.1 mL nasal spray    olmesartan  (BENICAR ) 20 mg tablet    calcium  carbonate 600 MG chewable tablet    oxybutynin (DITROPAN) 5 mg tablet    albuterol  HFA (PROVENTIL , VENTOLIN , PROAIR  HFA) 108 (90 Base) MCG/ACT inhaler    abaloparatide (  TYMLOS) 3120 MCG/1.56ML injection pen    gabapentin (NEURONTIN) 300 mg capsule    psyllium (METAMUCIL) 28.3 % POWD powder    cholecalciferol (VITAMIN D ) 1,000 unit tablet    ascorbic acid (VITAMIN C) 100 MG tablet    Cyanocobalamin  (VITAMIN B12) 1000 MCG TBCR    Multiple Vitamins-Minerals (CENTRUM SILVER  50+WOMEN) TABS    omeprazole  (PRILOSEC OTC) 20 MG tablet    dronabinol (MARINOL) 2.5 MG capsule     No current facility-administered medications for this visit.       Past Medical History:   Diagnosis Date    Anxiety disorder 01/29/2021    Arthritis     Asthma     Chronic hypokalemia     Chronic low back pain     Constipation 01/29/2021    COPD (chronic obstructive pulmonary disease)     Depression     DVT (deep venous thrombosis)     Dysphagia     GERD  (gastroesophageal reflux disease)     Heart disease     Hypercholesterolemia     Hypertension     Hypothyroidism     Influenza A 07/06/2023    Multiple sclerosis     Multiple sclerosis, primary progressive     Osteoarthritis     Recurrent major depression 01/29/2021    Tobacco abuse     Tongue ulcer     Uncomplicated asthma 01/29/2021    Vitamin D  insufficiency 10/17/2022     Social History[2]      Vitals:    10/31/23 1550   BP: 108/78   Pulse: 84   Temp: 36.5 C (97.7 F)   Weight: 76.2 kg (168 lb)   Height: 1.499 m (4' 11)     Body mass index is 33.93 kg/m.    Physical Exam   On examination, blood pressure is 108/78 with a pulse of 84 the patient is afebrile and weighs are 68 pounds with a BMI of 33%.  Head and neck examination shows no oral lesion or thyromegaly or bruits or adenopathy.  Chest shows decreased but clear breath sounds with some scattered rhonchi but no rales or wheezes and heart exam is regular rate and rhythm.  Her abdomen is obese but soft and nontender and extremity shows no edema.  No visible skin rashes are noted.  Skin exam is noted for 2 or 3 small less than 1 cm hyperpigmented macular tan color lesions to her temple areas.  No drainage or discharge.  No suspicious lesion for skin cancer.    Assessment/Plan    1. Keratosis  Suspected having seborrheic keratosis.  She was reassured regarding this lesions.  Urged patient to seek dermatology if lesions appear to worsen.    2. HBP (high blood pressure)  History of high blood pressure.  Blood pressure appears to be well-controlled with current medication.  Will recommend a low-salt diet.    3. Dyslipidemia  Stable dyslipidemia continue Repatha and low-cholesterol diet    4. Hypothyroidism, unspecified type  Stable hypothyroidism we will continue with levothyroxine .    5. Multiple sclerosis  Slow remitting multiple sclerosis.  Continue with Tymlos    6. Bilateral hearing loss due to cerumen impaction  Bilateral hearing loss due to cerumen  impaction.  Both ears were flushed in today's visit.  - Ear irrigation    7. DM (diabetes mellitus)  History of well-controlled diabetes mellitus without medications.  Encourage a low carbohydrate low sugar diet..    8. Chronic obstructive pulmonary  disease, unspecified COPD type  History of COPD but controlled with current medications.  Recommend patient cut back and quit smoking.  Offered smoking cessation therapy but not interested.  She would like to reduce and quit use of cigarette on her own.  She continue with beta agonist.    9. Obesity, unspecified class, unspecified obesity type, unspecified whether serious comorbidity present  Patient suffers from obesity.  Cannot do meaningful exercise because of her underlying neurological issues.  Urged her to cut back on calorie intake in order to lose weight.    No follow-ups on file.                           [1]   Allergies  Allergen Reactions    Bee Venom Anaphylaxis    Copaxone [Glatiramer Acetate] Anaphylaxis    Glatiramer Anaphylaxis    Ketorolac Other (See Comments)     Muscle spasms    Meperidine Nausea And Vomiting and Other (See Comments)    Prochlorperazine Other (See Comments)     myoclonic        Tilactase Other (See Comments)     GI upset   [2]   Social History  Socioeconomic History    Marital status: Married   Tobacco Use    Smoking status: Every Day     Packs/day: 0.50     Years: 35.00     Additional pack years: 0.00     Total pack years: 17.50     Types: Cigarettes     Start date: 04/08/1980     Passive exposure: Current    Smokeless tobacco: Never   Substance and Sexual Activity    Alcohol use: Yes     Alcohol/week: 2.0 - 3.0 standard drinks of alcohol     Types: 2 - 3 Standard drinks or equivalent per week    Drug use: Never    Sexual activity: Not Currently     Partners: Male     Birth control/protection: Post-menopausal

## 2023-11-03 ENCOUNTER — Other Ambulatory Visit: Payer: Self-pay | Admitting: Primary Care

## 2023-11-03 NOTE — Telephone Encounter (Signed)
 Julia Burgess was last seen: 10/31/2023  Next scheduled office visit:

## 2023-11-03 NOTE — Telephone Encounter (Signed)
 I-Stop completed, last filled: 10/07/23  Last office visit: 10/31/2023  Scheduled office visit:

## 2023-11-06 MED ORDER — OXYCODONE HCL 10 MG PO TABS *I*
10.0000 mg | ORAL_TABLET | Freq: Every day | ORAL | 0 refills | Status: DC
Start: 2023-11-06 — End: 2023-12-04

## 2023-11-06 NOTE — Telephone Encounter (Signed)
 Julia Burgess is calling to find out why her script has not been sent in?

## 2023-12-04 ENCOUNTER — Other Ambulatory Visit: Payer: Self-pay | Admitting: Primary Care

## 2023-12-04 MED ORDER — OXYCODONE HCL 10 MG PO TABS *I*
10.0000 mg | ORAL_TABLET | Freq: Every day | ORAL | 0 refills | Status: DC
Start: 2023-12-04 — End: 2024-01-01

## 2023-12-04 NOTE — Telephone Encounter (Signed)
 Baljit was last seen: 10/31/2023  Next scheduled office visit:

## 2023-12-04 NOTE — Telephone Encounter (Signed)
 I-Stop completed, last filled: 11/06/2023  Last office visit: 10/31/2023  Scheduled office visit:

## 2023-12-11 ENCOUNTER — Other Ambulatory Visit: Payer: Self-pay | Admitting: Gastroenterology

## 2023-12-11 ENCOUNTER — Other Ambulatory Visit: Payer: Self-pay

## 2023-12-11 ENCOUNTER — Telehealth: Payer: Self-pay | Admitting: Primary Care

## 2023-12-11 DIAGNOSIS — F17210 Nicotine dependence, cigarettes, uncomplicated: Secondary | ICD-10-CM | POA: Insufficient documentation

## 2023-12-11 DIAGNOSIS — F1120 Opioid dependence, uncomplicated: Secondary | ICD-10-CM | POA: Insufficient documentation

## 2023-12-11 DIAGNOSIS — J441 Chronic obstructive pulmonary disease with (acute) exacerbation: Secondary | ICD-10-CM | POA: Insufficient documentation

## 2023-12-11 LAB — BASIC METABOLIC PANEL
Anion Gap: 11 mmol/L (ref 3–18)
CO2: 22 meq/L (ref 22–31)
Calcium: 8.7 mg/dL (ref 8.4–10.0)
Chloride: 99 mmol/L — ABNORMAL LOW (ref 100–110)
Creatinine: 0.7 mg/dL — ABNORMAL LOW (ref 0.8–1.3)
Glucose: 161 mg/dL — ABNORMAL HIGH (ref 61–100)
Lab: 10 mg/dL (ref 7–21)
Potassium: 4.2 mmol/L (ref 3.5–5.2)
Sodium: 132 mmol/L — ABNORMAL LOW (ref 136–145)

## 2023-12-11 LAB — CBC AND DIFFERENTIAL
Baso # K/uL: 0.1 10*3/uL (ref 0.0–0.1)
Basophil %: 0 % (ref 0–3)
Eos # K/uL: 1 10*3/uL — ABNORMAL HIGH (ref 0.0–0.8)
Eosinophil %: 6 % — ABNORMAL HIGH (ref 0–5)
Hematocrit: 37 % — ABNORMAL LOW (ref 38–48)
Hemoglobin: 13.2 g/dL (ref 11.9–15.9)
Lymph # K/uL: 1 10*3/uL (ref 0.6–3.3)
Lymphocyte %: 6 % — ABNORMAL LOW (ref 15–45)
MCH: 33 pg — ABNORMAL HIGH (ref 26.6–30.6)
MCHC: 35.4 g/dL — ABNORMAL HIGH (ref 31.1–34.5)
MCV: 93 fL (ref 80–97)
Mean Platelet Volume: 8.7 fL — ABNORMAL LOW (ref 9.0–12.2)
Mono # K/uL: 0.8 10*3/uL (ref 0.1–1.1)
Monocyte %: 5 % (ref 0–15)
Neut # K/uL: 13.5 10*3/uL — ABNORMAL HIGH (ref 2.0–7.1)
Platelets: 323 10*3/uL (ref 150–450)
RBC: 4 10*6/uL — ABNORMAL LOW (ref 4.04–5.48)
RDW: 13.6 % (ref 12.9–16.3)
Seg Neut %: 82 % — ABNORMAL HIGH (ref 45–75)
WBC: 16.6 10*3/uL — ABNORMAL HIGH (ref 4.8–10.4)

## 2023-12-11 LAB — UNMAPPED LAB RESULTS
Basophil # (HT): 0.1 10 3/uL (ref 0.0–0.1)
Basophil % (HT): 0 % (ref 0–3)
Eosinophil # (HT): 1 10 3/uL — ABNORMAL HIGH (ref 0.0–0.8)
Eosinophil % (HT): 6 % — ABNORMAL HIGH (ref 0–5)
Hematocrit (HT): 37 % — ABNORMAL LOW (ref 38–48)
Hemoglobin (HGB) (HT): 13.2 g/dL (ref 11.9–15.9)
Lymphocyte # (HT): 1 10 3/uL (ref 0.6–3.3)
Lymphocyte % (HT): 6 % — ABNORMAL LOW (ref 15–45)
MCHC (HT): 35.4 g/dL — ABNORMAL HIGH (ref 31.1–34.5)
MCV (HT): 93 fL (ref 80–97)
Mean Corpuscular Hemoglobin (MCH) (HT): 33 pg — ABNORMAL HIGH (ref 26.6–30.6)
Mean Platelet Volume (HT): 8.7 fL — ABNORMAL LOW (ref 9.0–12.2)
Monocyte # (HT): 0.8 10 3/uL (ref 0.1–1.1)
Monocyte % (HT): 5 % (ref 0–15)
Neutrophil # (HT): 13.5 10 3/uL — ABNORMAL HIGH (ref 2.0–7.1)
Platelets (HT): 323 10 3/uL (ref 150–450)
RBC (HT): 4 10 6/uL — ABNORMAL LOW (ref 4.04–5.48)
RDW (HT): 13.6 % (ref 12.9–16.3)
Seg Neut % (HT): 82 % — ABNORMAL HIGH (ref 45–75)
WBC (HT): 16.6 10 3/uL — ABNORMAL HIGH (ref 4.8–10.4)

## 2023-12-11 LAB — INFLUENZA  A & B/RSV PCR
Influenza A PCR: NOT DETECTED
Influenza B PCR: NOT DETECTED
RSV PCR: NOT DETECTED

## 2023-12-11 LAB — TROPONIN I, HS, 0 HR: TROPONIN I, HS, 0 HR: 4 pg/mL (ref 0–50)

## 2023-12-11 LAB — COVID-19 NAAT (PCR): COVID-19 NAAT (PCR): NOT DETECTED

## 2023-12-11 LAB — NT-PRO BNP: B TYPE NATRIURETIC PEPTIDE (BNP): 23 pg/mL (ref 0–99)

## 2023-12-11 LAB — EGFR CKD-EPI REFIT: eCFR CKD-EPI REFIT: >60 mL/min

## 2023-12-11 LAB — TROPONIN I, HIGH SENS., 1 HR
TROP T 0-1 HR DELTA High Sensitivity: 0 pg/mL (ref 0–9)
TROPONIN I, HS. 1 HR: 4 pg/mL (ref 0–50)

## 2023-12-11 LAB — MAGNESIUM: Magnesium: 1.8 mg/dL (ref 1.7–2.2)

## 2023-12-11 NOTE — Telephone Encounter (Signed)
 She has a summer cold, coughing, congestion and shortness of breath.  She wants to know if you would send in an inhaler  .  She has not had one in awhile.  I told her should would have to come in and she said she can't drive that far and would probably go to urgent care. But she wanted me to put a note back to you anyway.

## 2023-12-12 ENCOUNTER — Ambulatory Visit: Payer: Self-pay | Admitting: Primary Care

## 2023-12-12 ENCOUNTER — Other Ambulatory Visit: Payer: Self-pay | Admitting: Primary Care

## 2023-12-12 LAB — BASIC METABOLIC PANEL
Anion Gap: 11 mmol/L (ref 3–18)
CO2: 22 meq/L (ref 22–31)
Calcium: 8.9 mg/dL (ref 8.4–10.0)
Chloride: 99 mmol/L — ABNORMAL LOW (ref 100–110)
Creatinine: 0.6 mg/dL — ABNORMAL LOW (ref 0.8–1.3)
Glucose: 143 mg/dL — ABNORMAL HIGH (ref 61–100)
Lab: 11 mg/dL (ref 7–21)
Potassium: 3.9 mmol/L (ref 3.5–5.2)
Sodium: 132 mmol/L — ABNORMAL LOW (ref 136–145)

## 2023-12-12 LAB — EGFR CKD-EPI REFIT: eCFR CKD-EPI REFIT: >60 mL/min

## 2023-12-12 LAB — CBC
Hematocrit: 38 % (ref 38–48)
Hemoglobin: 13.4 g/dL (ref 11.9–15.9)
MCH: 32.9 pg — ABNORMAL HIGH (ref 26.6–30.6)
MCHC: 35.2 g/dL — ABNORMAL HIGH (ref 31.1–34.5)
MCV: 94 fL (ref 80–97)
Mean Platelet Volume: 8.5 fL — ABNORMAL LOW (ref 9.0–12.2)
Platelets: 328 10*3/uL (ref 150–450)
RBC: 4.07 10*6/uL (ref 4.04–5.48)
RDW: 13.8 % (ref 12.9–16.3)
WBC: 17.7 10*3/uL — ABNORMAL HIGH (ref 4.8–10.4)

## 2023-12-12 LAB — T4, FREE: Free T4: 1.3 ng/dL (ref 0.6–1.4)

## 2023-12-12 LAB — UNMAPPED LAB RESULTS
Hematocrit (HT): 38 % (ref 38–48)
Hemoglobin (HGB) (HT): 13.4 g/dL (ref 11.9–15.9)
MCHC (HT): 35.2 g/dL — ABNORMAL HIGH (ref 31.1–34.5)
MCV (HT): 94 fL (ref 80–97)
Mean Corpuscular Hemoglobin (MCH) (HT): 32.9 pg — ABNORMAL HIGH (ref 26.6–30.6)
Mean Platelet Volume (HT): 8.5 fL — ABNORMAL LOW (ref 9.0–12.2)
Platelets (HT): 328 10 3/uL (ref 150–450)
RBC (HT): 4.07 10 6/uL (ref 4.04–5.48)
RDW (HT): 13.8 % (ref 12.9–16.3)
WBC (HT): 17.7 10 3/uL — ABNORMAL HIGH (ref 4.8–10.4)

## 2023-12-12 LAB — TSH: TSH: 0.26 u[IU]/mL — ABNORMAL LOW (ref 0.30–4.90)

## 2023-12-12 MED ORDER — LEVOTHYROXINE SODIUM 75 MCG PO TABS *I*: 75.0000 ug | ORAL_TABLET | Freq: Every day | ORAL | 1 refills | Status: DC

## 2023-12-13 LAB — UNMAPPED LAB RESULTS
Basophil # (HT): 0.1 10 3/uL (ref 0.0–0.1)
Basophil % (HT): 0 % (ref 0–3)
Eosinophil # (HT): 1.2 10 3/uL — ABNORMAL HIGH (ref 0.0–0.8)
Eosinophil % (HT): 7 % — ABNORMAL HIGH (ref 0–5)
Hematocrit (HT): 35 % — ABNORMAL LOW (ref 38–48)
Hemoglobin (HGB) (HT): 12.3 g/dL (ref 11.9–15.9)
Lymphocyte # (HT): 2.9 10 3/uL (ref 0.6–3.3)
Lymphocyte % (HT): 17 % (ref 15–45)
MCHC (HT): 35.1 g/dL — ABNORMAL HIGH (ref 31.1–34.5)
MCV (HT): 92 fL (ref 80–97)
Mean Corpuscular Hemoglobin (MCH) (HT): 32.2 pg — ABNORMAL HIGH (ref 26.6–30.6)
Mean Platelet Volume (HT): 8.6 fL — ABNORMAL LOW (ref 9.0–12.2)
Monocyte # (HT): 1.2 10 3/uL — ABNORMAL HIGH (ref 0.1–1.1)
Monocyte % (HT): 7 % (ref 0–15)
Neutrophil # (HT): 11.9 10 3/uL — ABNORMAL HIGH (ref 2.0–7.1)
Platelets (HT): 366 10 3/uL (ref 150–450)
RBC (HT): 3.82 10 6/uL — ABNORMAL LOW (ref 4.04–5.48)
RDW (HT): 13.6 % (ref 12.9–16.3)
Seg Neut % (HT): 69 % (ref 45–75)
WBC (HT): 17.4 10 3/uL — ABNORMAL HIGH (ref 4.8–10.4)

## 2023-12-13 NOTE — Telephone Encounter (Signed)
 Unable to reach Julia Burgess and cannot leave a message because her voice mailbox is full.

## 2023-12-13 NOTE — Telephone Encounter (Signed)
 Patient needs to be seen.  She is a smoker and does have significant smokers lung disease.  Urgent care close to where she is living would be appropriate also.

## 2023-12-14 LAB — UNMAPPED LAB RESULTS
Basophil # (HT): 0.1 10 3/uL (ref 0.0–0.1)
Basophil % (HT): 1 % (ref 0–3)
Eosinophil # (HT): 1.2 10 3/uL — ABNORMAL HIGH (ref 0.0–0.8)
Eosinophil % (HT): 9 % — ABNORMAL HIGH (ref 0–5)
Hematocrit (HT): 38 % (ref 38–48)
Hemoglobin (HGB) (HT): 13.1 g/dL (ref 11.9–15.9)
Lymphocyte # (HT): 3.3 10 3/uL (ref 0.6–3.3)
Lymphocyte % (HT): 26 % (ref 15–45)
MCHC (HT): 34.7 g/dL — ABNORMAL HIGH (ref 31.1–34.5)
MCV (HT): 93 fL (ref 80–97)
Mean Corpuscular Hemoglobin (MCH) (HT): 32.3 pg — ABNORMAL HIGH (ref 26.6–30.6)
Mean Platelet Volume (HT): 8.4 fL — ABNORMAL LOW (ref 9.0–12.2)
Monocyte # (HT): 1.1 10 3/uL (ref 0.1–1.1)
Monocyte % (HT): 9 % (ref 0–15)
Neutrophil # (HT): 6.9 10 3/uL (ref 2.0–7.1)
Platelets (HT): 404 10 3/uL (ref 150–450)
RBC (HT): 4.05 10 6/uL (ref 4.04–5.48)
RDW (HT): 13.7 % (ref 12.9–16.3)
Seg Neut % (HT): 55 % (ref 45–75)
WBC (HT): 12.6 10 3/uL — ABNORMAL HIGH (ref 4.8–10.4)

## 2023-12-15 ENCOUNTER — Other Ambulatory Visit: Payer: Self-pay

## 2023-12-15 NOTE — Progress Notes (Signed)
 Out of System Transitions Care Management Documentation:(copied from hospital summary)   Hospital Admission Date/Discharge Date: 12/11/2023 3:31 PM EDT - 12/14/2023 4:33 PM EDT  Discharge Diagnosis at the time of discharge:   COPD with acute exacerbation (CMS HCC Code) (Primary Dx); Essential hypertension; Dyslipidemia; Uncomplicated opioid dependence (CMS HCC Code); Mild intermittent asthma without complication; Other chronic pain; Multiple sclerosis (CMS HCC Code); Clotting disorder (CMS HCC Code); Hypothyroidism (acquired); Cigarette nicotine  dependence without complication; Hyponatremia Hospital Area Discharged From: [facility] [address] [place], [place] [address] [phone]  Discharge Disposition:  homeCourse of Hospital Stay:    Discharge Instructions for Jenasia Dolinar for HospitalizationCOPD/asthma exacerbation, LLL pneumoniaMy To Do List:Continue DuoNebs for at least the next week, may discontinue early if feeling less shortness of breath and decreased respiratory complaints. Can use as needed Xopenex  for shortness of breathUse as needed Tessalon and guaifenesin for coughingComplete course of oral Vantin, doxycycline , prednisone.Follow-up respiratory cultures with PCP/pulmonologyContinue probioticsNicotine replacementFollow-up with PCP and pulmonology outpatientTest results that are still pending: -- Pending tests from hospital stay Order Current Status Legionella Culture In process Respiratory Culture (C&S) And Gram Stain In process I should talk to Dr. Janine Fillers, MD to obtain these test results and discuss any questions.Physical Activity:In general after a hospitalization you should plan to slowly increase your activity level over the course of several days to weeks as your condition continues to improve. You may feel tired for a week or even longer  depending upon the severity of your illness. DietDiet orders: Diet heart healthyYour doctor recommends keeping your weight at an ideal level to improve your overall health. You may discuss details of your diet with Janine Fillers, MD or with a dietician (call [phone] for more information)Oxygen Orders: No Supplemental Oxygen NeededImportant Testing While I was in the Hospital:CXR, CBC, BMP, MRSA, COVID/flu/RSV, Legionella, sputum culture, EKG, troponins, viral atypical pneumonia panelMy Hospital Doctor: Mal Albany, MDMedications: New, Changed, or Stopped:  Medications at Time of Discharge- documented as of this encounter Medications at Time of DischargeMedication Sig Dispense Quantity Refills Last Filled Start Date End Date ascorbic acid, vitamin C, (VITAMIN C) 100 MG tablet  Take 1 tablet by mouth daily.           atorvastatin  20 MG Oral tablet  Take 2 tablets by mouth daily.           baclofen 10 MG Oral tablet  Take 1 tablet by mouth 2 (two) times daily.           furosemide 20 MG Oral tablet  Take 1 tablet by mouth daily.           gabapentin 300 MG Oral capsule  Take 3 capsules by mouth 3 (three) times daily.           lactase 9,000 unit Oral Tab Indications: lactose intolerance Take 1 tablet by mouth 3 (three) times daily with meals. Indications: inability of body to handle lactose or milk sugar 90 tablet      02/05/2020   LEVOTHYROXINE  88 MCG Oral tablet Indications: Hypothyroidism (acquired) TAKE 1 TABLET BY MOUTH ONCE A DAY 90 tablet  3   01/16/2023   olmesartan  20 MG Oral tablet  Take 1 tablet by mouth daily.           oxybutynin 5 MG Oral tablet  Take 1 tablet by mouth 2 (two) times daily.           oxyCODONE  10 mg Oral immediate release tablet Indications: Orthopedic  aftercare Take 1.5 tablets by mouth every 6 (six) hours as needed for Pain. Max Daily Amount: 60 mg 30 tablet      03/02/2021   psyllium husk (METAMUCIL ORAL)   Take by mouth. 2 teaspoons daily           REPATHA SURECLICK 140 mg/mL SubQ PnIj injection  Inject 1 mL into the skin every 14 (fourteen) days.       02/10/2022   rivaroxaban  (XARELTO ) 20 mg Oral Tab tablet Indications: Anticoagulant long-term use Take 1 tablet by mouth daily with dinner. 30 tablet  5   12/03/2019   XOPENEX  HFA 45 mcg/actuation Inhl inhaler Indications: Uncomplicated asthma, unspecified asthma severity, unspecified whether persistent INHALE 2 PUFFS INTO LUNGS EVERY 4 TO 6 HOURS AS NEEDED 1 Inhaler      09/15/2017   benzonatate 200 MG Oral capsule Indications: COPD with acute exacerbation (CMS HCC Code) Take 1 capsule by mouth 3 (three) times daily as needed for Cough. 42 capsule      12/14/2023   calcium  carbonate (TUMS) 200 mg calcium  (500 mg) Oral chewable tablet Indications: Other osteoporosis without current pathological fracture Take 1 tablet by mouth 2 (two) times daily. 60 tablet  5   03/27/2020   cefpodoxime 200 MG Oral tablet Indications: COPD with acute exacerbation (CMS HCC Code) Take 1 tablet by mouth 2 (two) times daily. 14 tablet      12/14/2023   doxycycline  100 MG Oral capsule Indications: COPD with acute exacerbation (CMS HCC Code) Take 1 capsule by mouth 2 (two) times daily. 14 capsule      12/14/2023   guaiFENesin 600 mg Oral 12 hr EXT REL tab Indications: COPD with acute exacerbation (CMS HCC Code) Take 1 tablet by mouth 2 (two) times daily. 30 tablet      12/14/2023   ipratropium-albuteroL  0.5 mg-3 mg(2.5 mg base)/3 mL Inhl nebulizer solution Indications: COPD with acute exacerbation (CMS HCC Code) Take 3 mLs by nebulization 4 (four) times daily. 28 each      12/14/2023   mecobalamin (B12 ACTIVE ORAL)  Take by mouth.           multivit-min/iron/folic/lutein (CENTRUM SILVER  WOMEN ORAL)  Take by mouth. Takes two daily           nebulizer accessories (ALL FLOW 3000 KIT) Misc Misc Indications: COPD with acute exacerbation (CMS HCC Code) 1  Units by Misc.(Non-Drug; Combo Route) route 4 (four) times daily. 1 each      12/14/2023   nicotine  14 mg/24 hr TD Indications: Cigarette nicotine  dependence without complication Place 1 patch onto the skin daily. 30 patch  1   12/14/2023   pen needle, diabetic (BD ULTRA-FINE MINI PEN NEEDLE) 31 gauge x 3/16 Misc Ndle Indications: Osteoporosis, senile 1 each by Misc.(Non-Drug; Combo Route) route daily. 100 each  3   07/24/2023   predniSONE 20 MG Oral tablet Indications: COPD with acute exacerbation (CMS HCC Code) Take 2 tablets by mouth daily. 6 tablet      12/15/2023   Saccharomyces boulardii 250 mg Oral capsule Indications: COPD with acute exacerbation (CMS HCC Code) Take 1 capsule by mouth 2 (two) times daily. 21 capsule      12/14/2023   Ordered Prescriptions- documented in this encounterReconcile with Patient's ChartOrdered PrescriptionsPrescription Sig Dispense Quantity Refills Last Filled Start Date End Date nebulizer accessories (ALL FLOW 3000 KIT) Misc Misc Indications: COPD with acute exacerbation (CMS HCC Code) 1 Units by Misc.(Non-Drug; Combo Route) route 4 (four) times daily.  1 each      12/14/2023   Saccharomyces boulardii 250 mg Oral capsule Indications: COPD with acute exacerbation (CMS HCC Code) Take 1 capsule by mouth 2 (two) times daily. 21 capsule      12/14/2023   predniSONE 20 MG Oral tablet Indications: COPD with acute exacerbation (CMS HCC Code) Take 2 tablets by mouth daily. 6 tablet      12/15/2023   nicotine  14 mg/24 hr TD Indications: Cigarette nicotine  dependence without complication Place 1 patch onto the skin daily. 30 patch  1   12/14/2023   guaiFENesin 600 mg Oral 12 hr EXT REL tab Indications: COPD with acute exacerbation (CMS HCC Code) Take 1 tablet by mouth 2 (two) times daily. 30 tablet      12/14/2023   ipratropium-albuteroL  0.5 mg-3 mg(2.5 mg base)/3 mL Inhl nebulizer solution Indications: COPD with acute exacerbation (CMS  HCC Code) Take 3 mLs by nebulization 4 (four) times daily. 28 each      12/14/2023   doxycycline  100 MG Oral capsule Indications: COPD with acute exacerbation (CMS HCC Code) Take 1 capsule by mouth 2 (two) times daily. 14 capsule      12/14/2023   cefpodoxime 200 MG Oral tablet Indications: COPD with acute exacerbation (CMS HCC Code) Take 1 tablet by mouth 2 (two) times daily. 14 tablet      12/14/2023   benzonatate 200 MG Oral capsule Indications: COPD with acute exacerbation (CMS HCC Code) Take 1 capsule by mouth 3 (three) times daily as needed for Cough. 42 capsule      12/14/2023   nebulizer accessories (ALL FLOW 3000 KIT) Misc Misc Indications: COPD with acute exacerbation (CMS HCC Code) 1 Units by Misc.(Non-Drug; Combo Route) route 4 (four) times daily. 1 each      12/14/2023 12/14/2023 nebulizer accessories (ALL FLOW 3000 KIT) Misc Misc Indications: COPD with acute exacerbation (CMS HCC Code) 1 Units by Misc.(Non-Drug; Combo Route) route 4 (four) times daily. 1 each      12/14/2023 12/14/2023 Future Appointments: 8/15 at 2:40 pm with Dr. Hobson appointment scheduled: [facility]lyn POST DISCHARGE CONTACT 3 OUTREACHRisk of Admission or ED Visit  Current as of yesterday    28% 40 to 100%: High Risk 20 to < 40%: Medium Risk 0 to < 20%: Low Risk   Last Change:     This score indicates an adult patient's 1-year risk, as a percentage, of a hospital admission or ED visit.     Current PCP:  Keller Janine [name], MD       Care Management Intervention: Care Manager Intervention Completed   Spoke To:: patient  Are you feeling as good as you did when you left the hospital?: Yes   Has home care agency contacted patient yet?: N/A  If equipment was ordered at time of discharge, does the patient have this in the home?: Yes (nebulizer)   Is the patient aware of pending orders / testing? : N/A  Do you have any questions about your  discharge instructions?: No  Do you have an appointment scheduled with your PCP and know the date and time of that appointment?: Yes  Do you have transportation to get to that appointment?: Yes  Do you have all of your medications listed on your discharge summary and are you taking them as they are written on the bottle?: Yes   Discharge medications reviewed and reconciled against outpatient MEDICAL RECORD NUMBER: Yes   Reviewed medications with:: patient  Time spent on the call with the  patient:: 10 minutes  Patient Care Management assessment and plan of care/next steps:: Writer called and spoke to [place] today.  Shontia is doing well now that she is home.  She does have her discharge medications.  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  She is using her nebulizer and stated Cigarettes are gone. She is aware of the follow up appointment on 8/15 at 2:40 pm with Dr. Janine.  Encouraged her to call [facility] if she needs anything.

## 2023-12-15 NOTE — Patient Instructions (Signed)
 We care about you and your health and are monitoring your health now that you're home from the hospital.  I will be your Transitional Care Manager and will be managing your care up to the next 30 days.   As I review your progress during this period of transition at home, you will see a Patient Outreach After Visit Summary in MyChart as a result of direct phone calls with you, or my review of your patient chart. This is a standard of care and there is no charge for this service.       Please feel free to reach out to me with any questions related to this.     If you have any urgent requests or issues, please call your doctor's office.     Katrine Coho, RN

## 2023-12-22 ENCOUNTER — Encounter: Payer: Self-pay | Admitting: Primary Care

## 2023-12-22 ENCOUNTER — Ambulatory Visit: Payer: Medicare (Managed Care) | Admitting: Primary Care

## 2023-12-22 ENCOUNTER — Other Ambulatory Visit: Payer: Self-pay

## 2023-12-22 VITALS — BP 112/62 | HR 78 | Temp 97.5°F | Ht 59.0 in | Wt 165.0 lb

## 2023-12-22 DIAGNOSIS — J189 Pneumonia, unspecified organism: Secondary | ICD-10-CM

## 2023-12-22 DIAGNOSIS — J449 Chronic obstructive pulmonary disease, unspecified: Secondary | ICD-10-CM

## 2023-12-22 MED ORDER — NICOTINE 14 MG/24HR TD PT24 *I*
1.0000 | MEDICATED_PATCH | TRANSDERMAL | 2 refills | Status: AC
Start: 2023-12-22 — End: ?

## 2023-12-22 NOTE — Progress Notes (Signed)
 Julia Burgess is a 63 y.o. adult ([birth year])Nurses note: Discharge Follow Up [facility] follow up, TCM )CC: Julia Burgess presents today for a follow-up evaluation after recent hospitalization for pneumonia.HPI: Julia Burgess is a 63 year old female patient with a noted history of COPD and is currently a smoker presented to the local hospital because of an acute exacerbation of her COPD and a possible diagnosis of left lower lobe pneumonia.  Noted to have an elevated WBC and an abnormal chest x-ray suggestive of left lower lobe pneumonia.  Patient treated with oral Vantin, doxycycline  and prednisone tapering dose.  She presents today for follow-up care.  Feels better but still has an occasional productive cough.  No associated fevers, chills or night sweats.ROSShe denies any fevers, chills or night sweats, nausea, vomiting, changes in bowel or bladder function.  She does not have chest pain or shortness of breath she does admit to production of sputum.Allergies[1]Current Outpatient Medications Medication  levothyroxine  (SYNTHROID , LEVOTHROID) 75 mcg tablet  oxyCODONE  (ROXICODONE ) 10 mg immediate release tablet  REPATHA SURECLICK 140 MG/ML pen  LEVALBUTEROL  45 MCG/ACT inhaler  atorvastatin  (LIPITOR) 40 mg tablet  rivaroxaban  (XARELTO ) 20 mg tablet  baclofen 10 mg tablet  omeprazole  (PRILOSEC) 40 mg capsule  EPINEPHrine (EPIPEN) 0.3 mg/0.3 mL auto-injector  naloxone  (NARCAN ) 4 mg/0.1 mL nasal spray  olmesartan  (BENICAR ) 20 mg tablet  calcium  carbonate 600 MG chewable tablet  oxybutynin (DITROPAN) 5 mg tablet  gabapentin (NEURONTIN) 300 mg capsule  psyllium (METAMUCIL) 28.3 % POWD powder  cholecalciferol (VITAMIN D ) 1,000 unit tablet  ascorbic acid (VITAMIN C) 100 MG tablet  Cyanocobalamin  (VITAMIN B12) 1000 MCG TBCR  Multiple Vitamins-Minerals (CENTRUM SILVER  50+WOMEN) TABS  dronabinol (MARINOL) 2.5 MG capsule  nicotine  (NICODERM CQ ) 14 MG/24HR patch  albuterol   HFA (PROVENTIL , VENTOLIN , PROAIR  HFA) 108 (90 Base) MCG/ACT inhaler No current facility-administered medications for this visit. Past Medical History[2]Social History[3]Vitals:  12/22/23 1456 BP: 112/62 Pulse: 78 Temp: 36.4 C (97.5 F) Weight: 74.8 kg (165 lb) Height: 1.499 m (4' 11) Body mass index is 33.33 kg/m.Physical Exam On examination, blood pressure is 112/62 with a pulse of 78 and the patient is afebrile and weighs 165 pounds with a BMI of 33%.  Head and neck examination is unremarkable for oral lesion or thyromegaly.  Chest exam shows rhonchi to the left lower lobe with decreased breath sounds bilaterally but no wheezes.  Heart exam is regular rate and rhythm.  The rest of the exam is noncontributory.Assessment/Plan1. PneumoniaResolving left lower lobe pneumonia.  Will repeat CBC.  Encouraged her to use an incentive spirometer.  1 was given to her in today's visit.  Also encouraged to continue current medication.- CBC; Future2. Chronic obstructive pulmonary disease, unspecified COPD typeHistory of COPD.  Now using nicotine  transdermal system as a aid to stop smoking.  A refill of this medication was given to the patient on today's visit.No follow-ups on file.[facility]: Transition Care Management Face-to-Face Visit Date of today's visit: 12/22/2023 Patient 's hospital discharge date: 8/7/2025I have reviewed the transition care management contact note dated, 12/15/2023 I have reviewed the discharge summaryI have performed a medication reconciliation   [1] AllergiesAllergen Reactions  Bee Venom Anaphylaxis  Copaxone [Glatiramer Acetate] Anaphylaxis  Glatiramer Anaphylaxis  Ketorolac Other (See Comments)   Muscle spasms  Meperidine Nausea And Vomiting and Other (See Comments)  Prochlorperazine Other (See Comments)   myoclonic  Tilactase Other (See Comments)   GI  upset [2] Past Medical History:Diagnosis Date  Anxiety disorder 01/29/2021  Arthritis   Asthma  Chronic hypokalemia   Chronic low back pain   Constipation 01/29/2021  COPD (chronic obstructive pulmonary disease)   Depression   DVT (deep venous thrombosis)   Dysphagia   GERD (gastroesophageal reflux disease)   Heart disease   Hypercholesterolemia   Hypertension   Hypothyroidism   Influenza A 07/06/2023  Multiple sclerosis   Multiple sclerosis, primary progressive   Osteoarthritis   Recurrent major depression 01/29/2021  Tobacco abuse   Tongue ulcer   Uncomplicated asthma 01/29/2021  Vitamin D  insufficiency 10/17/2022 [3] Social HistorySocioeconomic History  Marital status: Married Tobacco Use  Smoking status: Every Day   Packs/day: 0.50   Years: 35.00   Additional pack years: 0.00   Total pack years: 17.50   Types: Cigarettes   Start date: 04/08/1980   Passive exposure: Current  Smokeless tobacco: Never Substance and Sexual Activity  Alcohol use: Yes   Alcohol/week: 2.0 - 3.0 standard drinks of alcohol   Types: 2 - 3 Standard drinks or equivalent per week  Drug use: Never  Sexual activity: Not Currently   Partners: Male   Birth control/protection: Post-menopausal

## 2024-01-01 ENCOUNTER — Other Ambulatory Visit: Payer: Self-pay | Admitting: Primary Care

## 2024-01-01 NOTE — Telephone Encounter (Signed)
 Julia Burgess was last seen: 8/15/2025Next scheduled office visit:  She also needs albuterol  inhaler-she said it is 90 mcgs.  I do not see this on her medlist.

## 2024-01-01 NOTE — Telephone Encounter (Signed)
 I-Stop completed, last filled: 7/29/25Last office visit: 8/15/2025Scheduled office visit:

## 2024-01-02 MED ORDER — LEVALBUTEROL TARTRATE 45 MCG/ACT IN AERO *I*
2.0000 | INHALATION_SPRAY | RESPIRATORY_TRACT | 2 refills | Status: DC | PRN
Start: 2024-01-02 — End: 2024-02-09

## 2024-01-02 MED ORDER — OXYCODONE HCL 10 MG PO TABS *I*
10.0000 mg | ORAL_TABLET | Freq: Every day | ORAL | 0 refills | Status: DC
Start: 2024-01-02 — End: 2024-01-29

## 2024-01-29 ENCOUNTER — Other Ambulatory Visit: Payer: Self-pay | Admitting: Primary Care

## 2024-01-29 NOTE — Telephone Encounter (Signed)
 Julia Burgess was last seen: 8/15/2025Next scheduled office visit:

## 2024-01-29 NOTE — Telephone Encounter (Signed)
 I-Stop completed, last filled: 8/27/25Last office visit: 8/15/2025Scheduled office visit:

## 2024-01-31 MED ORDER — OXYCODONE HCL 10 MG PO TABS *I*
10.0000 mg | ORAL_TABLET | Freq: Every day | ORAL | 0 refills | Status: DC
Start: 2024-01-31 — End: 2024-02-26

## 2024-02-09 ENCOUNTER — Other Ambulatory Visit: Payer: Self-pay | Admitting: Primary Care

## 2024-02-09 NOTE — Telephone Encounter (Signed)
 Julia Burgess was last seen: 8/15/2025Next scheduled office visit:

## 2024-02-16 ENCOUNTER — Encounter: Payer: Self-pay | Admitting: Internal Medicine

## 2024-02-17 ENCOUNTER — Other Ambulatory Visit: Payer: Self-pay

## 2024-02-17 ENCOUNTER — Ambulatory Visit
Admission: RE | Admit: 2024-02-17 | Discharge: 2024-02-17 | Disposition: A | Discharge status: Home / Self Care | Payer: Medicare (Managed Care) | Source: Ambulatory Visit | Attending: Pulmonology | Admitting: Pulmonology

## 2024-02-17 DIAGNOSIS — F17210 Nicotine dependence, cigarettes, uncomplicated: Secondary | ICD-10-CM | POA: Insufficient documentation

## 2024-02-17 DIAGNOSIS — Z122 Encounter for screening for malignant neoplasm of respiratory organs: Secondary | ICD-10-CM | POA: Insufficient documentation

## 2024-02-18 ENCOUNTER — Other Ambulatory Visit: Payer: Self-pay | Admitting: Primary Care

## 2024-02-19 NOTE — Telephone Encounter (Signed)
 Julia Burgess was last seen: 8/15/2025Next scheduled office visit:

## 2024-02-26 ENCOUNTER — Encounter: Payer: Self-pay | Admitting: Pulmonology

## 2024-02-26 ENCOUNTER — Other Ambulatory Visit: Payer: Self-pay | Admitting: Pulmonology

## 2024-02-26 ENCOUNTER — Ambulatory Visit: Payer: Self-pay | Admitting: Pulmonology

## 2024-02-26 ENCOUNTER — Other Ambulatory Visit: Payer: Self-pay | Admitting: Primary Care

## 2024-02-26 DIAGNOSIS — Z122 Encounter for screening for malignant neoplasm of respiratory organs: Secondary | ICD-10-CM

## 2024-02-26 DIAGNOSIS — F17210 Nicotine dependence, cigarettes, uncomplicated: Secondary | ICD-10-CM

## 2024-02-26 NOTE — Telephone Encounter (Signed)
 Julia Burgess was last seen: 08/15/2025Next scheduled office visit:

## 2024-02-26 NOTE — Telephone Encounter (Signed)
 I-Stop completed, last filled: 9/25/25Last office visit: 8/15/25Scheduled office visit:

## 2024-02-29 ENCOUNTER — Other Ambulatory Visit: Payer: Self-pay | Admitting: Primary Care

## 2024-02-29 LAB — CBC
Hematocrit: 41.9 % (ref 37.5–47.7)
Hemoglobin: 13.6 g/dL (ref 12.1–15.8)
MCH: 32.4 pg (ref 26.4–33.6)
MCHC: 32.5 g/dL (ref 31.9–37.3)
MCV: 100 fL — ABNORMAL HIGH (ref 80–93)
Mean Platelet Volume: 8.9 fL — ABNORMAL LOW (ref 10.1–10.4)
Nucl RBC # K/uL: 0 x10 3/uL (ref 0.0–0.012)
Nucl RBC %: 0 % (ref 0.0–0.2)
Platelets: 408 10*3 — ABNORMAL HIGH (ref 144–366)
RBC Distribution Width-SD: 54.8 fL — ABNORMAL HIGH (ref 41.0–45.3)
RBC: 4.2 10*6 (ref 4.16–5.34)
RDW: 14.9 % — ABNORMAL HIGH (ref 12.8–14.2)
WBC: 8.26 10*3 (ref 4.3–11.0)

## 2024-02-29 NOTE — Telephone Encounter (Signed)
 Patient called today stating she will be out of medication tomorrow.

## 2024-03-01 MED ORDER — OXYCODONE HCL 10 MG PO TABS *I*
10.0000 mg | ORAL_TABLET | Freq: Every day | ORAL | 0 refills | Status: DC
Start: 2024-03-01 — End: 2024-03-28

## 2024-03-20 ENCOUNTER — Encounter: Payer: Self-pay | Admitting: Gastroenterology

## 2024-03-27 ENCOUNTER — Other Ambulatory Visit: Payer: Self-pay | Admitting: Primary Care

## 2024-03-27 ENCOUNTER — Encounter: Payer: Self-pay | Admitting: Primary Care

## 2024-03-27 NOTE — Progress Notes (Signed)
 Patient wanted Oxycodone  refilled but needs an appt. Thanks!

## 2024-03-27 NOTE — Progress Notes (Signed)
 Attempted to call patient to schedule appointment, vm was full

## 2024-03-27 NOTE — Telephone Encounter (Signed)
 Julia Burgess was last seen: 8/15/2025Next scheduled office visit:

## 2024-03-28 MED ORDER — OXYCODONE HCL 10 MG PO TABS *I*: 10.0000 mg | ORAL_TABLET | Freq: Every day | ORAL | 0 refills | Status: DC

## 2024-03-28 MED ORDER — OMEPRAZOLE 40 MG PO CPDR *I*
40.0000 mg | DELAYED_RELEASE_CAPSULE | Freq: Two times a day (BID) | ORAL | 1 refills | Status: AC
Start: 2024-03-28 — End: ?

## 2024-03-28 NOTE — Progress Notes (Addendum)
 Called patient and scheduled her for an appointment 04/12/24 at 1pm. Added medication to already existing refill request.

## 2024-03-28 NOTE — Telephone Encounter (Signed)
 I-Stop completed, last filled: 10/24/25Last office visit: 8/15/2025Scheduled office visit: 04/12/2024

## 2024-03-28 NOTE — Telephone Encounter (Signed)
 Aerabella was last seen: 8/15/2025Next scheduled office visit: Future Encounters    04/12/2024  1:00 PM Sch  FOLLOW UP VISIT  Authorization not required  PCW  Persaud, Keller [name], MD        Can we fill this in the meantime?? Patient scheduled an appointment for 04/12/24 @ 1pm

## 2024-04-04 ENCOUNTER — Other Ambulatory Visit: Payer: Self-pay | Admitting: Primary Care

## 2024-04-08 NOTE — Telephone Encounter (Signed)
 Julia Burgess was last seen: 8/15/2025Next scheduled office visit: Future Encounters    04/12/2024  1:00 PM Sch  FOLLOW UP VISIT  Authorization not required  PCW  Persaud, Keller [name], MD

## 2024-04-12 ENCOUNTER — Ambulatory Visit: Payer: Medicare (Managed Care) | Admitting: Primary Care

## 2024-04-12 ENCOUNTER — Other Ambulatory Visit: Payer: Self-pay

## 2024-04-16 ENCOUNTER — Ambulatory Visit: Payer: Medicare (Managed Care) | Admitting: Primary Care

## 2024-04-16 ENCOUNTER — Other Ambulatory Visit: Payer: Self-pay

## 2024-04-16 VITALS — BP 148/74 | HR 73 | Temp 97.8°F | Ht 59.0 in | Wt 173.0 lb

## 2024-04-16 DIAGNOSIS — R32 Unspecified urinary incontinence: Secondary | ICD-10-CM

## 2024-04-16 DIAGNOSIS — E039 Hypothyroidism, unspecified: Secondary | ICD-10-CM

## 2024-04-16 DIAGNOSIS — B370 Candidal stomatitis: Secondary | ICD-10-CM

## 2024-04-16 DIAGNOSIS — Z2882 Immunization not carried out because of caregiver refusal: Secondary | ICD-10-CM

## 2024-04-16 DIAGNOSIS — M545 Low back pain, unspecified: Secondary | ICD-10-CM

## 2024-04-16 DIAGNOSIS — Z1239 Encounter for other screening for malignant neoplasm of breast: Secondary | ICD-10-CM

## 2024-04-16 DIAGNOSIS — I10 Essential (primary) hypertension: Secondary | ICD-10-CM

## 2024-04-16 DIAGNOSIS — E119 Type 2 diabetes mellitus without complications: Secondary | ICD-10-CM

## 2024-04-16 DIAGNOSIS — G35D Multiple sclerosis, unspecified: Secondary | ICD-10-CM

## 2024-04-16 DIAGNOSIS — E785 Hyperlipidemia, unspecified: Secondary | ICD-10-CM

## 2024-04-16 MED ORDER — NYSTATIN 100000 UNIT/ML MT SUSP *I*: 500000.0000 [IU] | Freq: Four times a day (QID) | OROMUCOSAL | 0 refills | Status: AC

## 2024-04-16 MED ORDER — VIBEGRON 75 MG PO TABS *A*: 75.0000 mg | ORAL_TABLET | Freq: Every day | ORAL | 1 refills | Status: AC

## 2024-04-16 NOTE — Progress Notes (Signed)
 Julia Burgess is a 63 y.o. female (1960/05/24)Nurses note: Medication Refill (Med refill, /benzonatate (TESSALON) 200 mg capsule /oxyCODONE  (ROXICODONE ) 10 mg immediate release tablet)CC: Misty presents today for reevaluation.  Asking to refill her pain medication.HPI: 63 year old female patient with a history of hypertension, dyslipidemia, COPD, possible asthma, multiple sclerosis, chronic pain syndrome, lumbar spine stenosis status post lumbar spine fusion.  History noted for hypothyroidism, osteoporosis, stress fracture of the sacrum, s/p total left hip arthroplasty, dysphagia and chronic smoker.  She complains of worsening urinary incontinence despite Ditropan.  I have referred patient to urology.Current medicines are acetaminophen , furosemide, ipratropium/albuterol  via nebulizer, Ocrevus, levo albuterol , omeprazole , oxycodone , Xarelto , levothyroxine , Repatha, atorvastatin , baclofen, naloxone , only Sartan, gabapentin, calcium  carbonate, vitamin D3, vitamin C, vitamin B12, multivitamins, 5 background and Marinol.  I reviewed her recent CT of the lungs that showed no significant change in her nodularities with a low incidence of malignancy.  Recommended to have annual LDCT.IProcedure Component Value Ref Range Date/Time CT LUNG SCREEN - ANNUAL -LungRADS 1 or 2 [[id number]] Collected: 02/17/24 1307 Order Status: Completed Updated: 02/22/24 0751 Narrative:   02/17/2024 1:07 PMCT LUNG SCREENINGCLINICAL INFORMATION:  Z12.2-Encounter for screening for malignant neoplasm of respiratory organs. F17.210-Nicotine  dependence, cigarettes, uncomplicated.COMPARISON: 02/13/2023 CT lung screening.Current Smoker:  Yes  If former smoker, year quit:Pack-years:  35  Smoking history obtained from the referring providers office.PROCEDURE: Lung cancer screening protocol was performed at reduced radiation dose without intravenous contrast media. This low dose lung cancer screening  protocol is not intended to be diagnostic for vascular structures, lymph nodes, soft tissues and solid organs. Automated exposure control, adjustment of the mA and/or kV according to patient size, and/or iterative reconstruction techniques were utilized for radiation dose optimization.FINDINGS:Pulmonary:  Nodules: Stable 17 x 9 mm central LEFT lower lobe groundglass opacity (series 3, image 230).Stable 3.5 mm posterior LEFT lower lobe nodule (image 93).Stable presumed subsegmental bronchial mucus impaction in the lateral LEFT lower lobe (image 171).Additional nodules are annotated on the key images in PACS.No suspicious new or enlarging nodules or masses are identified.Emphysema: None.Other: Additional nonspecific mild patchy and linear densities bilaterally, likely relating to atelectasis and/or scarring.Lymph Nodes: No lymphadenopathy.Cardiovascular:Coronary artery calcification: Mild.Other: None.Upper Abdomen: Unremarkable.Bones: Degenerative changes. Stable presumed vertebral hemangiomas at T5, T8, T11 and T12.Other: None. Impression:   Lung-RADS Category: 2 - Benign Appearance or Behavior. Nodules with a very low likelihood of becoming a clinically active cancer due to size or lack of growth.Management: Continue annual low dose CT lung screening in 12 months if the patient continues to meet screening inclusion criteriaLung-RADS Modifier: None.S-Modifier recommendation: N/AEND OF REPORT ROSIs unremarkable with exception of frequent urinary incontinence, diffuse low back pain, hip pains and diffuse peripheral arthralgias.  Lab results: 10/23/251030 WBC 8.26 Hemoglobin 13.6 Hematocrit 41.9 RBC 4.20 Platelets 408* Allergies[1]Current Outpatient Medications Medication  acetaminophen  (TYLENOL ) 500 mg tablet  benzonatate (TESSALON) 200 mg capsule  Collagen Hydrolysate POWD  furosemide 20 mg tablet   guaiFENesin (MUCINEX) 600 MG 12 hr tablet  ipratropium-albuterol  (DUONEB) 0.5-2.5mg  /59mL nebulizer solution  ocrelizumab (OCREVUS) 300 MG/10ML injection  LEVALBUTEROL  45 MCG/ACT inhaler  omeprazole  (PRILOSEC) 40 mg capsule  oxyCODONE  (ROXICODONE ) 10 mg immediate release tablet  XARELTO  20 MG tablet  nicotine  (NICODERM CQ ) 14 MG/24HR patch  levothyroxine  (SYNTHROID , LEVOTHROID) 75 mcg tablet  REPATHA SURECLICK 140 MG/ML pen  atorvastatin  (LIPITOR) 40 mg tablet  baclofen 10 mg tablet  EPINEPHrine (EPIPEN) 0.3 mg/0.3 mL auto-injector  naloxone  (NARCAN ) 4 mg/0.1 mL nasal spray  olmesartan  (BENICAR ) 20 mg tablet  calcium  carbonate  600 MG chewable tablet  gabapentin (NEURONTIN) 300 mg capsule  psyllium (METAMUCIL) 28.3 % POWD powder  cholecalciferol (VITAMIN D ) 1,000 unit tablet  ascorbic acid (VITAMIN C) 100 MG tablet  Cyanocobalamin  (VITAMIN B12) 1000 MCG TBCR  Multiple Vitamins-Minerals (CENTRUM SILVER  50+WOMEN) TABS  dronabinol (MARINOL) 2.5 MG capsule  vibegron  (GEMTESA ) 75 MG tablet  nystatin  (MYCOSTATIN ) 100,000 Units/mL suspension No current facility-administered medications for this visit. Past Medical History[2]Social History[3]Vitals:  04/16/24 0832 BP: 148/74 Pulse: 73 Temp: 36.6 C (97.8 F) Weight: 78.5 kg (173 lb) Height: 1.499 m (4' 11) Body mass index is 34.94 kg/m.Physical Exam On examination, blood pressure is 148/74 with a pulse of 73 the patient is afebrile and weighs 173 pounds with a BMI of 34%.  Her head neck examination is unremarkable for oral lesions or thyromegaly or bruits.  Noted to have white patches on the back of her oropharynx.  Chest exam shows scattered rhonchi with decreased breath sounds bilaterally and heart exam is regular in rate and rhythm.  Her abdomen is soft and nontender and extremity shows no clubbing, cyanosis nor edema.  The patient uses a rolling walker for ambulation.  She has noted  bilateral hip and lumbar spine tenderness.  She has some spasticity of the paraspinal muscles, bilaterallyAssessment/Plan1. Urinary incontinenceUrinary incontinence.  Could be from MS versus progressive deterioration in bladder function.  I will refer patient to urology and switch to Viagra 1 daily.- AMB REFERRAL TO [facility]OLOGY - [facility]2. Multiple sclerosisHistory of multiple sclerosis but this appears to be stable.  Continue current medications.3. Hypothyroidism, unspecified typeStable hypothyroidism will continue current dose of levothyroxine .  Repeat thyroid  function studies.- Comprehensive metabolic panel; Future- TSH; Future- Microalbumin, Urine, Random; Future- Iron; Future- Ferritin; Future- TIBC; Future4. DyslipidemiaShe has a history of dyslipidemia.  Repeat lipid profile.  Will continue atorvastatin  and Repatha.- Comprehensive metabolic panel; Future- Lipid Panel (Reflex to Direct  LDL if Triglycerides more than 400); Future- Hemoglobin A1c; Future- Microalbumin, Urine, Random; Future- Iron; Future- Ferritin; Future- Vitamin B12; Future5. HBP (high blood pressure)Blood pressure appears to be elevated on today's visit.  Will continue to monitor and recommend continuation of current dose of olmesartan .  I advised patient on low-salt diet.- Comprehensive metabolic panel; Future6. DM (diabetes mellitus)Well-controlled diabetes mellitus.  Will check fasting glucose and A1c.  Will recommend she continue a low carbohydrate low sugar diet.  Will repeat A1c and fasting glucose to see if she needs adjustments in her medications.- Hemoglobin A1c; Future7. Low back painOf chronic low back pain.  Receives oral pain medication and muscle relaxants on a regular basis8. Parent refuses immunizationsUnfortunately, refusing all vaccinations9. Encounter for screening for malignant neoplasm of breast, unspecified screening  modalityShe is in need of mammography.10. Oral thrushPresence of oral candidiasis will treat with nystatin .No follow-ups on file. [1] AllergiesAllergen Reactions  Bee Venom Anaphylaxis  Copaxone [Glatiramer Acetate] Anaphylaxis  Glatiramer Anaphylaxis  Ketorolac Other (See Comments)   Muscle spasms  Meperidine Nausea And Vomiting and Other (See Comments)  Prochlorperazine Other (See Comments)   myoclonic  Tilactase Other (See Comments)   GI upset [2] Past Medical History:Diagnosis Date  Anxiety disorder 01/29/2021  Arthritis   Asthma   Chronic hypokalemia   Chronic low back pain   Constipation 01/29/2021  COPD (chronic obstructive pulmonary disease)   Depression   DVT (deep venous thrombosis)   Dysphagia   GERD (gastroesophageal reflux disease)   Heart disease   Hypercholesterolemia   Hypertension   Hypothyroidism   Influenza A 07/06/2023  Multiple  sclerosis   Multiple sclerosis, primary progressive   Osteoarthritis   Recurrent major depression 01/29/2021  Tobacco abuse   Tongue ulcer   Uncomplicated asthma 01/29/2021  Vitamin D  insufficiency 10/17/2022 [3] Social HistorySocioeconomic History  Marital status: Married Tobacco Use  Smoking status: Every Day   Current packs/day: 0.50   Average packs/day: 0.9 packs/day for 44.4 years (37.9 ttl pk-yrs)   Types: Cigarettes   Start date: 11/24/1979   Passive exposure: Current  Smokeless tobacco: Never  Tobacco comments:   The data in the grid above may be an average based on historical data and used for Lung Cancer Screening Eligibility.  If you find it inaccurate you can delete it and take a more accurate history. Substance and Sexual Activity  Alcohol use: Yes   Alcohol/week: 2.0 - 3.0 standard drinks of alcohol   Types: 2 - 3 Standard drinks or equivalent per week  Drug use: Never  Sexual activity: Not Currently    Partners: Male   Birth control/protection: Post-menopausal

## 2024-04-22 ENCOUNTER — Other Ambulatory Visit: Payer: Self-pay | Admitting: Primary Care

## 2024-04-22 NOTE — Telephone Encounter (Signed)
 I-Stop completed, last filled: 11/22/25Last office visit: 12/9/2025Scheduled office visit: Future Encounters    08/13/2024  1:40 PM Sch  FOLLOW UP VISIT  Authorization not required  PCW  Persaud, Keller [name], MD

## 2024-04-25 ENCOUNTER — Telehealth: Payer: Self-pay | Admitting: Primary Care

## 2024-04-25 NOTE — Telephone Encounter (Signed)
 Patient is upset that her refill is not ready. Patient stated that she is out of medication. Advised patient the refill is just waiting for signature. Patient wants a call back today from a nurse.

## 2024-04-26 MED ORDER — OXYCODONE HCL 10 MG PO TABS *I*: 10.0000 mg | ORAL_TABLET | Freq: Every day | ORAL | 0 refills | Status: DC

## 2024-04-26 NOTE — Telephone Encounter (Signed)
 Patient called upset that she has called everyday on this medication, patient also states she never received a call back yesterday. She would like this addressed today or she fears she will be in the hospital

## 2024-04-27 ENCOUNTER — Other Ambulatory Visit: Payer: Self-pay | Admitting: Primary Care

## 2024-05-24 ENCOUNTER — Telehealth: Payer: Self-pay | Admitting: Primary Care

## 2024-05-24 NOTE — Telephone Encounter (Signed)
 Julia Burgess was last seen: 12/9/2025Next scheduled office visit: Future Encounters    08/13/2024  1:40 PM [facility]  FOLLOW UP VISIT  Authorization not required  PCW  Persaud, Keller [name], MD

## 2024-05-28 MED ORDER — OXYCODONE HCL 10 MG PO TABS *I*: 10.0000 mg | ORAL_TABLET | Freq: Every day | ORAL | 0 refills | Status: DC

## 2024-05-28 NOTE — Telephone Encounter (Signed)
 I-Stop completed, last filled: 12/21/25Last office visit: 12/9/2025Scheduled office visit: Future Encounters    08/13/2024  1:40 PM Sch  FOLLOW UP VISIT  Authorization not required  PCW  Persaud, Keller [name], MD

## 2024-06-02 ENCOUNTER — Other Ambulatory Visit: Payer: Self-pay | Admitting: Primary Care

## 2024-06-03 ENCOUNTER — Other Ambulatory Visit: Payer: Self-pay | Admitting: Primary Care

## 2024-06-03 MED ORDER — OXYCODONE HCL 10 MG PO TABS *I*: 10.0000 mg | ORAL_TABLET | Freq: Every day | ORAL | 0 refills | Status: AC

## 2024-06-03 NOTE — Telephone Encounter (Signed)
 Julia Burgess was last seen: 12/9/2025Next scheduled office visit: Future Encounters    08/13/2024  1:40 PM [facility]  FOLLOW UP VISIT  Authorization not required  PCW  Persaud, Keller [name], MD

## 2024-06-03 NOTE — Telephone Encounter (Signed)
 Please see note. Patient only received a 7 day supply due to insurance. Script is pending if OK to refill.I-Stop completed, last filled: 1/20/26Last office visit: 12/9/2025Scheduled office visit: Future Encounters    08/13/2024  1:40 PM Sch  FOLLOW UP VISIT  Authorization not required  PCW  Persaud, Keller [name], MD

## 2024-06-03 NOTE — Telephone Encounter (Signed)
 Patient changed insurance and her new insurance only allowed a 7 day fill on medication and going forward will fill for 30 days at a time.. Patient will be out of medication tomorrow.Julia Burgess was last seen: 12/9/2025Next scheduled office visit: Future Encounters    08/13/2024  1:40 PM Sch  FOLLOW UP VISIT  Authorization not required  PCW  Persaud, Keller [name], MD

## 2024-06-24 ENCOUNTER — Ambulatory Visit: Payer: Medicare (Managed Care)

## 2024-08-13 ENCOUNTER — Ambulatory Visit: Payer: Medicare (Managed Care) | Admitting: Primary Care
